# Patient Record
Sex: Female | Born: 1990 | Race: Black or African American | Hispanic: No | Marital: Single | Smoking: Never smoker
Health system: Southern US, Community
[De-identification: ages and names within clinical notes are randomized; demographics above are authoritative.]

## PROBLEM LIST (undated history)

## (undated) ENCOUNTER — Inpatient Hospital Stay (HOSPITAL_COMMUNITY): Payer: Self-pay

## (undated) ENCOUNTER — Ambulatory Visit (HOSPITAL_COMMUNITY): Discharge status: Against Medical Advice | Payer: Medicaid Other

## (undated) DIAGNOSIS — D649 Anemia, unspecified: Secondary | ICD-10-CM

## (undated) DIAGNOSIS — F419 Anxiety disorder, unspecified: Secondary | ICD-10-CM

## (undated) DIAGNOSIS — R112 Nausea with vomiting, unspecified: Secondary | ICD-10-CM

## (undated) DIAGNOSIS — Z9889 Other specified postprocedural states: Secondary | ICD-10-CM

## (undated) DIAGNOSIS — K08409 Partial loss of teeth, unspecified cause, unspecified class: Secondary | ICD-10-CM

## (undated) DIAGNOSIS — Z8659 Personal history of other mental and behavioral disorders: Secondary | ICD-10-CM

## (undated) DIAGNOSIS — F32A Depression, unspecified: Secondary | ICD-10-CM

## (undated) DIAGNOSIS — Z8619 Personal history of other infectious and parasitic diseases: Secondary | ICD-10-CM

## (undated) DIAGNOSIS — Z8759 Personal history of other complications of pregnancy, childbirth and the puerperium: Secondary | ICD-10-CM

## (undated) HISTORY — PX: NO PAST SURGERIES: SHX2092

## (undated) HISTORY — DX: Anxiety disorder, unspecified: F41.9

## (undated) HISTORY — PX: TONSILLECTOMY: SUR1361

---
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
## 2010-04-06 ENCOUNTER — Emergency Department (HOSPITAL_COMMUNITY): Admission: EM | Admit: 2010-04-06 | Discharge: 2010-04-06 | Payer: Self-pay | Admitting: Emergency Medicine

## 2010-10-12 ENCOUNTER — Emergency Department (HOSPITAL_COMMUNITY): Admission: EM | Admit: 2010-10-12 | Discharge: 2010-10-12 | Payer: Self-pay | Admitting: Emergency Medicine

## 2011-01-01 ENCOUNTER — Emergency Department (HOSPITAL_COMMUNITY)
Admission: EM | Admit: 2011-01-01 | Discharge: 2011-01-01 | Payer: Self-pay | Source: Home / Self Care | Admitting: Family Medicine

## 2011-01-08 LAB — POCT URINALYSIS DIPSTICK
Bilirubin Urine: NEGATIVE
Hgb urine dipstick: NEGATIVE
Ketones, ur: NEGATIVE mg/dL
Nitrite: NEGATIVE
Protein, ur: 30 mg/dL — AB
Specific Gravity, Urine: 1.015 (ref 1.005–1.030)
Urine Glucose, Fasting: NEGATIVE mg/dL
Urobilinogen, UA: 2 mg/dL — ABNORMAL HIGH (ref 0.0–1.0)
pH: 7.5 (ref 5.0–8.0)

## 2011-01-08 LAB — POCT I-STAT, CHEM 8
BUN: <3 mg/dL — ABNORMAL LOW (ref 6–23)
Calcium, Ion: 1.19 mmol/L (ref 1.12–1.32)
Chloride: 101 mEq/L (ref 96–112)
Creatinine, Ser: 0.6 mg/dL (ref 0.4–1.2)
Glucose, Bld: 72 mg/dL (ref 70–99)
HCT: 40 % (ref 36.0–46.0)
Hemoglobin: 13.6 g/dL (ref 12.0–15.0)
Potassium: 3.7 mEq/L (ref 3.5–5.1)
Sodium: 142 mEq/L (ref 135–145)
TCO2: 31 mmol/L (ref 0–100)

## 2011-01-08 LAB — POCT PREGNANCY, URINE
Preg Test, Ur: NEGATIVE
Preg Test, Ur: NEGATIVE

## 2011-01-08 LAB — TSH: TSH: 1.2 u[IU]/mL (ref 0.350–4.500)

## 2011-03-14 LAB — DIFFERENTIAL
Basophils Absolute: 0 10*3/uL (ref 0.0–0.1)
Basophils Relative: 0 % (ref 0–1)
Eosinophils Absolute: 1 10*3/uL — ABNORMAL HIGH (ref 0.0–0.7)
Eosinophils Relative: 12 % — ABNORMAL HIGH (ref 0–5)
Lymphocytes Relative: 22 % (ref 12–46)
Lymphs Abs: 1.8 10*3/uL (ref 0.7–4.0)
Monocytes Absolute: 0.3 10*3/uL (ref 0.1–1.0)
Monocytes Relative: 4 % (ref 3–12)
Neutro Abs: 5 10*3/uL (ref 1.7–7.7)
Neutrophils Relative %: 62 % (ref 43–77)

## 2011-03-14 LAB — CBC
HCT: 37 % (ref 36.0–46.0)
Hemoglobin: 12.1 g/dL (ref 12.0–15.0)
MCHC: 32.8 g/dL (ref 30.0–36.0)
MCV: 90.4 fL (ref 78.0–100.0)
Platelets: 262 10*3/uL (ref 150–400)
RBC: 4.09 MIL/uL (ref 3.87–5.11)
RDW: 13.8 % (ref 11.5–15.5)
WBC: 8.1 10*3/uL (ref 4.0–10.5)

## 2011-03-14 LAB — POCT PREGNANCY, URINE: Preg Test, Ur: NEGATIVE

## 2011-03-19 ENCOUNTER — Inpatient Hospital Stay (INDEPENDENT_AMBULATORY_CARE_PROVIDER_SITE_OTHER)
Admission: RE | Admit: 2011-03-19 | Discharge: 2011-03-19 | Disposition: A | Discharge status: Home / Self Care | Payer: Self-pay | Source: Ambulatory Visit | Attending: Family Medicine | Admitting: Family Medicine

## 2011-03-19 DIAGNOSIS — J309 Allergic rhinitis, unspecified: Secondary | ICD-10-CM

## 2011-03-19 LAB — POCT PREGNANCY, URINE: Preg Test, Ur: NEGATIVE

## 2011-08-09 ENCOUNTER — Inpatient Hospital Stay (INDEPENDENT_AMBULATORY_CARE_PROVIDER_SITE_OTHER)
Admission: RE | Admit: 2011-08-09 | Discharge: 2011-08-09 | Disposition: A | Discharge status: Home / Self Care | Payer: 59 | Source: Ambulatory Visit | Attending: Emergency Medicine | Admitting: Emergency Medicine

## 2011-08-09 DIAGNOSIS — Z331 Pregnant state, incidental: Secondary | ICD-10-CM

## 2011-08-09 LAB — POCT PREGNANCY, URINE: Preg Test, Ur: POSITIVE

## 2011-08-28 ENCOUNTER — Encounter (HOSPITAL_COMMUNITY): Payer: Self-pay

## 2011-08-28 ENCOUNTER — Inpatient Hospital Stay (HOSPITAL_COMMUNITY)
Admission: AD | Admit: 2011-08-28 | Discharge: 2011-08-28 | Disposition: A | Discharge status: Home / Self Care | Payer: 59 | Source: Ambulatory Visit | Attending: Obstetrics | Admitting: Obstetrics

## 2011-08-28 ENCOUNTER — Inpatient Hospital Stay (HOSPITAL_COMMUNITY): Payer: 59

## 2011-08-28 DIAGNOSIS — O219 Vomiting of pregnancy, unspecified: Secondary | ICD-10-CM

## 2011-08-28 DIAGNOSIS — Z3400 Encounter for supervision of normal first pregnancy, unspecified trimester: Secondary | ICD-10-CM

## 2011-08-28 DIAGNOSIS — O99891 Other specified diseases and conditions complicating pregnancy: Secondary | ICD-10-CM | POA: Insufficient documentation

## 2011-08-28 DIAGNOSIS — O210 Mild hyperemesis gravidarum: Secondary | ICD-10-CM | POA: Insufficient documentation

## 2011-08-28 DIAGNOSIS — J45909 Unspecified asthma, uncomplicated: Secondary | ICD-10-CM | POA: Insufficient documentation

## 2011-08-28 LAB — ABO/RH: ABO/RH(D): A POS

## 2011-08-28 LAB — CBC
HCT: 33.8 % — ABNORMAL LOW (ref 36.0–46.0)
Hemoglobin: 11.4 g/dL — ABNORMAL LOW (ref 12.0–15.0)
MCH: 29.1 pg (ref 26.0–34.0)
MCHC: 33.7 g/dL (ref 30.0–36.0)
MCV: 86.2 fL (ref 78.0–100.0)
Platelets: 265 10*3/uL (ref 150–400)
RBC: 3.92 MIL/uL (ref 3.87–5.11)
RDW: 12.5 % (ref 11.5–15.5)
WBC: 6.7 10*3/uL (ref 4.0–10.5)

## 2011-08-28 LAB — WET PREP, GENITAL
Clue Cells Wet Prep HPF POC: NONE SEEN
Trich, Wet Prep: NONE SEEN

## 2011-08-28 LAB — URINALYSIS, ROUTINE W REFLEX MICROSCOPIC
Bilirubin Urine: NEGATIVE
Glucose, UA: NEGATIVE mg/dL
Hgb urine dipstick: NEGATIVE
Ketones, ur: >80 mg/dL — AB
Leukocytes, UA: NEGATIVE
Nitrite: NEGATIVE
Protein, ur: NEGATIVE mg/dL
Specific Gravity, Urine: >1.03 — ABNORMAL HIGH (ref 1.005–1.030)
Urobilinogen, UA: 0.2 mg/dL (ref 0.0–1.0)
pH: 6 (ref 5.0–8.0)

## 2011-08-28 LAB — HCG, QUANTITATIVE, PREGNANCY: hCG, Beta Chain, Quant, S: 109233 m[IU]/mL — ABNORMAL HIGH (ref <5)

## 2011-08-28 MED ORDER — ONDANSETRON 8 MG PO TBDP
8.0000 mg | ORAL_TABLET | Freq: Once | ORAL | Status: AC
  Administered 2011-08-28: 8 mg via ORAL
  Filled 2011-08-28: qty 1

## 2011-08-28 NOTE — Progress Notes (Signed)
Pt states that she has had a constant pain in the R mid abdomen above the waist level on her side for about 24 hours. Nausea, vomited only once 9-3. Slight white discharge.

## 2011-08-28 NOTE — ED Provider Notes (Signed)
History     Chief Complaint  Patient presents with  . Abdominal Pain   HPI Bailey Bailey 20 y.o. LMP 06-04-11   Comes for evaluation of right side pain which is intermittent.  Last episode of pain was while she was sitting in the lobby.  Has first prenatal care visit tomorrow.  Has been vomiting today and has not had any food today.   OB History    Grav Para Term Preterm Abortions TAB SAB Ect Mult Living   1               Past Medical History  Diagnosis Date  . Asthma     Past Surgical History  Procedure Date  . No past surgeries     No family history on file.  History  Substance Use Topics  . Smoking status: Never Smoker   . Smokeless tobacco: Not on file  . Alcohol Use: No    Allergies: Allergies not on file  No prescriptions prior to admission    Review of Systems  Gastrointestinal: Positive for nausea, vomiting and abdominal pain.  Genitourinary:       No vaginal discharge. No vaginal bleeding. No dysuria.     Physical Exam   Blood pressure 112/68, pulse 82, temperature 98.7 F (37.1 C), temperature source Oral, resp. rate 16, height 5\' 5"  (1.651 m), weight 163 lb 6.4 oz (74.118 kg), last menstrual period 06/04/2011, SpO2 100.00%.  Physical Exam  Nursing note and vitals reviewed. Constitutional: She is oriented to person, place, and time. She appears well-developed and well-nourished.  HENT:  Head: Normocephalic.  Eyes: EOM are normal.  Neck: Neck supple.  GI: Soft. There is no tenderness. There is no rebound and no guarding.       Unable to palpate fundus and unable to hear FHT with doppler  Genitourinary:       Speculum exam: Vulva:  Small fissure noted at intersection of mons and labia majora - HSV culture done Vagina - Small amount of creamy discharge, no odor Cervix - No contact bleeding Bimanual exam: Cervix closed Uterus non tender, normal size Adnexa non tender, no masses bilaterally GC/Chlam, wet prep done Chaperone present for  exam.    Musculoskeletal: Normal range of motion.  Neurological: She is alert and oriented to person, place, and time.  Skin: Skin is warm and dry.  Psychiatric: She has a normal mood and affect.  Client now states she is unsure of LMP - ?  July 11 vs.  June 11.  MAU Course  Procedures  *RADIOLOGY REPORT*  Clinical Data: Intermittent right side pain. By LMP, the patient is  7 weeks 6 days with EDC by LMP of 04/09/2012.  OBSTETRIC <14 WK [place] AND TRANSVAGINAL OB US  Technique: Both transabdominal and transvaginal ultrasound  examinations were performed for complete evaluation of the  gestation as well as the maternal uterus, adnexal regions, and  pelvic cul-de-sac. Transvaginal technique was performed to assess  early pregnancy.  Comparison: None.  Intrauterine gestational sac: Visualized/normal in shape.  Yolk sac: Present  Embryo: Present  Cardiac Activity: Present  Heart Rate: 170 bpm  CRL: 16.7 mm 8 w 0 d [place] EDC: 04/08/2012  Maternal uterus/adnexae:  The cervix has a normal appearance by transvaginal exam. The left  ovary is normal appearance. The right ovary is normal in  appearance. Within the left adnexal region, there is a 1.9 x 0.9 x  1.0 simple cyst, separate from the ovary. This may represent  a  peritoneal inclusion cyst or bowel duplication cyst. Alternatively,  this could represent a para ovarian cyst.  IMPRESSION:  1. Single living intrauterine embryo corresponding to an age of 7  weeks 6 days. Size of these correlate well.  2. Incidental note of benign appearing left adnexal simple cyst,  discrete from the ovary.   MDM   Assessment and Plan  Viable pregnancy  Plan: Keep your appointment to begin prenatal care tomorrow. Let your provider know you were here tonight.  Sol Odor 08/28/2011, 8:34 PM   Nolene Bernheim, NP 08/28/11 2308

## 2011-08-29 LAB — GC/CHLAMYDIA PROBE AMP, GENITAL
Chlamydia, DNA Probe: NEGATIVE
GC Probe Amp, Genital: NEGATIVE

## 2011-08-29 LAB — RPR: RPR: NONREACTIVE

## 2011-08-29 LAB — RUBELLA ANTIBODY, IGM: Rubella: IMMUNE

## 2011-08-31 LAB — HERPES SIMPLEX VIRUS CULTURE: Culture: NOT DETECTED

## 2011-10-06 DIAGNOSIS — N949 Unspecified condition associated with female genital organs and menstrual cycle: Secondary | ICD-10-CM

## 2011-10-06 DIAGNOSIS — K5900 Constipation, unspecified: Secondary | ICD-10-CM

## 2011-11-07 ENCOUNTER — Encounter (HOSPITAL_COMMUNITY): Payer: Self-pay | Admitting: *Deleted

## 2011-11-07 ENCOUNTER — Inpatient Hospital Stay (HOSPITAL_COMMUNITY)
Admission: AD | Admit: 2011-11-07 | Discharge: 2011-11-07 | Disposition: A | Discharge status: Home / Self Care | Payer: 59 | Source: Ambulatory Visit | Attending: Obstetrics and Gynecology | Admitting: Obstetrics and Gynecology

## 2011-11-07 DIAGNOSIS — O99891 Other specified diseases and conditions complicating pregnancy: Secondary | ICD-10-CM | POA: Insufficient documentation

## 2011-11-07 DIAGNOSIS — N949 Unspecified condition associated with female genital organs and menstrual cycle: Secondary | ICD-10-CM

## 2011-11-07 DIAGNOSIS — K5900 Constipation, unspecified: Secondary | ICD-10-CM | POA: Insufficient documentation

## 2011-11-07 DIAGNOSIS — R1032 Left lower quadrant pain: Secondary | ICD-10-CM | POA: Insufficient documentation

## 2011-11-07 NOTE — Progress Notes (Signed)
Pt c/o LLQ pain since yesterday, hurts more with movement. No vaginal bleeding. Pt states she hasn't had a BM in "3 weeks".

## 2011-11-07 NOTE — ED Provider Notes (Signed)
History     Chief Complaint  Patient presents with  . Abdominal Pain   HPI Bailey Bailey 20 y.o. 18w 0d Comes to MAU tonight with periodic stabbing LLQ pain which has now resolved.  Has prenatal care appointment in the office tomorrow.  Currently feels fine and wants to go home.  Has taken Zofran earlier in her pregnancy and has not had a BM in 2 weeks.   OB History    Grav Para Term Preterm Abortions TAB SAB Ect Mult Living   1               Past Medical History  Diagnosis Date  . Asthma   . No pertinent past medical history     Past Surgical History  Procedure Date  . No past surgeries     No family history on file.  History  Substance Use Topics  . Smoking status: Never Smoker   . Smokeless tobacco: Not on file  . Alcohol Use: No    Allergies: No Known Allergies  Prescriptions prior to admission  Medication Sig Dispense Refill  . Prenatal Vit-Fe Fumarate-FA (PRENAPLUS PO) Take 1 tablet by mouth daily.        Marland Kitchen acetaminophen (TYLENOL) 500 MG tablet Take 500-1,000 mg by mouth every 6 (six) hours as needed. For pain      . albuterol (PROVENTIL HFA;VENTOLIN HFA) 108 (90 BASE) MCG/ACT inhaler Inhale 2 puffs into the lungs every 6 (six) hours as needed.          Review of Systems  Gastrointestinal: Positive for constipation. Negative for abdominal pain.  Genitourinary: Negative for dysuria.       No vaginal bleeding, no vaginal discharge   Physical Exam   Blood pressure 110/85, pulse 74, temperature 97.8 F (36.6 C), temperature source Oral, resp. rate 20, height 5\' 6"  (1.676 m), weight 140 lb (63.504 kg), last menstrual period 06/04/2011.  Physical Exam  Nursing note and vitals reviewed. Constitutional: She is oriented to person, place, and time. She appears well-developed and well-nourished.  HENT:  Head: Normocephalic.  Eyes: EOM are normal.  Neck: Neck supple.  GI: Soft. There is no tenderness.       Fundus 1 FB below umbilicus, FHT 156    Musculoskeletal: Normal range of motion.  Neurological: She is alert and oriented to person, place, and time.  Skin: Skin is warm and dry.  Psychiatric: She has a normal mood and affect.    MAU Course  Procedures  MDM Pain has resolved currently  Assessment and Plan  Round ligament pain Constipation  Plan Keep appointment in the office tomorrow Call your doctor if you have questions or concerns. You may want to take Miralax by the package directions to assist with having a BM.  BURLESON,TERRI 11/07/2011, 8:29 PM   Nolene Bernheim, NP 11/07/11 2034

## 2011-12-26 ENCOUNTER — Encounter (HOSPITAL_COMMUNITY): Payer: Self-pay | Admitting: *Deleted

## 2011-12-26 ENCOUNTER — Inpatient Hospital Stay (HOSPITAL_COMMUNITY)
Admission: AD | Admit: 2011-12-26 | Discharge: 2011-12-26 | Disposition: A | Discharge status: Home / Self Care | Payer: 59 | Source: Ambulatory Visit | Attending: Obstetrics and Gynecology | Admitting: Obstetrics and Gynecology

## 2011-12-26 DIAGNOSIS — R109 Unspecified abdominal pain: Secondary | ICD-10-CM | POA: Insufficient documentation

## 2011-12-26 DIAGNOSIS — O99891 Other specified diseases and conditions complicating pregnancy: Secondary | ICD-10-CM | POA: Insufficient documentation

## 2011-12-26 DIAGNOSIS — O26899 Other specified pregnancy related conditions, unspecified trimester: Secondary | ICD-10-CM

## 2011-12-26 NOTE — Progress Notes (Signed)
Patient state she has been having sharp pain on the right abdomen. Is worse at night and with movement, but today has had two episodes of severe sharp pain not related to any activity. Patient denies any bleeding or leaking and reports good fetal movement.

## 2011-12-26 NOTE — ED Provider Notes (Signed)
History     Chief Complaint  Patient presents with  . Abdominal Pain   HPI Bailey Bailey 21 y.o. 25w 0d  Was having periodic pain in her sides earlier, but it has now stopped and she wants to go home.  Denies pain, denies dysuria.  Next appointment in the office is on the 14 or 16th.  Did not call the office today.  Has not taken any medication for pain today.  OB History    Grav Para Term Preterm Abortions TAB SAB Ect Mult Living   1               Past Medical History  Diagnosis Date  . Asthma   . No pertinent past medical history     Past Surgical History  Procedure Date  . No past surgeries     History reviewed. No pertinent family history.  History  Substance Use Topics  . Smoking status: Never Smoker   . Smokeless tobacco: Never Used  . Alcohol Use: No    Allergies: No Known Allergies  Prescriptions prior to admission  Medication Sig Dispense Refill  . Prenatal Vit-Fe Fumarate-FA (PRENATAL MULTIVITAMIN) TABS Take 1 tablet by mouth daily.        Marland Kitchen albuterol (PROVENTIL HFA;VENTOLIN HFA) 108 (90 BASE) MCG/ACT inhaler Inhale 2 puffs into the lungs every 6 (six) hours as needed. For shortness of breath/asthma        Review of Systems  Gastrointestinal: Negative for nausea, vomiting and diarrhea.  Genitourinary: Negative for dysuria.       Periodic pain in sides of abdomen No vaginal bleeding No leaking   Physical Exam   Blood pressure 114/60, pulse 102, temperature 98.8 F (37.1 C), temperature source Oral, resp. rate 16, height 5' 5.5" (1.664 m), weight 160 lb 12.8 oz (72.938 kg), last menstrual period 06/04/2011, SpO2 97.00%.  Physical Exam  Nursing note and vitals reviewed. Constitutional: She is oriented to person, place, and time. She appears well-developed and well-nourished.  HENT:  Head: Normocephalic.  Eyes: EOM are normal.  Neck: Neck supple.  GI: Soft. There is no tenderness.       FHT baseline 150.  Feeling good movement of baby.  No  contractions palpated or on the monitor strip.  Musculoskeletal: Normal range of motion.  Neurological: She is alert and oriented to person, place, and time.  Skin: Skin is warm and dry.  Psychiatric: She has a normal mood and affect.    MAU Course  Procedures  MDM Client's pain has resolved.  Consult with Dr. Marcelle Overlie re: plan of care  Assessment and Plan  Pregnancy 25 weeks  Plan Advised to call the office if pain is occuring during work week, daytime hours May also try pain relief measures - increased PO fluids, Tylenol po by package directions and call the office if continuing to have problems.  Bailey Bailey 12/26/2011, 4:31 PM   Nolene Bernheim, NP 12/26/11 1647

## 2012-01-14 ENCOUNTER — Inpatient Hospital Stay (HOSPITAL_COMMUNITY)
Admission: AD | Admit: 2012-01-14 | Discharge: 2012-01-14 | Disposition: A | Discharge status: Home / Self Care | Payer: 59 | Source: Ambulatory Visit | Attending: Obstetrics and Gynecology | Admitting: Obstetrics and Gynecology

## 2012-01-14 ENCOUNTER — Encounter (HOSPITAL_COMMUNITY): Payer: Self-pay | Admitting: *Deleted

## 2012-01-14 ENCOUNTER — Inpatient Hospital Stay (HOSPITAL_COMMUNITY): Payer: 59

## 2012-01-14 DIAGNOSIS — B3731 Acute candidiasis of vulva and vagina: Secondary | ICD-10-CM | POA: Insufficient documentation

## 2012-01-14 DIAGNOSIS — O2390 Unspecified genitourinary tract infection in pregnancy, unspecified trimester: Secondary | ICD-10-CM | POA: Insufficient documentation

## 2012-01-14 DIAGNOSIS — O26859 Spotting complicating pregnancy, unspecified trimester: Secondary | ICD-10-CM | POA: Insufficient documentation

## 2012-01-14 DIAGNOSIS — B373 Candidiasis of vulva and vagina: Secondary | ICD-10-CM

## 2012-01-14 HISTORY — DX: Anemia, unspecified: D64.9

## 2012-01-14 LAB — WET PREP, GENITAL
Clue Cells Wet Prep HPF POC: NONE SEEN
Trich, Wet Prep: NONE SEEN

## 2012-01-14 MED ORDER — FLUCONAZOLE 150 MG PO TABS: 150.0000 mg | ORAL_TABLET | Freq: Once | ORAL | Status: AC

## 2012-01-14 NOTE — Progress Notes (Signed)
Patient states she had a some pain last night that was more aching than cramping but it stopped. This am with wiping had a pink discharge on the tissue. No pain at this time. Reports movement normal for her baby for the morning.

## 2012-01-14 NOTE — Discharge Instructions (Signed)
Candida Infection, Adult A candida infection (also called yeast, fungus and Monilia infection) is an overgrowth of yeast that can occur anywhere on the body. A yeast infection commonly occurs in warm, moist body areas. Usually, the infection remains localized but can spread to become a systemic infection. A yeast infection may be a sign of a more severe disease such as diabetes, leukemia, or AIDS. A yeast infection can occur in both men and women. In women, Candida vaginitis is a vaginal infection. It is one of the most common causes of vaginitis. Men usually do not have symptoms or know they have an infection until other problems develop. Men may find out they have a yeast infection because their sex partner has a yeast infection. Uncircumcised men are more likely to get a yeast infection than circumcised men. This is because the uncircumcised glans is not exposed to air and does not remain as dry as that of a circumcised glans. Older adults may develop yeast infections around dentures. CAUSES  Women  Antibiotics.   Steroid medication taken for a long time.   Being overweight (obese).   Diabetes.   Poor immune condition.   Certain serious medical conditions.   Immune suppressive medications for organ transplant patients.   Chemotherapy.   Pregnancy.   Menstration.   Stress and fatigue.   Intravenous drug use.   Oral contraceptives.   Wearing tight-fitting clothes in the crotch area.   Catching it from a sex partner who has a yeast infection.   Spermicide.   Intravenous, urinary, or other catheters.  Men  Catching it from a sex partner who has a yeast infection.   Having oral or anal sex with a person who has the infection.   Spermicide.   Diabetes.   Antibiotics.   Poor immune system.   Medications that suppress the immune system.   Intravenous drug use.   Intravenous, urinary, or other catheters.  SYMPTOMS  Women  Thick, white vaginal discharge.    Vaginal itching.   Redness and swelling in and around the vagina.   Irritation of the lips of the vagina and perineum.   Blisters on the vaginal lips and perineum.   Painful sexual intercourse.   Low blood sugar (hypoglycemia).   Painful urination.   Bladder infections.   Intestinal problems such as constipation, indigestion, bad breath, bloating, increase in gas, diarrhea, or loose stools.  Men  Men may develop intestinal problems such as constipation, indigestion, bad breath, bloating, increase in gas, diarrhea, or loose stools.   Dry, cracked skin on the penis with itching or discomfort.   Jock itch.   Dry, flaky skin.   Athlete's foot.   Hypoglycemia.  DIAGNOSIS  Women  A history and an exam are performed.   The discharge may be examined under a microscope.   A culture may be taken of the discharge.  Men  A history and an exam are performed.   Any discharge from the penis or areas of cracked skin will be looked at under the microscope and cultured.   Stool samples may be cultured.  TREATMENT  Women  Vaginal antifungal suppositories and creams.   Medicated creams to decrease irritation and itching on the outside of the vagina.   Warm compresses to the perineal area to decrease swelling and discomfort.   Oral antifungal medications.   Medicated vaginal suppositories or cream for repeated or recurrent infections.   Wash and dry the irritation areas before applying the cream.     Eating yogurt with lactobacillus may help with prevention and treatment.   Sometimes painting the vagina with gentian violet solution may help if creams and suppositories do not work.  Men  Antifungal creams and oral antifungal medications.   Sometimes treatment must continue for 30 days after the symptoms go away to prevent recurrence.  HOME CARE INSTRUCTIONS  Women  Use cotton underwear and avoid tight-fitting clothing.   Avoid colored, scented toilet paper and  deodorant tampons or pads.   Do not douche.   Keep your diabetes under control.   Finish all the prescribed medications.   Keep your skin clean and dry.   Consume milk or yogurt with lactobacillus active culture regularly. If you get frequent yeast infections and think that is what the infection is, there are over-the-counter medications that you can get. If the infection does not show healing in 3 days, talk to your caregiver.   Tell your sex partner you have a yeast infection. Your partner may need treatment also, especially if your infection does not clear up or recurs.  Men  Keep your skin clean and dry.   Keep your diabetes under control.   Finish all prescribed medications.   Tell your sex partner that you have a yeast infection so they can be treated if necessary.  SEEK MEDICAL CARE IF:   Your symptoms do not clear up or worsen in one week after treatment.   You have an oral temperature above 102 F (38.9 C).   You have trouble swallowing or eating for a prolonged time.   You develop blisters on and around your vagina.   You develop vaginal bleeding and it is not your menstrual period.   You develop abdominal pain.   You develop intestinal problems as mentioned above.   You get weak or lightheaded.   You have painful or increased urination.   You have pain during sexual intercourse.  MAKE SURE YOU:   Understand these instructions.   Will watch your condition.   Will get help right away if you are not doing well or get worse.  Document Released: 01/17/2005 Document Revised: 08/22/2011 Document Reviewed: 05/01/2010 [employer] Patient Information 2012 [employer]. 

## 2012-01-14 NOTE — ED Provider Notes (Signed)
Bailey Bailey y.o.G1P0 @[redacted]w[redacted]d   SUBJECTIVE  HPI: Brought in by her mother for pink vaginal spotting this morning. Patient states that last night she had diffuse lower abdominal crampy pain for several hours but has no abdominal pain or cramps presently. This is the first episode of any vaginal bleeding. She states that her perineum has been moist and panties slightly wet for weeks but no gush of fluid. No antecedent intercourse. She has whitish vaginal discharge that is pruritic.Good fetal movement. No dysuria or hematuria.  Regular prenatal care with no complications.  Past Medical History  Diagnosis Date  . Asthma   . Anemia    Past Surgical History  Procedure Date  . No past surgeries    History   Social History  . Marital Status: Single    Spouse Name: N/A    Number of Children: N/A  . Years of Education: N/A   Occupational History  . Not on file.   Social History Main Topics  . Smoking status: Never Smoker   . Smokeless tobacco: Never Used  . Alcohol Use: No  . Drug Use: No  . Sexually Active: Not Currently     last intercourse months ago   Other Topics Concern  . Not on file   Social History Narrative  . No narrative on file   No current facility-administered medications on file prior to encounter.   Current Outpatient Prescriptions on File Prior to Encounter  Medication Sig Dispense Refill  . albuterol (PROVENTIL HFA;VENTOLIN HFA) 108 (90 BASE) MCG/ACT inhaler Inhale 2 puffs into the lungs every 6 (six) hours as needed. For shortness of breath/asthma      . Prenatal Vit-Fe Fumarate-FA (PRENATAL MULTIVITAMIN) TABS Take 1 tablet by mouth at bedtime.        No Known Allergies  ROS: Pertinent items in HPI  OBJECTIVE  BP 110/56  Pulse 88  Temp(Src) 98.9 F (37.2 C) (Oral)  Resp 16  Ht 5' 5.5" (1.664 m)  SpO2 99%  LMP 06/04/2011 Physical Exam  Constitutional: She is oriented to person, place, and time and well-developed, well-nourished, and in no  distress.  HENT:  Head: Normocephalic.  Neck: Neck supple.  Cardiovascular: Normal rate.   Pulmonary/Chest: Effort normal.  Abdominal: Soft. There is no tenderness.       Size consistent with dates  Genitourinary: Vaginal discharge found.       NEFG. Then vaginal discharge at introitus; fern negative. Speculum: Moderate amount adherent whitish yellow vaginal discharge swabbed from vault and cervix. Initially no blood seen cervix friable when touched with Q-tip. No lesion seen Cx: closed, 2.5 cm long  Musculoskeletal: Normal range of motion.  Neurological: She is alert and oriented to person, place, and time.  Skin: Skin is warm and dry.   Results for orders placed during the hospital encounter of 01/14/12 (from the past 24 hour(s))  WET PREP, GENITAL     Status: Abnormal   Collection Time   01/14/12  9:32 AM      Component Value Range   Yeast, Wet Prep MODERATE (*) NONE SEEN    Trich, Wet Prep NONE SEEN  NONE SEEN    Clue Cells, Wet Prep NONE SEEN  NONE SEEN    WBC, Wet Prep HPF POC FEW (*) NONE SEEN    Toco: No UCs FHR 145, 10bpm ACCELS  [place]: normal AFV, cx 3.5, no previa or abruption  ASSESSMENT G1 at [redacted]w[redacted]d Candida vaginitis with friable cx    PLAN Rx Diflucan  GC/CT sent Keep appt at office or cal office if bleeding recurs  Discussed with Dr. Marcelle Overlie

## 2012-03-06 ENCOUNTER — Encounter (HOSPITAL_COMMUNITY): Payer: Self-pay | Admitting: *Deleted

## 2012-03-06 ENCOUNTER — Inpatient Hospital Stay (HOSPITAL_COMMUNITY)
Admission: AD | Admit: 2012-03-06 | Discharge: 2012-03-06 | Disposition: A | Discharge status: Home / Self Care | Payer: 59 | Source: Ambulatory Visit | Attending: Obstetrics and Gynecology | Admitting: Obstetrics and Gynecology

## 2012-03-06 DIAGNOSIS — R1031 Right lower quadrant pain: Secondary | ICD-10-CM | POA: Insufficient documentation

## 2012-03-06 DIAGNOSIS — N949 Unspecified condition associated with female genital organs and menstrual cycle: Secondary | ICD-10-CM

## 2012-03-06 DIAGNOSIS — O99891 Other specified diseases and conditions complicating pregnancy: Secondary | ICD-10-CM | POA: Insufficient documentation

## 2012-03-06 LAB — URINALYSIS, ROUTINE W REFLEX MICROSCOPIC
Bilirubin Urine: NEGATIVE
Glucose, UA: NEGATIVE mg/dL
Hgb urine dipstick: NEGATIVE
Ketones, ur: NEGATIVE mg/dL
Protein, ur: NEGATIVE mg/dL

## 2012-03-06 NOTE — MAU Note (Signed)
Pt reports "sharp, sharp abd pain"  Off/on since yesterday. Denies problems with preg. Reports good fetal movement. Denies vaginal bleeding or fluid.

## 2012-03-06 NOTE — MAU Note (Signed)
Pt states she has been having pain since yesterday.Pt states she has the pain all the time, but it "is getting worse and worse"

## 2012-03-06 NOTE — MAU Provider Note (Signed)
Chief Complaint:  Abdominal Pain   Bailey Bailey is  21 y.o. G1P0.  Patient's last menstrual period was 06/04/2011..  [redacted]w[redacted]d   She presents complaining of Abdominal Pain  Presents with report of sharp shooting lower abd pain present for several days. Worse at night and with position changes. +FM, Denies blding, LOF, dysuria, nausea, vomiting, diarrhea, fever, chills, or trauma  Obstetrical/Gynecological History: OB History    Grav Para Term Preterm Abortions TAB SAB Ect Mult Living   1               Past Medical History: Past Medical History  Diagnosis Date  . Asthma   . Anemia     Past Surgical History: Past Surgical History  Procedure Date  . No past surgeries     Family History: Family History  Problem Relation Age of Onset  . Kidney disease Mother   . Migraines Mother   . Diabetes Father     Social History: History  Substance Use Topics  . Smoking status: Never Smoker   . Smokeless tobacco: Never Used  . Alcohol Use: No    Allergies: No Known Allergies  Prescriptions prior to admission  Medication Sig Dispense Refill  . albuterol (PROVENTIL HFA;VENTOLIN HFA) 108 (90 BASE) MCG/ACT inhaler Inhale 2 puffs into the lungs every 6 (six) hours as needed. For shortness of breath/asthma      . ferrous sulfate 325 (65 FE) MG tablet Take 325 mg by mouth daily with breakfast.      . Prenatal Vit-Fe Fumarate-FA (PRENATAL MULTIVITAMIN) TABS Take 1 tablet by mouth at bedtime.         Review of Systems - Negative except what has been reviewed in HPI  Physical Exam   Blood pressure 116/66, pulse 87, temperature 97.4 F (36.3 C), resp. rate 18, height 5\' 6"  (1.676 m), weight 183 lb (83.008 kg), last menstrual period 06/04/2011, SpO2 100.00%.  General: General appearance - alert, well appearing, and in no distress and oriented to person, place, and time, appears comfortable, smiling Mental status - alert, oriented to person, place, and time, normal mood, behavior,  speech, dress, motor activity, and thought processes, affect appropriate to mood Abdomen - gravid, non tender Focused Gynecological Exam: CERVIX: closed/thick/firm/posterior/vtx ,  FHR: 130, mod variability, +15x15, no decels Toco: occ UI, no ctx  Labs: Recent Results (from the past 24 hour(s))  URINALYSIS, ROUTINE W REFLEX MICROSCOPIC   Collection Time   03/06/12  8:20 PM      Component Value Range   Color, Urine YELLOW  YELLOW    APPearance CLEAR  CLEAR    Specific Gravity, Urine 1.010  1.005 - 1.030    pH 6.5  5.0 - 8.0    Glucose, UA NEGATIVE  NEGATIVE (mg/dL)   Hgb urine dipstick NEGATIVE  NEGATIVE    Bilirubin Urine NEGATIVE  NEGATIVE    Ketones, ur NEGATIVE  NEGATIVE (mg/dL)   Protein, ur NEGATIVE  NEGATIVE (mg/dL)   Urobilinogen, UA 0.2  0.0 - 1.0 (mg/dL)   Nitrite NEGATIVE  NEGATIVE    Leukocytes, UA NEGATIVE  NEGATIVE    Assessment: Round Ligament Pain  Plan: Discharge home Comfort measures reviewed FU as scheduled in the office, with Dr. Marcelle Overlie, as scheduled.  [name] E. 03/06/2012,9:28 PM

## 2012-03-06 NOTE — Discharge Instructions (Signed)

## 2012-03-08 ENCOUNTER — Inpatient Hospital Stay (HOSPITAL_COMMUNITY)
Admission: AD | Admit: 2012-03-08 | Discharge: 2012-03-08 | Disposition: A | Discharge status: Home / Self Care | Payer: 59 | Source: Ambulatory Visit | Attending: Obstetrics and Gynecology | Admitting: Obstetrics and Gynecology

## 2012-03-08 ENCOUNTER — Encounter (HOSPITAL_COMMUNITY): Payer: Self-pay | Admitting: Obstetrics and Gynecology

## 2012-03-08 DIAGNOSIS — Z3689 Encounter for other specified antenatal screening: Secondary | ICD-10-CM

## 2012-03-08 DIAGNOSIS — Z36 Encounter for antenatal screening of mother: Secondary | ICD-10-CM

## 2012-03-08 DIAGNOSIS — O368190 Decreased fetal movements, unspecified trimester, not applicable or unspecified: Secondary | ICD-10-CM | POA: Insufficient documentation

## 2012-03-08 NOTE — Discharge Instructions (Signed)
Pregnancy - Third Trimester The third trimester of pregnancy (the last 3 months) is a period of the most rapid growth for you and your baby. The baby approaches a length of 20 inches and a weight of 6 to 10 pounds. The baby is adding on fat and getting ready for life outside your body. While inside, babies have periods of sleeping and waking, suck their thumbs, and hiccups. You can often feel small contractions of the uterus. This is false labor. It is also called Braxton-Hicks contractions. This is like a practice for labor. The usual problems in this stage of pregnancy include more difficulty breathing, swelling of the hands and feet from water retention, and having to urinate more often because of the uterus and baby pressing on your bladder.  PRENATAL EXAMS  Blood work may continue to be done during prenatal exams. These tests are done to check on your health and the probable health of your baby. Blood work is used to follow your blood levels (hemoglobin). Anemia (low hemoglobin) is common during pregnancy. Iron and vitamins are given to help prevent this. You may also continue to be checked for diabetes. Some of the past blood tests may be done again.   The size of the uterus is measured during each visit. This makes sure your baby is growing properly according to your pregnancy dates.   Your blood pressure is checked every prenatal visit. This is to make sure you are not getting toxemia.   Your urine is checked every prenatal visit for infection, diabetes and protein.   Your weight is checked at each visit. This is done to make sure gains are happening at the suggested rate and that you and your baby are growing normally.   Sometimes, an ultrasound is performed to confirm the position and the proper growth and development of the baby. This is a test done that bounces harmless sound waves off the baby so your caregiver can more accurately determine due dates.   Discuss the type of pain  medication and anesthesia you will have during your labor and delivery.   Discuss the possibility and anesthesia if a Cesarean Section might be necessary.   Inform your caregiver if there is any mental or physical violence at home.  Sometimes, a specialized non-stress test, contraction stress test and biophysical profile are done to make sure the baby is not having a problem. Checking the amniotic fluid surrounding the baby is called an amniocentesis. The amniotic fluid is removed by sticking a needle into the belly (abdomen). This is sometimes done near the end of pregnancy if an early delivery is required. In this case, it is done to help make sure the baby's lungs are mature enough for the baby to live outside of the womb. If the lungs are not mature and it is unsafe to deliver the baby, an injection of cortisone medication is given to the mother 1 to 2 days before the delivery. This helps the baby's lungs mature and makes it safer to deliver the baby. CHANGES OCCURING IN THE THIRD TRIMESTER OF PREGNANCY Your body goes through many changes during pregnancy. They vary from person to person. Talk to your caregiver about changes you notice and are concerned about.  During the last trimester, you have probably had an increase in your appetite. It is normal to have cravings for certain foods. This varies from person to person and pregnancy to pregnancy.   You may begin to get stretch marks on your hips,   abdomen, and breasts. These are normal changes in the body during pregnancy. There are no exercises or medications to take which prevent this change.   Constipation may be treated with a stool softener or adding bulk to your diet. Drinking lots of fluids, fiber in vegetables, fruits, and whole grains are helpful.   Exercising is also helpful. If you have been very active up until your pregnancy, most of these activities can be continued during your pregnancy. If you have been less active, it is helpful  to start an exercise program such as walking. Consult your caregiver before starting exercise programs.   Avoid all smoking, alcohol, un-prescribed drugs, herbs and "street drugs" during your pregnancy. These chemicals affect the formation and growth of the baby. Avoid chemicals throughout the pregnancy to ensure the delivery of a healthy infant.   Backache, varicose veins and hemorrhoids may develop or get worse.   You will tire more easily in the third trimester, which is normal.   The baby's movements may be stronger and more often.   You may become short of breath easily.   Your belly button may stick out.   A yellow discharge may leak from your breasts called colostrum.   You may have a bloody mucus discharge. This usually occurs a few days to a week before labor begins.  HOME CARE INSTRUCTIONS   Keep your caregiver's appointments. Follow your caregiver's instructions regarding medication use, exercise, and diet.   During pregnancy, you are providing food for you and your baby. Continue to eat regular, well-balanced meals. Choose foods such as meat, fish, milk and other low fat dairy products, vegetables, fruits, and whole-grain breads and cereals. Your caregiver will tell you of the ideal weight gain.   A physical sexual relationship may be continued throughout pregnancy if there are no other problems such as early (premature) leaking of amniotic fluid from the membranes, vaginal bleeding, or belly (abdominal) pain.   Exercise regularly if there are no restrictions. Check with your caregiver if you are unsure of the safety of your exercises. Greater weight gain will occur in the last 2 trimesters of pregnancy. Exercising helps:   Control your weight.   Get you in shape for labor and delivery.   You lose weight after you deliver.   Rest a lot with legs elevated, or as needed for leg cramps or low back pain.   Wear a good support or jogging bra for breast tenderness during  pregnancy. This may help if worn during sleep. Pads or tissues may be used in the bra if you are leaking colostrum.   Do not use hot tubs, steam rooms, or saunas.   Wear your seat belt when driving. This protects you and your baby if you are in an accident.   Avoid raw meat, cat litter boxes and soil used by cats. These carry germs that can cause birth defects in the baby.   It is easier to loose urine during pregnancy. Tightening up and strengthening the pelvic muscles will help with this problem. You can practice stopping your urination while you are going to the bathroom. These are the same muscles you need to strengthen. It is also the muscles you would use if you were trying to stop from passing gas. You can practice tightening these muscles up 10 times a set and repeating this about 3 times per day. Once you know what muscles to tighten up, do not perform these exercises during urination. It is more likely   to cause an infection by backing up the urine.   Ask for help if you have financial, counseling or nutritional needs during pregnancy. Your caregiver will be able to offer counseling for these needs as well as refer you for other special needs.   Make a list of emergency phone numbers and have them available.   Plan on getting help from family or friends when you go home from the hospital.   Make a trial run to the hospital.   Take prenatal classes with the father to understand, practice and ask questions about the labor and delivery.   Prepare the baby's room/nursery.   Do not travel out of the city unless it is absolutely necessary and with the advice of your caregiver.   Wear only low or no heal shoes to have better balance and prevent falling.  MEDICATIONS AND DRUG USE IN PREGNANCY  Take prenatal vitamins as directed. The vitamin should contain 1 milligram of folic acid. Keep all vitamins out of reach of children. Only a couple vitamins or tablets containing iron may be fatal  to a baby or young child when ingested.   Avoid use of all medications, including herbs, over-the-counter medications, not prescribed or suggested by your caregiver. Only take over-the-counter or prescription medicines for pain, discomfort, or fever as directed by your caregiver. Do not use aspirin, ibuprofen (Motrin, Advil, Nuprin) or naproxen (Aleve) unless OK'd by your caregiver.   Let your caregiver also know about herbs you may be using.   Alcohol is related to a number of birth defects. This includes fetal alcohol syndrome. All alcohol, in any form, should be avoided completely. Smoking will cause low birth rate and premature babies.   Street/illegal drugs are very harmful to the baby. They are absolutely forbidden. A baby born to an addicted mother will be addicted at birth. The baby will go through the same withdrawal an adult does.  SEEK MEDICAL CARE IF: You have any concerns or worries during your pregnancy. It is better to call with your questions if you feel they cannot wait, rather than worry about them. DECISIONS ABOUT CIRCUMCISION You may or may not know the sex of your baby. If you know your baby is a boy, it may be time to think about circumcision. Circumcision is the removal of the foreskin of the penis. This is the skin that covers the sensitive end of the penis. There is no proven medical need for this. Often this decision is made on what is popular at the time or based upon religious beliefs and social issues. You can discuss these issues with your caregiver or pediatrician. SEEK IMMEDIATE MEDICAL CARE IF:   An unexplained oral temperature above 102 F (38.9 C) develops, or as your caregiver suggests.   You have leaking of fluid from the vagina (birth canal). If leaking membranes are suspected, take your temperature and tell your caregiver of this when you call.   There is vaginal spotting, bleeding or passing clots. Tell your caregiver of the amount and how many pads are  used.   You develop a bad smelling vaginal discharge with a change in the color from clear to white.   You develop vomiting that lasts more than 24 hours.   You develop chills or fever.   You develop shortness of breath.   You develop burning on urination.   You loose more than 2 pounds of weight or gain more than 2 pounds of weight or as suggested by your   caregiver.   You notice sudden swelling of your face, hands, and feet or legs.   You develop belly (abdominal) pain. Round ligament discomfort is a common non-cancerous (benign) cause of abdominal pain in pregnancy. Your caregiver still must evaluate you.   You develop a severe headache that does not go away.   You develop visual problems, blurred or double vision.   If you have not felt your baby move for more than 1 hour. If you think the baby is not moving as much as usual, eat something with sugar in it and lie down on your left side for an hour. The baby should move at least 4 to 5 times per hour. Call right away if your baby moves less than that.   You fall, are in a car accident or any kind of trauma.   There is mental or physical violence at home.  Document Released: 12/04/2001 Document Revised: 11/29/2011 Document Reviewed: 06/08/2009 [employer] Patient Information 2012 [employer]. 

## 2012-03-08 NOTE — MAU Provider Note (Signed)
Chief Complaint:  Decreased Fetal Movement   HPI  Bailey Bailey is  21 y.o. G1P0 at [redacted]w[redacted]d presents with decreased fetal movment.  She reports that she has not felt the baby move since midnight last night but she has been busy today and stressed.  She denies LOF, vaginal bleeding, cramping/contractions, h/a, visual disturbances, n/v, fever/chills, or urinary symptoms.   Obstetrical/Gynecological History: OB History    Grav Para Term Preterm Abortions TAB SAB Ect Mult Living   1               Past Medical History: Past Medical History  Diagnosis Date  . Asthma   . Anemia     Past Surgical History: Past Surgical History  Procedure Date  . No past surgeries     Family History: Family History  Problem Relation Age of Onset  . Kidney disease Mother   . Migraines Mother   . Diabetes Father   . Heart disease Maternal Grandmother 1    CHF    Social History: History  Substance Use Topics  . Smoking status: Never Smoker   . Smokeless tobacco: Never Used  . Alcohol Use: No    Allergies: No Known Allergies  Meds:  Prescriptions prior to admission  Medication Sig Dispense Refill  . albuterol (PROVENTIL HFA;VENTOLIN HFA) 108 (90 BASE) MCG/ACT inhaler Inhale 2 puffs into the lungs every 6 (six) hours as needed. For shortness of breath/asthma      . ferrous sulfate 325 (65 FE) MG tablet Take 325 mg by mouth daily with breakfast.      . Prenatal Vit-Fe Fumarate-FA (PRENATAL MULTIVITAMIN) TABS Take 1 tablet by mouth at bedtime.         Review of Systems Denies contractions, abdominal pain or vaginal bleeding. See HPI.     Physical Exam  Blood pressure 112/64, pulse 83, temperature 99.1 F (37.3 C), temperature source Oral, resp. rate 18, height 5\' 6"  (1.676 m), weight 83.553 kg (184 lb 3.2 oz), last menstrual period 06/04/2011. GENERAL: Well-developed, well-nourished female in no acute distress.  LUNGS: Clear to auscultation bilaterally.  HEART: Regular rate and  rhythm. ABDOMEN: Soft, nontender, nondistended, gravid.  EXTREMITIES: Nontender, no edema, 2+ distal pulses. Pelvic exam deferred  EFM for 45 minutes: FHR Category I with baseline 135, positive accels, negative decels, moderate variability Some irritability noted on toco, pt denies cramping/contractions   Labs: Not indicated  Imaging Studies:  Not indicated  Assessment/Plan: A: Reactive NST Pt reports good fetal movement in MAU  P: Called Dr Renaldo Fiddler to review pt presentation and NST results D/C home with labor precautions  Teaching done regarding fetal movement counting F/U with prenatal provider Return to MAU as needed    LEFTWICH-KIRBY, Najeh Credit 3/16/20138:40 PM

## 2012-03-08 NOTE — MAU Note (Signed)
"  I haven't felt the baby move since before midnight last night.  I have been very busy today and stressed out."

## 2012-03-17 LAB — STREP B DNA PROBE: GBS: NEGATIVE

## 2012-04-02 ENCOUNTER — Encounter (HOSPITAL_COMMUNITY): Payer: Self-pay | Admitting: *Deleted

## 2012-04-02 ENCOUNTER — Telehealth (HOSPITAL_COMMUNITY): Payer: Self-pay | Admitting: *Deleted

## 2012-04-02 NOTE — Telephone Encounter (Signed)
Preadmission screen  

## 2012-04-10 ENCOUNTER — Telehealth (HOSPITAL_COMMUNITY): Payer: Self-pay | Admitting: *Deleted

## 2012-04-10 NOTE — Telephone Encounter (Signed)
Preadmission screen  

## 2012-04-15 ENCOUNTER — Encounter (HOSPITAL_COMMUNITY): Payer: Self-pay

## 2012-04-15 ENCOUNTER — Inpatient Hospital Stay (HOSPITAL_COMMUNITY)
Admission: RE | Admit: 2012-04-15 | Discharge: 2012-04-19 | DRG: 766 | Disposition: A | Discharge status: Home / Self Care | Payer: 59 | Source: Ambulatory Visit | Attending: Obstetrics and Gynecology | Admitting: Obstetrics and Gynecology

## 2012-04-15 VITALS — BP 107/74 | HR 73 | Temp 97.9°F | Resp 18 | Ht 66.0 in | Wt 190.0 lb

## 2012-04-15 DIAGNOSIS — Z98891 History of uterine scar from previous surgery: Secondary | ICD-10-CM

## 2012-04-15 LAB — CBC
Hemoglobin: 12.5 g/dL (ref 12.0–15.0)
MCH: 29.5 pg (ref 26.0–34.0)
MCV: 91.7 fL (ref 78.0–100.0)
RBC: 4.24 MIL/uL (ref 3.87–5.11)

## 2012-04-15 MED ORDER — CITRIC ACID-SODIUM CITRATE 334-500 MG/5ML PO SOLN
30.0000 mL | ORAL | Status: DC | PRN
  Administered 2012-04-16: 30 mL via ORAL
  Filled 2012-04-15: qty 15

## 2012-04-15 MED ORDER — MISOPROSTOL 25 MCG QUARTER TABLET
25.0000 ug | ORAL_TABLET | ORAL | Status: DC | PRN
  Administered 2012-04-15: 25 ug via VAGINAL
  Filled 2012-04-15 (×2): qty 0.25

## 2012-04-15 MED ORDER — OXYCODONE-ACETAMINOPHEN 5-325 MG PO TABS: 1.0000 | ORAL_TABLET | ORAL | Status: DC | PRN

## 2012-04-15 MED ORDER — ONDANSETRON HCL 4 MG/2ML IJ SOLN: 4.0000 mg | Freq: Four times a day (QID) | INTRAMUSCULAR | Status: DC | PRN

## 2012-04-15 MED ORDER — LACTATED RINGERS IV SOLN
500.0000 mL | INTRAVENOUS | Status: DC | PRN
  Administered 2012-04-16 (×3): 300 mL via INTRAVENOUS

## 2012-04-15 MED ORDER — TERBUTALINE SULFATE 1 MG/ML IJ SOLN
0.2500 mg | Freq: Once | INTRAMUSCULAR | Status: AC | PRN
  Filled 2012-04-15: qty 1

## 2012-04-15 MED ORDER — ZOLPIDEM TARTRATE 10 MG PO TABS
10.0000 mg | ORAL_TABLET | Freq: Every evening | ORAL | Status: DC | PRN
  Administered 2012-04-15: 10 mg via ORAL
  Filled 2012-04-15: qty 1

## 2012-04-15 MED ORDER — ACETAMINOPHEN 325 MG PO TABS: 650.0000 mg | ORAL_TABLET | ORAL | Status: DC | PRN

## 2012-04-15 MED ORDER — LACTATED RINGERS IV SOLN
INTRAVENOUS | Status: DC
  Administered 2012-04-15 – 2012-04-16 (×4): via INTRAVENOUS

## 2012-04-15 MED ORDER — IBUPROFEN 600 MG PO TABS: 600.0000 mg | ORAL_TABLET | Freq: Four times a day (QID) | ORAL | Status: DC | PRN

## 2012-04-15 MED ORDER — OXYTOCIN BOLUS FROM INFUSION
500.0000 mL | Freq: Once | INTRAVENOUS | Status: DC
  Filled 2012-04-15: qty 500

## 2012-04-15 MED ORDER — BUTORPHANOL TARTRATE 2 MG/ML IJ SOLN
1.0000 mg | INTRAMUSCULAR | Status: DC | PRN
  Administered 2012-04-16 (×3): 1 mg via INTRAVENOUS
  Filled 2012-04-15 (×3): qty 1

## 2012-04-15 MED ORDER — OXYTOCIN 20 UNITS IN LACTATED RINGERS INFUSION - SIMPLE: 125.0000 mL/h | Freq: Once | INTRAVENOUS | Status: DC

## 2012-04-15 MED ORDER — FLEET ENEMA 7-19 GM/118ML RE ENEM: 1.0000 | ENEMA | RECTAL | Status: DC | PRN

## 2012-04-15 MED ORDER — LIDOCAINE HCL (PF) 1 % IJ SOLN: 30.0000 mL | INTRAMUSCULAR | Status: DC | PRN

## 2012-04-16 ENCOUNTER — Encounter (HOSPITAL_COMMUNITY): Payer: Self-pay | Admitting: Anesthesiology

## 2012-04-16 ENCOUNTER — Encounter (HOSPITAL_COMMUNITY): Payer: Self-pay

## 2012-04-16 ENCOUNTER — Inpatient Hospital Stay (HOSPITAL_COMMUNITY): Payer: 59 | Admitting: Anesthesiology

## 2012-04-16 ENCOUNTER — Encounter (HOSPITAL_COMMUNITY)
Admission: RE | Disposition: A | Discharge status: Home / Self Care | Payer: Self-pay | Source: Ambulatory Visit | Attending: Obstetrics and Gynecology

## 2012-04-16 LAB — RPR: RPR Ser Ql: NONREACTIVE

## 2012-04-16 SURGERY — Surgical Case
Anesthesia: Regional | Site: Abdomen | Wound class: Clean Contaminated

## 2012-04-16 MED ORDER — OXYTOCIN 10 UNIT/ML IJ SOLN
INTRAMUSCULAR | Status: AC
  Filled 2012-04-16: qty 4

## 2012-04-16 MED ORDER — OXYTOCIN 20 UNITS IN LACTATED RINGERS INFUSION - SIMPLE
1.0000 m[IU]/min | INTRAVENOUS | Status: DC
  Administered 2012-04-16: 2 m[IU]/min via INTRAVENOUS
  Filled 2012-04-16: qty 1000

## 2012-04-16 MED ORDER — MENTHOL 3 MG MT LOZG: 1.0000 | LOZENGE | OROMUCOSAL | Status: DC | PRN

## 2012-04-16 MED ORDER — NALBUPHINE HCL 10 MG/ML IJ SOLN
5.0000 mg | INTRAMUSCULAR | Status: DC | PRN
  Administered 2012-04-16 (×2): 10 mg via INTRAVENOUS
  Filled 2012-04-16 (×2): qty 1

## 2012-04-16 MED ORDER — MEPERIDINE HCL 25 MG/ML IJ SOLN
INTRAMUSCULAR | Status: AC
  Administered 2012-04-16: 6.25 mg via INTRAVENOUS
  Filled 2012-04-16: qty 1

## 2012-04-16 MED ORDER — DIPHENHYDRAMINE HCL 25 MG PO CAPS
25.0000 mg | ORAL_CAPSULE | ORAL | Status: DC | PRN
  Administered 2012-04-17 – 2012-04-18 (×2): 25 mg via ORAL
  Filled 2012-04-16: qty 1

## 2012-04-16 MED ORDER — OXYTOCIN 20 UNITS IN LACTATED RINGERS INFUSION - SIMPLE
INTRAVENOUS | Status: DC | PRN
  Administered 2012-04-16: 20 [IU] via INTRAVENOUS

## 2012-04-16 MED ORDER — MORPHINE SULFATE (PF) 0.5 MG/ML IJ SOLN
INTRAMUSCULAR | Status: DC | PRN
  Administered 2012-04-16: 2 mg via INTRAVENOUS

## 2012-04-16 MED ORDER — ALBUTEROL SULFATE HFA 108 (90 BASE) MCG/ACT IN AERS: 2.0000 | INHALATION_SPRAY | Freq: Four times a day (QID) | RESPIRATORY_TRACT | Status: DC | PRN

## 2012-04-16 MED ORDER — KETOROLAC TROMETHAMINE 30 MG/ML IJ SOLN: 30.0000 mg | Freq: Four times a day (QID) | INTRAMUSCULAR | Status: AC | PRN

## 2012-04-16 MED ORDER — NALOXONE HCL 0.4 MG/ML IJ SOLN: 0.4000 mg | INTRAMUSCULAR | Status: DC | PRN

## 2012-04-16 MED ORDER — TETANUS-DIPHTH-ACELL PERTUSSIS 5-2.5-18.5 LF-MCG/0.5 IM SUSP
0.5000 mL | Freq: Once | INTRAMUSCULAR | Status: AC
  Administered 2012-04-17: 0.5 mL via INTRAMUSCULAR
  Filled 2012-04-16: qty 0.5

## 2012-04-16 MED ORDER — KETOROLAC TROMETHAMINE 60 MG/2ML IM SOLN: 60.0000 mg | Freq: Once | INTRAMUSCULAR | Status: AC | PRN

## 2012-04-16 MED ORDER — DIPHENHYDRAMINE HCL 50 MG/ML IJ SOLN: 25.0000 mg | INTRAMUSCULAR | Status: DC | PRN

## 2012-04-16 MED ORDER — LIDOCAINE-EPINEPHRINE (PF) 2 %-1:200000 IJ SOLN
INTRAMUSCULAR | Status: AC
  Filled 2012-04-16: qty 20

## 2012-04-16 MED ORDER — NALBUPHINE HCL 10 MG/ML IJ SOLN
5.0000 mg | INTRAMUSCULAR | Status: DC | PRN
  Filled 2012-04-16: qty 1

## 2012-04-16 MED ORDER — MEPERIDINE HCL 25 MG/ML IJ SOLN
6.2500 mg | INTRAMUSCULAR | Status: AC | PRN
  Administered 2012-04-16: 6.25 mg via INTRAVENOUS
  Administered 2012-04-16: 12.5 mg via INTRAVENOUS

## 2012-04-16 MED ORDER — SODIUM CHLORIDE 0.9 % IJ SOLN: 3.0000 mL | Freq: Two times a day (BID) | INTRAMUSCULAR | Status: DC

## 2012-04-16 MED ORDER — BISACODYL 10 MG RE SUPP: 10.0000 mg | Freq: Every day | RECTAL | Status: DC | PRN

## 2012-04-16 MED ORDER — PRENATAL MULTIVITAMIN CH
1.0000 | ORAL_TABLET | Freq: Every day | ORAL | Status: DC
  Administered 2012-04-17 – 2012-04-19 (×3): 1 via ORAL
  Filled 2012-04-16 (×3): qty 1

## 2012-04-16 MED ORDER — PHENYLEPHRINE 40 MCG/ML (10ML) SYRINGE FOR IV PUSH (FOR BLOOD PRESSURE SUPPORT)
80.0000 ug | PREFILLED_SYRINGE | INTRAVENOUS | Status: DC | PRN
  Filled 2012-04-16: qty 5

## 2012-04-16 MED ORDER — SODIUM CHLORIDE 0.9 % IV SOLN: 250.0000 mL | INTRAVENOUS | Status: DC

## 2012-04-16 MED ORDER — NALBUPHINE HCL 10 MG/ML IJ SOLN
5.0000 mg | INTRAMUSCULAR | Status: DC | PRN
  Administered 2012-04-17: 10 mg via SUBCUTANEOUS
  Filled 2012-04-16: qty 1

## 2012-04-16 MED ORDER — ONDANSETRON HCL 4 MG/2ML IJ SOLN
INTRAMUSCULAR | Status: AC
  Filled 2012-04-16: qty 2

## 2012-04-16 MED ORDER — ONDANSETRON HCL 4 MG/2ML IJ SOLN: 4.0000 mg | INTRAMUSCULAR | Status: DC | PRN

## 2012-04-16 MED ORDER — FENTANYL 2.5 MCG/ML BUPIVACAINE 1/10 % EPIDURAL INFUSION (WH - ANES)
INTRAMUSCULAR | Status: DC | PRN
  Administered 2012-04-16: 14 mL/h via EPIDURAL

## 2012-04-16 MED ORDER — WITCH HAZEL-GLYCERIN EX PADS: 1.0000 "application " | MEDICATED_PAD | CUTANEOUS | Status: DC | PRN

## 2012-04-16 MED ORDER — CEFAZOLIN SODIUM 1-5 GM-% IV SOLN
INTRAVENOUS | Status: DC | PRN
  Administered 2012-04-16: 1 g via INTRAVENOUS

## 2012-04-16 MED ORDER — MORPHINE SULFATE 0.5 MG/ML IJ SOLN
INTRAMUSCULAR | Status: AC
  Filled 2012-04-16: qty 10

## 2012-04-16 MED ORDER — SODIUM BICARBONATE 8.4 % IV SOLN
INTRAVENOUS | Status: AC
  Filled 2012-04-16: qty 50

## 2012-04-16 MED ORDER — MEPERIDINE HCL 25 MG/ML IJ SOLN: 6.2500 mg | INTRAMUSCULAR | Status: DC | PRN

## 2012-04-16 MED ORDER — FLEET ENEMA 7-19 GM/118ML RE ENEM: 1.0000 | ENEMA | Freq: Every day | RECTAL | Status: DC | PRN

## 2012-04-16 MED ORDER — METOCLOPRAMIDE HCL 5 MG/ML IJ SOLN: 10.0000 mg | Freq: Three times a day (TID) | INTRAMUSCULAR | Status: DC | PRN

## 2012-04-16 MED ORDER — SODIUM CHLORIDE 0.9 % IJ SOLN: 3.0000 mL | INTRAMUSCULAR | Status: DC | PRN

## 2012-04-16 MED ORDER — KETOROLAC TROMETHAMINE 30 MG/ML IJ SOLN
30.0000 mg | Freq: Four times a day (QID) | INTRAMUSCULAR | Status: AC | PRN
  Administered 2012-04-17: 30 mg via INTRAVENOUS
  Filled 2012-04-16: qty 1

## 2012-04-16 MED ORDER — SODIUM CHLORIDE 0.9 % IV SOLN: 1.0000 ug/kg/h | INTRAVENOUS | Status: DC | PRN

## 2012-04-16 MED ORDER — DIPHENHYDRAMINE HCL 50 MG/ML IJ SOLN: 12.5000 mg | INTRAMUSCULAR | Status: DC | PRN

## 2012-04-16 MED ORDER — IBUPROFEN 600 MG PO TABS: 600.0000 mg | ORAL_TABLET | Freq: Four times a day (QID) | ORAL | Status: DC | PRN

## 2012-04-16 MED ORDER — DIBUCAINE 1 % RE OINT: 1.0000 "application " | TOPICAL_OINTMENT | RECTAL | Status: DC | PRN

## 2012-04-16 MED ORDER — ONDANSETRON HCL 4 MG PO TABS
4.0000 mg | ORAL_TABLET | ORAL | Status: DC | PRN
  Administered 2012-04-19: 4 mg via ORAL
  Filled 2012-04-16: qty 1

## 2012-04-16 MED ORDER — OXYCODONE-ACETAMINOPHEN 5-325 MG PO TABS
1.0000 | ORAL_TABLET | Freq: Four times a day (QID) | ORAL | Status: DC | PRN
  Administered 2012-04-17 – 2012-04-18 (×2): 2 via ORAL
  Administered 2012-04-18 – 2012-04-19 (×5): 1 via ORAL
  Filled 2012-04-16: qty 1
  Filled 2012-04-16: qty 2
  Filled 2012-04-16 (×4): qty 1
  Filled 2012-04-16: qty 2

## 2012-04-16 MED ORDER — MEPERIDINE HCL 25 MG/ML IJ SOLN
INTRAMUSCULAR | Status: AC
  Filled 2012-04-16: qty 1

## 2012-04-16 MED ORDER — OXYTOCIN 20 UNITS IN LACTATED RINGERS INFUSION - SIMPLE
125.0000 mL/h | INTRAVENOUS | Status: AC
  Administered 2012-04-16: 125 mL/h via INTRAVENOUS
  Filled 2012-04-16: qty 1000

## 2012-04-16 MED ORDER — KETOROLAC TROMETHAMINE 30 MG/ML IJ SOLN
INTRAMUSCULAR | Status: AC
  Administered 2012-04-16: 30 mg via INTRAVENOUS
  Filled 2012-04-16: qty 1

## 2012-04-16 MED ORDER — LIDOCAINE-EPINEPHRINE (PF) 2 %-1:200000 IJ SOLN
INTRAMUSCULAR | Status: DC | PRN
  Administered 2012-04-16: 5 mL via EPIDURAL

## 2012-04-16 MED ORDER — IBUPROFEN 800 MG PO TABS
800.0000 mg | ORAL_TABLET | Freq: Three times a day (TID) | ORAL | Status: DC | PRN
  Administered 2012-04-17 – 2012-04-19 (×4): 800 mg via ORAL
  Filled 2012-04-16 (×6): qty 1

## 2012-04-16 MED ORDER — SCOPOLAMINE 1 MG/3DAYS TD PT72
MEDICATED_PATCH | TRANSDERMAL | Status: AC
  Administered 2012-04-16: 1.5 mg via TRANSDERMAL
  Filled 2012-04-16: qty 1

## 2012-04-16 MED ORDER — TERBUTALINE SULFATE 1 MG/ML IJ SOLN
0.2500 mg | Freq: Once | INTRAMUSCULAR | Status: AC | PRN
  Administered 2012-04-16: 0.25 mg via SUBCUTANEOUS

## 2012-04-16 MED ORDER — KETOROLAC TROMETHAMINE 30 MG/ML IJ SOLN: 30.0000 mg | Freq: Four times a day (QID) | INTRAMUSCULAR | Status: DC | PRN

## 2012-04-16 MED ORDER — FENTANYL CITRATE 0.05 MG/ML IJ SOLN: 25.0000 ug | INTRAMUSCULAR | Status: DC | PRN

## 2012-04-16 MED ORDER — SODIUM CHLORIDE 0.9 % IV SOLN
1.0000 ug/kg/h | INTRAVENOUS | Status: DC | PRN
  Filled 2012-04-16: qty 2.5

## 2012-04-16 MED ORDER — ZOLPIDEM TARTRATE 5 MG PO TABS: 5.0000 mg | ORAL_TABLET | Freq: Every evening | ORAL | Status: DC | PRN

## 2012-04-16 MED ORDER — CEFAZOLIN SODIUM 1-5 GM-% IV SOLN
1.0000 g | Freq: Once | INTRAVENOUS | Status: AC
  Administered 2012-04-16 (×2): 1 g via INTRAVENOUS
  Filled 2012-04-16: qty 50

## 2012-04-16 MED ORDER — FENTANYL 2.5 MCG/ML BUPIVACAINE 1/10 % EPIDURAL INFUSION (WH - ANES)
14.0000 mL/h | INTRAMUSCULAR | Status: DC
  Administered 2012-04-16: 14 mL/h via EPIDURAL
  Filled 2012-04-16 (×2): qty 60

## 2012-04-16 MED ORDER — LACTATED RINGERS IV SOLN
500.0000 mL | Freq: Once | INTRAVENOUS | Status: AC
  Administered 2012-04-16: 500 mL via INTRAVENOUS

## 2012-04-16 MED ORDER — ONDANSETRON HCL 4 MG/2ML IJ SOLN
INTRAMUSCULAR | Status: DC | PRN
  Administered 2012-04-16: 4 mg via INTRAVENOUS

## 2012-04-16 MED ORDER — DIPHENHYDRAMINE HCL 25 MG PO CAPS
25.0000 mg | ORAL_CAPSULE | ORAL | Status: DC | PRN
  Filled 2012-04-16: qty 1

## 2012-04-16 MED ORDER — ALBUTEROL SULFATE HFA 108 (90 BASE) MCG/ACT IN AERS
2.0000 | INHALATION_SPRAY | Freq: Four times a day (QID) | RESPIRATORY_TRACT | Status: DC | PRN
  Filled 2012-04-16: qty 6.7

## 2012-04-16 MED ORDER — EPHEDRINE 5 MG/ML INJ
10.0000 mg | INTRAVENOUS | Status: DC | PRN
  Filled 2012-04-16: qty 4

## 2012-04-16 MED ORDER — LACTATED RINGERS IV SOLN
INTRAVENOUS | Status: DC | PRN
  Administered 2012-04-16 (×2): via INTRAVENOUS

## 2012-04-16 MED ORDER — MEASLES, MUMPS & RUBELLA VAC ~~LOC~~ INJ
0.5000 mL | INJECTION | Freq: Once | SUBCUTANEOUS | Status: DC
  Filled 2012-04-16: qty 0.5

## 2012-04-16 MED ORDER — LIDOCAINE HCL (PF) 1 % IJ SOLN
INTRAMUSCULAR | Status: DC | PRN
  Administered 2012-04-16 (×2): 4 mL

## 2012-04-16 MED ORDER — ONDANSETRON HCL 4 MG/2ML IJ SOLN: 4.0000 mg | Freq: Three times a day (TID) | INTRAMUSCULAR | Status: DC | PRN

## 2012-04-16 MED ORDER — SIMETHICONE 80 MG PO CHEW: 80.0000 mg | CHEWABLE_TABLET | ORAL | Status: DC | PRN

## 2012-04-16 MED ORDER — PHENYLEPHRINE 40 MCG/ML (10ML) SYRINGE FOR IV PUSH (FOR BLOOD PRESSURE SUPPORT): 80.0000 ug | PREFILLED_SYRINGE | INTRAVENOUS | Status: DC | PRN

## 2012-04-16 MED ORDER — SCOPOLAMINE 1 MG/3DAYS TD PT72: 1.0000 | MEDICATED_PATCH | Freq: Once | TRANSDERMAL | Status: DC

## 2012-04-16 MED ORDER — KETOROLAC TROMETHAMINE 30 MG/ML IJ SOLN
30.0000 mg | Freq: Four times a day (QID) | INTRAMUSCULAR | Status: DC | PRN
  Administered 2012-04-16: 30 mg via INTRAVENOUS

## 2012-04-16 MED ORDER — EPHEDRINE 5 MG/ML INJ: 10.0000 mg | INTRAVENOUS | Status: DC | PRN

## 2012-04-16 MED ORDER — SENNOSIDES-DOCUSATE SODIUM 8.6-50 MG PO TABS
2.0000 | ORAL_TABLET | Freq: Every day | ORAL | Status: DC
  Administered 2012-04-16 – 2012-04-18 (×3): 2 via ORAL

## 2012-04-16 MED ORDER — LANOLIN HYDROUS EX OINT: 1.0000 "application " | TOPICAL_OINTMENT | CUTANEOUS | Status: DC | PRN

## 2012-04-16 MED ORDER — MORPHINE SULFATE (PF) 0.5 MG/ML IJ SOLN
INTRAMUSCULAR | Status: DC | PRN
  Administered 2012-04-16: 3 mg via EPIDURAL

## 2012-04-16 MED ORDER — SIMETHICONE 80 MG PO CHEW
80.0000 mg | CHEWABLE_TABLET | Freq: Three times a day (TID) | ORAL | Status: DC
  Administered 2012-04-16 – 2012-04-19 (×8): 80 mg via ORAL

## 2012-04-16 MED ORDER — DIPHENHYDRAMINE HCL 25 MG PO CAPS
25.0000 mg | ORAL_CAPSULE | Freq: Four times a day (QID) | ORAL | Status: DC | PRN
  Filled 2012-04-16: qty 1

## 2012-04-16 MED ORDER — SCOPOLAMINE 1 MG/3DAYS TD PT72
1.0000 | MEDICATED_PATCH | Freq: Once | TRANSDERMAL | Status: DC
  Administered 2012-04-16: 1.5 mg via TRANSDERMAL

## 2012-04-16 SURGICAL SUPPLY — 25 items
CLOTH BEACON ORANGE TIMEOUT ST (SAFETY) ×2 IMPLANT
DRESSING TELFA 8X3 (GAUZE/BANDAGES/DRESSINGS) IMPLANT
DRSG COVADERM 4X10 (GAUZE/BANDAGES/DRESSINGS) ×2 IMPLANT
ELECT REM PT RETURN 9FT ADLT (ELECTROSURGICAL) ×2
ELECTRODE REM PT RTRN 9FT ADLT (ELECTROSURGICAL) ×1 IMPLANT
EXTRACTOR VACUUM M CUP 4 TUBE (SUCTIONS) IMPLANT
GAUZE SPONGE 4X4 12PLY STRL LF (GAUZE/BANDAGES/DRESSINGS) ×4 IMPLANT
GLOVE BIO SURGEON STRL SZ7 (GLOVE) ×4 IMPLANT
GOWN PREVENTION PLUS LG XLONG (DISPOSABLE) ×6 IMPLANT
KIT ABG SYR 3ML LUER SLIP (SYRINGE) ×1 IMPLANT
NDL HYPO 25X5/8 SAFETYGLIDE (NEEDLE) ×1 IMPLANT
NEEDLE HYPO 25X5/8 SAFETYGLIDE (NEEDLE) ×2 IMPLANT
NS IRRIG 1000ML POUR BTL (IV SOLUTION) ×2 IMPLANT
PACK C SECTION WH (CUSTOM PROCEDURE TRAY) ×2 IMPLANT
PAD ABD 7.5X8 STRL (GAUZE/BANDAGES/DRESSINGS) IMPLANT
SLEEVE SCD COMPRESS KNEE MED (MISCELLANEOUS) IMPLANT
STRIP CLOSURE SKIN 1/2X4 (GAUZE/BANDAGES/DRESSINGS) ×2 IMPLANT
SUT CHROMIC 0 CTX 36 (SUTURE) ×8 IMPLANT
SUT MON AB 4-0 PS1 27 (SUTURE) ×2 IMPLANT
SUT PDS AB 0 CT1 27 (SUTURE) ×4 IMPLANT
SUT VIC AB 3-0 CT1 27 (SUTURE) ×4
SUT VIC AB 3-0 CT1 TAPERPNT 27 (SUTURE) ×2 IMPLANT
TOWEL OR 17X24 6PK STRL BLUE (TOWEL DISPOSABLE) ×4 IMPLANT
TRAY FOLEY CATH 14FR (SET/KITS/TRAYS/PACK) ×2 IMPLANT
WATER STERILE IRR 1000ML POUR (IV SOLUTION) ×1 IMPLANT

## 2012-04-16 NOTE — H&P (Signed)
Bailey Bailey is a 21 y.o. female presenting for induction @ 41 weeks. Maternal Medical History:  Fetal activity: Perceived fetal activity is normal.      OB History    Grav Para Term Preterm Abortions TAB SAB Ect Mult Living   1              Past Medical History  Diagnosis Date  . Asthma   . Anemia   . Anxiety     panic attacks   Past Surgical History  Procedure Date  . No past surgeries    Family History: family history includes Diabetes in her father; Heart disease (age of onset:62) in her maternal grandmother; Kidney disease in her mother; and Migraines in her mother. Social History:  reports that she has never smoked. She has never used smokeless tobacco. She reports that she does not drink alcohol or use illicit drugs.  ROS  Dilation: 1 Effacement (%): 70 Station: -2 Exam by:: Laurene Footman, RN/Armecia White, RN Blood pressure 121/71, pulse 94, temperature 97.2 F (36.2 C), temperature source Axillary, resp. rate 20, height 5\' 6"  (1.676 m), weight 190 lb (86.183 kg), last menstrual period 06/04/2011. Maternal Exam:  Uterine Assessment: Contraction strength is mild.  Contraction frequency is irregular.   Abdomen: Patient reports no abdominal tenderness. Fundal height is term.   Estimated fetal weight is AGA.   Fetal presentation: vertex  Introitus: Normal vulva. Normal vagina.  Pelvis: adequate for delivery.   Cervix: Cervix evaluated by digital exam.     Physical Exam  Constitutional: She is oriented to person, place, and time. She appears well-developed and well-nourished.  HENT:  Head: Normocephalic and atraumatic.  Neck: Normal range of motion. Neck supple.  Cardiovascular: Normal rate and regular rhythm.   Respiratory: Effort normal and breath sounds normal.  GI:       Term FH, FHR 148  Genitourinary:       ISE for AROM>>2/100%/-2  Musculoskeletal: Normal range of motion.  Neurological: She is alert and oriented to person, place, and time.      Prenatal labs: ABO, Rh: A/Positive/-- (09/05 0000) Antibody: Negative (09/05 0000) Rubella: Immune (09/05 0000) RPR: NON REACTIVE (04/23 2020)  HBsAg: Negative (09/05 0000)  HIV: Non-reactive (09/05 0000)  GBS: Negative (03/25 0000)   Assessment/Plan: 41 week IUP>>2 stage labor induction, now for pit aug + epidural   Sathvik Tiedt M 04/16/2012, 7:59 AM

## 2012-04-16 NOTE — Progress Notes (Signed)
FOB offered a stationary chair for epidural placement. Chair denied

## 2012-04-16 NOTE — Addendum Note (Signed)
Addendum  created 04/16/12 1646 by Sandrea Hughs., MD   Modules edited:Orders, PRL Based Order Sets

## 2012-04-16 NOTE — Anesthesia Postprocedure Evaluation (Signed)
Anesthesia Post Note  Patient: Bailey Bailey  Procedure(s) Performed: Procedure(s) (LRB): CESAREAN SECTION (N/A)  Anesthesia type: Epidural  Patient location: PACU  Post pain: Pain level controlled  Post assessment: Post-op Vital signs reviewed  Last Vitals:  Filed Vitals:   04/16/12 1430  BP: 109/55  Pulse: 99  Temp: 37.9 C  Resp: 24    Post vital signs: Reviewed  Level of consciousness: awake  Complications: No apparent anesthesia complications

## 2012-04-16 NOTE — Transfer of Care (Signed)
Immediate Anesthesia Transfer of Care Note  Patient: Bailey Bailey  Procedure(s) Performed: Procedure(s) (LRB): CESAREAN SECTION (N/A)  Patient Location: PACU  Anesthesia Type: Epidural  Level of Consciousness: awake  Airway & Oxygen Therapy: Patient Spontanous Breathing  Post-op Assessment: Report given to PACU RN and Post -op Vital signs reviewed and stable  Post vital signs: stable  Complications: No apparent anesthesia complications

## 2012-04-16 NOTE — OR Nursing (Signed)
7.30 CORD PH

## 2012-04-16 NOTE — Progress Notes (Addendum)
Bailey Overlie, MD, notified of repetitive late decelerations, interventions done, and decreased variability. Orders received to give 0.25 mg of terbutaline.

## 2012-04-16 NOTE — Anesthesia Preprocedure Evaluation (Addendum)
Anesthesia Evaluation  Patient identified by MRN, date of birth, ID band Patient awake    Reviewed: Allergy & Precautions, H&P , Patient's Chart, lab work & pertinent test results  Airway Mallampati: III TM Distance: >3 FB Neck ROM: full    Dental No notable dental hx. (+) Teeth Intact   Pulmonary asthma ,  breath sounds clear to auscultation  Pulmonary exam normal       Cardiovascular negative cardio ROS  Rhythm:regular Rate:Normal     Neuro/Psych Anxiety negative neurological ROS     GI/Hepatic negative GI ROS, Neg liver ROS,   Endo/Other  negative endocrine ROS  Renal/GU negative Renal ROS  negative genitourinary   Musculoskeletal   Abdominal   Peds  Hematology  (+) Blood dyscrasia, anemia ,   Anesthesia Other Findings   Reproductive/Obstetrics (+) Pregnancy                           Anesthesia Physical Anesthesia Plan  ASA: II and Emergent  Anesthesia Plan: Epidural   Post-op Pain Management:    Induction:   Airway Management Planned:   Additional Equipment:   Intra-op Plan:   Post-operative Plan:   Informed Consent: I have reviewed the patients History and Physical, chart, labs and discussed the procedure including the risks, benefits and alternatives for the proposed anesthesia with the patient or authorized representative who has indicated his/her understanding and acceptance.     Plan Discussed with: Anesthesiologist  Anesthesia Plan Comments:        Anesthesia Quick Evaluation

## 2012-04-16 NOTE — Progress Notes (Signed)
Patient ID: Bailey Bailey, female   DOB: [birth year], 20 y.o.   MRN: [id number] CTSP re late decels after pitocin>>advised DC pit + O2, now 3/C/-1 w/ mod mec, w/o pit ctx q 3-4 with resolution of late pattern>>discussed CS for fetal intol to labor if recurrence of late pattern

## 2012-04-16 NOTE — OR Nursing (Signed)
Fetal heart rate 145

## 2012-04-16 NOTE — Progress Notes (Signed)
Bailey Overlie, MD, at bedside. Provider reviewed strip. MD informs patient of the risks and benefits of a cesarean section. Patient verbalizes understanding. Consents obtained.

## 2012-04-16 NOTE — Anesthesia Procedure Notes (Signed)
Epidural Patient location during procedure: OB Start time: 04/16/2012 8:34 AM  Staffing Anesthesiologist: Kastin Cerda A. Performed by: anesthesiologist   Preanesthetic Checklist Completed: patient identified, site marked, surgical consent, pre-op evaluation, timeout performed, IV checked, risks and benefits discussed and monitors and equipment checked  Epidural Patient position: sitting Prep: site prepped and draped and DuraPrep Patient monitoring: continuous pulse ox and blood pressure Approach: midline Injection technique: LOR air  Needle:  Needle type: Tuohy  Needle gauge: 17 G Needle length: 9 cm Needle insertion depth: 6 cm Catheter type: closed end flexible Catheter size: 19 Gauge Catheter at skin depth: 11 cm Test dose: negative and Other  Assessment Events: blood not aspirated, injection not painful, no injection resistance, negative IV test and no paresthesia  Additional Notes Patient identified. Risks and benefits discussed including failed block, incomplete  Pain control, post dural puncture headache, nerve damage, paralysis, blood pressure Changes, nausea, vomiting, reactions to medications-both toxic and allergic and post Partum back pain. All questions were answered. Patient expressed understanding and wished to proceed. Sterile technique was used throughout procedure. Epidural site was Dressed with sterile barrier dressing. No paresthesias, signs of intravascular injection Or signs of intrathecal spread were encountered.  Patient was more comfortable after the epidural was dosed. Please see RN's note for documentation of vital signs and FHR which are stable.

## 2012-04-16 NOTE — Op Note (Signed)
Preoperative diagnosis: Fetal intolerance to labor  Postoperative diagnosis: Same plus moderate meconium, was nuchal cord x1  Procedure: Primary low transverse cesarean section  Surgeon: Marcelle Overlie EBL: 800 cc  Specimens removed: Placenta to pathology  Procedure and findings:  The patient was taken to the operating room prepped and draped in usual fashion for cesarean section after spinal anesthetic. A Foley catheter positioned an inferior. Appropriate time out was taken. She did receive Ancef 1 g IV preoperatively and had stable FHR just prior to starting the case.  Transverse and was also some thickening of possible sepsis carried down the fascia was incised and extended transversely. Rectus muscle divided in the midline peritoneum entered superiorly without incident and extended in a vertical fashion. The vesicouterine serosal incised and the bladder was bluntly and sharply dissected below. Transverse incision in the lower segment extended with bandage scissors moderate meconium was noted at that point was nuchal cord x1 easily delivery of the vertex suction prior to delivery of the torso the infant was taken to the care the NICU physicians after cord clamp. PH was sent placenta was delivered spots manually intact sent to pathology uterus exteriorized cavity wiped clean with laparotomy lap pack closure the first layer of 0 chromic in a locked fashion followed by an imbricating layer of 0 chromic. This is hemostatic tubes and ovaries were inspected and noted to be normal the bladder flap area was intact and hemostaticprior to closure, sponge, needle, instrument countscorrect x2 peritoneum closed with a 2-0 Vicryl suture. 2-0 Vicryl interrupted sutures used to close the rectus muscles the midline 0 PDS was then used to close the fascia from laterally to bilaterally this the subcutaneous tissue was irrigated and be hemostatic 4 Monocryl subcuticular suture. Mother and baby doing well at that point clear  urine noted in the case.  Dictated Dragon medical Vasti Yagi M. Milana Obey.D.

## 2012-04-16 NOTE — Progress Notes (Signed)
Patient ID: Bailey Bailey, female   DOB: Apr 13, 1991, 20 y.o.   MRN: [id number] Ctx q 3-4 with return of late decels>>on O2, SQ terb X 1>>plan>>LTCS, procdure + risks discussed, epidural recently started

## 2012-04-17 LAB — CBC
HCT: 26.3 % — ABNORMAL LOW (ref 36.0–46.0)
Hemoglobin: 8.6 g/dL — ABNORMAL LOW (ref 12.0–15.0)

## 2012-04-17 MED ORDER — LORATADINE 10 MG PO TABS
10.0000 mg | ORAL_TABLET | Freq: Every day | ORAL | Status: DC
  Administered 2012-04-17 – 2012-04-19 (×3): 10 mg via ORAL
  Filled 2012-04-17 (×3): qty 1

## 2012-04-17 NOTE — Progress Notes (Signed)
POD #1 Good pain relief, abulating, no void yet, no flatus yet, tolerating regular diet Seasonal allergies-takes Zyrtec  Blood pressure 106/69, pulse 88, temperature 98.9 F (37.2 C), temperature source Oral, resp. rate 20, height 5\' 6"  (1.676 m), weight 86.183 kg (190 lb), last menstrual period 06/04/2011, SpO2 98.00%, unknown if currently breastfeeding.  Lungs CTA Cor RRR Abd soft, BS + Ext NT  Results for orders placed during the hospital encounter of 04/15/12 (from the past 24 hour(s))  CBC     Status: Abnormal   Collection Time   04/17/12  5:45 AM      Component Value Range   WBC 19.4 (*) 4.0 - 10.5 (K/uL)   RBC 2.87 (*) 3.87 - 5.11 (MIL/uL)   Hemoglobin 8.6 (*) 12.0 - 15.0 (g/dL)   HCT 16.1 (*) 09.6 - 46.0 (%)   MCV 91.6  78.0 - 100.0 (fL)   MCH 30.0  26.0 - 34.0 (pg)   MCHC 32.7  30.0 - 36.0 (g/dL)   RDW 04.5  40.9 - 81.1 (%)   Platelets 180  150 - 400 (K/uL)  [facility] MATERNAL DONOR DRAW     Status: Normal   Collection Time   04/17/12  5:55 AM      Component Value Range   [facility] Maternal Donor Draw COLLECTED BY LABORATORY      A: Satisfactory  P: Per Orders     Claritin ordered

## 2012-04-18 NOTE — Progress Notes (Signed)
Subjective: Postpartum Day 2 Cesarean Delivery Patient reports tolerating PO, + flatus and no problems voiding.    Objective: Vital signs in last 24 hours: Temp:  [97.4 F (36.3 C)-99 F (37.2 C)] 97.4 F (36.3 C) (04/26 0602) Pulse Rate:  [82-101] 82  (04/26 0602) Resp:  [18-20] 18  (04/26 0602) BP: (105-116)/(69-76) 105/71 mmHg (04/26 0602) SpO2:  [100 %] 100 % (04/25 1145)  Physical Exam:  General: alert, cooperative and appears stated age Lochia: appropriate Uterine Fundus: firm Incision: healing well DVT Evaluation: No evidence of DVT seen on physical exam.   Basename 04/17/12 0545 04/15/12 2020  HGB 8.6* 12.5  HCT 26.3* 38.9    Assessment/Plan: Status post Cesarean section. Doing well postoperatively.  Discharge home tomorrow.  Savannah Erbe L 04/18/2012, 8:56 AM

## 2012-04-19 MED ORDER — IBUPROFEN 800 MG PO TABS: 800.0000 mg | ORAL_TABLET | Freq: Three times a day (TID) | ORAL | Status: AC | PRN

## 2012-04-19 MED ORDER — OXYCODONE-ACETAMINOPHEN 5-325 MG PO TABS: 1.0000 | ORAL_TABLET | Freq: Four times a day (QID) | ORAL | Status: AC | PRN

## 2012-04-19 NOTE — Discharge Summary (Signed)
Obstetric Discharge Summary Reason for Admission: induction of labor Prenatal Procedures: none Intrapartum Procedures: cesarean: low cervical, transverse Postpartum Procedures: none Complications-Operative and Postpartum: none Hemoglobin  Date Value Range Status  04/17/2012 8.6* 12.0-15.0 (g/dL) Final     DELTA CHECK NOTED     REPEATED TO VERIFY     HCT  Date Value Range Status  04/17/2012 26.3* 36.0-46.0 (%) Final    Physical Exam:  General: alert, cooperative and appears stated age 11: appropriate Uterine Fundus: firm Incision: healing well, no significant drainage, no dehiscence, no significant erythema DVT Evaluation: No evidence of DVT seen on physical exam.  Discharge Diagnoses: Term Pregnancy-delivered  Discharge Information: Date: 04/19/2012 Activity: pelvic rest Diet: routine Medications: Ibuprofen and Percocet Condition: improved Instructions: refer to practice specific booklet Discharge to: home   Newborn Data: Live born female  Birth Weight: 7 lb 10.2 oz (3465 g) APGAR: 9, 9  Home with mother.  Burma Ketcher L 04/19/2012, 7:37 AM

## 2012-04-19 NOTE — Progress Notes (Signed)
Subjective: Postpartum Day 3: Cesarean Delivery Patient reports incisional pain, tolerating PO, + flatus and no problems voiding.    Objective: Vital signs in last 24 hours: Temp:  [97.8 F (36.6 C)-98 F (36.7 C)] 97.9 F (36.6 C) (04/27 0553) Pulse Rate:  [73-93] 73  (04/27 0553) Resp:  [18-20] 18  (04/27 0553) BP: (105-126)/(69-82) 107/74 mmHg (04/27 0553)  Physical Exam:  General: alert, cooperative and appears stated age Lochia: appropriate Uterine Fundus: firm Incision: healing well, no significant drainage, no dehiscence, no significant erythema DVT Evaluation: No evidence of DVT seen on physical exam.   Basename 04/17/12 0545  HGB 8.6*  HCT 26.3*    Assessment/Plan: Status post Cesarean section. Doing well postoperatively.  Discharge home with standard precautions and return to clinic in 1 week.  Cobin Cadavid L 04/19/2012, 7:37 AM

## 2012-04-21 ENCOUNTER — Encounter (HOSPITAL_COMMUNITY): Payer: Self-pay | Admitting: Obstetrics and Gynecology

## 2012-05-27 ENCOUNTER — Ambulatory Visit (HOSPITAL_COMMUNITY)
Admission: RE | Admit: 2012-05-27 | Discharge: 2012-05-27 | Disposition: A | Discharge status: Home / Self Care | Payer: 59 | Source: Ambulatory Visit | Attending: Obstetrics and Gynecology | Admitting: Obstetrics and Gynecology

## 2012-05-27 NOTE — Progress Notes (Signed)
Adult Lactation Consultation Outpatient Visit Note  Patient Name: Bailey Bailey    BABY: PEYTON DIXON Date of Birth: Mar 26, 1991                      DOB: [birth year] Gestational Age at Delivery: term       BIRTH WEIGHT: 7-10 Type of Delivery: C/S                          WEIGHT TODAY: 9-14  Breastfeeding History: Frequency of Breastfeeding: NONE IN PAST 2 WEEKS Length of Feeding:  Voids: QS Stools: QS  Supplementing / Method:EBM/FORMULA every 3 hours 4 oz per bottle Pumping:  Type of Pump:manual/has wic lactina but doesn't think it works as well   Frequency:3-4 times/day  Volume:  3 oz. Total/day  Comments:    Consultation Evaluation: Mom here with 46 week old baby wanting assist getting baby back to breast.  Mom states baby started refusing breast 2 weeks ago when baby was diagnosed with thrush.  Baby's mouth clear but buttocks and labia with red pinpoint raised rash.  Instructed mom to call pedi to report rash.  Mom never experienced any breast symptoms of yeast.  Mom teary at times expressing lack of support from FOB.  Encouraged mom to call OB to schedule post partum exam and discuss feelings with MD.  Mom's breasts are soft but milk visible with hand expression.  Assisted with positioning and latching baby in cross cradle hold.  Baby unable to sustain latch after several attempts.  24 mm nipple shield used and baby nursed with on both breasts for 15 minutes.  Few audible swallows  Baby transferred 14 mls total.  Plan 1) breastfeed baby first with feeding cues using nipple shield if needed 2) pump both breasts x 15 minutes using DEBP after daytime feedings 3) supplement with 3-4 oz of expressed breast milk/formula  Initial Feeding Assessment:15 minutes on each breast Pre-feed [id number] Post-feed [id number] Amount Transferred:14 mls Comments:  Additional Feeding Assessment: Pre-feed Weight: Post-feed Weight: Amount Transferred: Comments:  Additional Feeding  Assessment: Pre-feed Weight: Post-feed Weight: Amount Transferred: Comments:  Total Breast milk Transferred this Visit:  Total Supplement Given:   Additional Interventions:   Follow-Up will call prn      Hansel Feinstein 05/27/2012, 5:41 PM

## 2013-07-24 DIAGNOSIS — Z8619 Personal history of other infectious and parasitic diseases: Secondary | ICD-10-CM

## 2013-07-24 HISTORY — DX: Personal history of other infectious and parasitic diseases: Z86.19

## 2013-12-24 DIAGNOSIS — K08409 Partial loss of teeth, unspecified cause, unspecified class: Secondary | ICD-10-CM

## 2013-12-24 HISTORY — DX: Partial loss of teeth, unspecified cause, unspecified class: K08.409

## 2014-09-15 ENCOUNTER — Other Ambulatory Visit (HOSPITAL_COMMUNITY)
Admission: RE | Admit: 2014-09-15 | Discharge: 2014-09-15 | Disposition: A | Discharge status: Home / Self Care | Payer: 59 | Source: Ambulatory Visit | Attending: Family Medicine | Admitting: Family Medicine

## 2014-09-15 ENCOUNTER — Emergency Department (INDEPENDENT_AMBULATORY_CARE_PROVIDER_SITE_OTHER)
Admission: EM | Admit: 2014-09-15 | Discharge: 2014-09-15 | Disposition: A | Discharge status: Home / Self Care | Payer: 59 | Source: Home / Self Care | Attending: Family Medicine | Admitting: Family Medicine

## 2014-09-15 ENCOUNTER — Encounter (HOSPITAL_COMMUNITY): Payer: Self-pay | Admitting: Emergency Medicine

## 2014-09-15 DIAGNOSIS — Z113 Encounter for screening for infections with a predominantly sexual mode of transmission: Secondary | ICD-10-CM | POA: Insufficient documentation

## 2014-09-15 DIAGNOSIS — L662 Folliculitis decalvans: Secondary | ICD-10-CM

## 2014-09-15 DIAGNOSIS — L658 Other specified nonscarring hair loss: Secondary | ICD-10-CM

## 2014-09-15 DIAGNOSIS — N760 Acute vaginitis: Secondary | ICD-10-CM | POA: Diagnosis present

## 2014-09-15 NOTE — Discharge Instructions (Signed)
We will call with positive test results and treat as indicated, otherwise care as discussed.

## 2014-09-15 NOTE — ED Notes (Signed)
Patient complains of having some vaginal irritation States having some discharge and red"bumps" in her vagina Patient states last time that she had relations the condom came off inside her

## 2014-09-15 NOTE — ED Provider Notes (Signed)
CSN: [id number]     Arrival date & time 09/15/14  1808 History   First MD Initiated Contact with Patient 09/15/14 1832     Chief Complaint  Patient presents with  . vaginal irritation    (Consider location/radiation/quality/duration/timing/severity/associated sxs/prior Treatment) Patient is a 23 y.o. female presenting with rash. The history is provided by the patient.  Rash Location:  Ano-genital Ano-genital rash location:  Vulva Quality: itchiness and redness   Severity:  Mild Onset quality:  Gradual Duration:  1 month Progression:  Unchanged Chronicity:  New Context comment:  Shaving to remove pubic hair completely, bumps have appeared. Relieved by:  None tried Worsened by:  Nothing tried Ineffective treatments:  None tried   Past Medical History  Diagnosis Date  . Asthma   . Anemia   . Anxiety     panic attacks   Past Surgical History  Procedure Laterality Date  . No past surgeries    . Cesarean section  04/16/2012    Procedure: CESAREAN SECTION;  Surgeon: Meriel Pica, MD;  Location: [facility];  Service: Gynecology;  Laterality: N/A;   Family History  Problem Relation Age of Onset  . Kidney disease Mother   . Migraines Mother   . Diabetes Father   . Heart disease Maternal Grandmother 41    CHF   History  Substance Use Topics  . Smoking status: Never Smoker   . Smokeless tobacco: Never Used  . Alcohol Use: No   OB History   Grav Para Term Preterm Abortions TAB SAB Ect Mult Living   0 0 0 0 0 0 1     Review of Systems  Constitutional: Negative.   Gastrointestinal: Negative.   Genitourinary: Positive for genital sores. Negative for vaginal bleeding, vaginal discharge, menstrual problem and pelvic pain.  Skin: Positive for rash.    Allergies  Review of patient's allergies indicates no known allergies.  Home Medications   Prior to Admission medications   Medication Sig Start Date End Date Taking? Authorizing Provider  albuterol (PROVENTIL  HFA;VENTOLIN HFA) 108 (90 BASE) MCG/ACT inhaler Inhale 2 puffs into the lungs every 6 (six) hours as needed. For shortness of breath/asthma    Historical Provider, MD  cetirizine (ZYRTEC) 10 MG tablet Take 10 mg by mouth daily as needed. For allergies    Historical Provider, MD  Prenatal Vit-Fe Fumarate-FA (PRENATAL MULTIVITAMIN) TABS Take 1 tablet by mouth at bedtime.     Historical Provider, MD   BP 111/77  Pulse 83  Temp(Src) 97.4 F (36.3 C) (Oral)  SpO2 100% Physical Exam  Nursing note and vitals reviewed. Constitutional: She is oriented to person, place, and time. She appears well-developed and well-nourished.  Abdominal: Soft. Bowel sounds are normal.  Genitourinary:    No vaginal discharge found.  Neurological: She is alert and oriented to person, place, and time.  Skin: Skin is warm and dry.    ED Course  Procedures (including critical care time) Labs Review Labs Reviewed  CERVICOVAGINAL ANCILLARY ONLY    Imaging Review No results found.   MDM   1. Folliculitis depilans        Linna Hoff, MD 09/15/14 Windell Moment

## 2014-09-16 LAB — CERVICOVAGINAL ANCILLARY ONLY
CHLAMYDIA, DNA PROBE: POSITIVE — AB
NEISSERIA GONORRHEA: NEGATIVE
WET PREP (BD AFFIRM): NEGATIVE
WET PREP (BD AFFIRM): POSITIVE — AB
Wet Prep (BD Affirm): NEGATIVE

## 2014-09-17 NOTE — ED Notes (Signed)
Final report of STD screenings available for review. Patient negative for GC, trichomonas, gardnerella. positive for yeast and chlamydia. No treatment at time of visit. Discussed a Dr Griffin Basil, who authorized treatment w Diflucan 150 mg, PO x 1 w 1 fefill if needed. And Azithromycin 1 gram x 1 dose. Spoke to patient, and after verifying ID, discussed positive findings . She is to take medication as prescribed, and is to advise partner of positive lab reports so they may also seek treatment. They are to refrain from sex until 7 days after both are treated to avoid new infections. Rx called in to [facility] at patient request, spoke directly w pharmacist. Form 2124 completed and faxed to [facility]

## 2014-10-25 ENCOUNTER — Encounter (HOSPITAL_COMMUNITY): Payer: Self-pay | Admitting: Emergency Medicine

## 2014-12-07 ENCOUNTER — Emergency Department (INDEPENDENT_AMBULATORY_CARE_PROVIDER_SITE_OTHER)
Admission: EM | Admit: 2014-12-07 | Discharge: 2014-12-07 | Disposition: A | Discharge status: Home / Self Care | Payer: 59 | Source: Home / Self Care | Attending: Family Medicine | Admitting: Family Medicine

## 2014-12-07 ENCOUNTER — Encounter (HOSPITAL_COMMUNITY): Payer: Self-pay | Admitting: *Deleted

## 2014-12-07 DIAGNOSIS — Z3201 Encounter for pregnancy test, result positive: Secondary | ICD-10-CM

## 2014-12-07 DIAGNOSIS — Z331 Pregnant state, incidental: Secondary | ICD-10-CM

## 2014-12-07 DIAGNOSIS — N939 Abnormal uterine and vaginal bleeding, unspecified: Secondary | ICD-10-CM

## 2014-12-07 LAB — POCT URINALYSIS DIP (DEVICE)
Glucose, UA: 100 mg/dL — AB
Hgb urine dipstick: NEGATIVE
Ketones, ur: 40 mg/dL — AB
NITRITE: POSITIVE — AB
PH: 5.5 (ref 5.0–8.0)
PROTEIN: NEGATIVE mg/dL
Specific Gravity, Urine: >=1.03 (ref 1.005–1.030)
UROBILINOGEN UA: 1 mg/dL (ref 0.0–1.0)

## 2014-12-07 LAB — POCT PREGNANCY, URINE: Preg Test, Ur: POSITIVE — AB

## 2014-12-07 NOTE — ED Provider Notes (Signed)
CSN: [id number]     Arrival date & time 12/07/14  [id number] History   First MD Initiated Contact with Patient 12/07/14 0915     Chief Complaint  Patient presents with  . Possible Pregnancy   (Consider location/radiation/quality/duration/timing/severity/associated sxs/prior Treatment) Patient is a 23 y.o. female presenting with female genitourinary complaint. The history is provided by the patient.  Female GU Problem This is a new problem. Episode onset: lmp last mo, no birth control. Pertinent negatives include no chest pain. Associated symptoms comments: Cramping, no bleeding..    Past Medical History  Diagnosis Date  . Asthma   . Anemia   . Anxiety     panic attacks   Past Surgical History  Procedure Laterality Date  . No past surgeries    . Cesarean section  04/16/2012    Procedure: CESAREAN SECTION;  Surgeon: Meriel Picaichard M Holland, MD;  Location: [facility];  Service: Gynecology;  Laterality: N/A;   Family History  Problem Relation Age of Onset  . Kidney disease Mother   . Migraines Mother   . Diabetes Father   . Heart disease Maternal Grandmother [phone]    CHF   History  Substance Use Topics  . Smoking status: Never Smoker   . Smokeless tobacco: Never Used  . Alcohol Use: No   OB History    Gravida Para Term Preterm AB TAB SAB Ectopic Multiple Living   1 1 1  0 0 0 0 0 0 1     Review of Systems  Cardiovascular: Negative for chest pain.  Gastrointestinal: Negative.   Genitourinary: Positive for menstrual problem. Negative for urgency, frequency, flank pain and pelvic pain.    Allergies  Review of patient's allergies indicates no known allergies.  Home Medications   Prior to Admission medications   Medication Sig Start Date End Date Taking? Authorizing Provider  albuterol (PROVENTIL HFA;VENTOLIN HFA) 108 (90 BASE) MCG/ACT inhaler Inhale 2 puffs into the lungs every 6 (six) hours as needed. For shortness of breath/asthma    Historical Provider, MD  cetirizine (ZYRTEC) 10 MG  tablet Take 10 mg by mouth daily as needed. For allergies    Historical Provider, MD  Prenatal Vit-Fe Fumarate-FA (PRENATAL MULTIVITAMIN) TABS Take 1 tablet by mouth at bedtime.     Historical Provider, MD   BP 113/74 mmHg  Pulse 79  Temp(Src) 98.7 F (37.1 C) (Oral)  Resp 16  SpO2 97% Physical Exam  Constitutional: She is oriented to person, place, and time. She appears well-developed and well-nourished. No distress.  Abdominal: There is no tenderness.  Neurological: She is alert and oriented to person, place, and time.  Skin: Skin is warm and dry.  Nursing note and vitals reviewed.   ED Course  Procedures (including critical care time) Labs Review Labs Reviewed  POCT URINALYSIS DIP (DEVICE) - Abnormal; Notable for the following:    Glucose, UA 100 (*)    Bilirubin Urine SMALL (*)    Ketones, ur 40 (*)    Nitrite POSITIVE (*)    Leukocytes, UA TRACE (*)    All other components within normal limits  POCT PREGNANCY, URINE - Abnormal; Notable for the following:    Preg Test, Ur POSITIVE (*)    All other components within normal limits    Imaging Review No results found.   MDM   1. Pregnancy as incidental finding        Linna HoffJames D Mialynn Shelvin, MD 12/07/14 [phone]

## 2014-12-07 NOTE — ED Notes (Signed)
Pt  Thinks  She  Is  Pregnant      -      She       Is late  On  Her  Period  -  She      Reports  Some  Low  abd  Cramping           She    denys  Any  Bleeding

## 2014-12-07 NOTE — Discharge Instructions (Signed)
Call your doctor for prenatal care.

## 2014-12-15 ENCOUNTER — Encounter (HOSPITAL_COMMUNITY): Payer: Self-pay

## 2014-12-15 ENCOUNTER — Inpatient Hospital Stay (HOSPITAL_COMMUNITY)
Admission: AD | Admit: 2014-12-15 | Discharge: 2014-12-15 | Disposition: A | Discharge status: Home / Self Care | Payer: 59 | Source: Ambulatory Visit | Attending: Family Medicine | Admitting: Family Medicine

## 2014-12-15 ENCOUNTER — Inpatient Hospital Stay (HOSPITAL_COMMUNITY): Payer: 59

## 2014-12-15 DIAGNOSIS — B3731 Acute candidiasis of vulva and vagina: Secondary | ICD-10-CM

## 2014-12-15 DIAGNOSIS — B373 Candidiasis of vulva and vagina: Secondary | ICD-10-CM | POA: Diagnosis not present

## 2014-12-15 DIAGNOSIS — O9989 Other specified diseases and conditions complicating pregnancy, childbirth and the puerperium: Secondary | ICD-10-CM

## 2014-12-15 DIAGNOSIS — O210 Mild hyperemesis gravidarum: Secondary | ICD-10-CM | POA: Diagnosis not present

## 2014-12-15 DIAGNOSIS — R1031 Right lower quadrant pain: Secondary | ICD-10-CM | POA: Diagnosis present

## 2014-12-15 DIAGNOSIS — O98811 Other maternal infectious and parasitic diseases complicating pregnancy, first trimester: Secondary | ICD-10-CM | POA: Diagnosis not present

## 2014-12-15 DIAGNOSIS — Z3A01 Less than 8 weeks gestation of pregnancy: Secondary | ICD-10-CM | POA: Diagnosis not present

## 2014-12-15 DIAGNOSIS — O2341 Unspecified infection of urinary tract in pregnancy, first trimester: Secondary | ICD-10-CM | POA: Insufficient documentation

## 2014-12-15 DIAGNOSIS — R109 Unspecified abdominal pain: Secondary | ICD-10-CM

## 2014-12-15 DIAGNOSIS — O219 Vomiting of pregnancy, unspecified: Secondary | ICD-10-CM

## 2014-12-15 DIAGNOSIS — O26899 Other specified pregnancy related conditions, unspecified trimester: Secondary | ICD-10-CM

## 2014-12-15 LAB — CBC
HEMATOCRIT: 34 % — AB (ref 36.0–46.0)
Hemoglobin: 11 g/dL — ABNORMAL LOW (ref 12.0–15.0)
MCH: 28.2 pg (ref 26.0–34.0)
MCHC: 32.4 g/dL (ref 30.0–36.0)
MCV: 87.2 fL (ref 78.0–100.0)
PLATELETS: 298 10*3/uL (ref 150–400)
RBC: 3.9 MIL/uL (ref 3.87–5.11)
RDW: 14 % (ref 11.5–15.5)
WBC: 9.9 10*3/uL (ref 4.0–10.5)

## 2014-12-15 LAB — COMPREHENSIVE METABOLIC PANEL
ALBUMIN: 3.6 g/dL (ref 3.5–5.2)
ALK PHOS: 48 U/L (ref 39–117)
ALT: 15 U/L (ref 0–35)
ANION GAP: 9 (ref 5–15)
AST: 20 U/L (ref 0–37)
BILIRUBIN TOTAL: 1.1 mg/dL (ref 0.3–1.2)
BUN: 7 mg/dL (ref 6–23)
CHLORIDE: 103 meq/L (ref 96–112)
CO2: 25 mmol/L (ref 19–32)
Calcium: 9.1 mg/dL (ref 8.4–10.5)
Creatinine, Ser: 0.42 mg/dL — ABNORMAL LOW (ref 0.50–1.10)
Glucose, Bld: 72 mg/dL (ref 70–99)
POTASSIUM: 3.7 mmol/L (ref 3.5–5.1)
Sodium: 137 mmol/L (ref 135–145)
Total Protein: 7.8 g/dL (ref 6.0–8.3)

## 2014-12-15 LAB — URINE MICROSCOPIC-ADD ON

## 2014-12-15 LAB — WET PREP, GENITAL
Clue Cells Wet Prep HPF POC: NONE SEEN
TRICH WET PREP: NONE SEEN

## 2014-12-15 LAB — URINALYSIS, ROUTINE W REFLEX MICROSCOPIC
BILIRUBIN URINE: NEGATIVE
Glucose, UA: NEGATIVE mg/dL
HGB URINE DIPSTICK: NEGATIVE
Ketones, ur: 15 mg/dL — AB
Nitrite: POSITIVE — AB
PROTEIN: NEGATIVE mg/dL
UROBILINOGEN UA: 1 mg/dL (ref 0.0–1.0)
pH: 6 (ref 5.0–8.0)

## 2014-12-15 LAB — HCG, QUANTITATIVE, PREGNANCY: HCG, BETA CHAIN, QUANT, S: 46562 m[IU]/mL — AB (ref <5)

## 2014-12-15 LAB — RAPID URINE DRUG SCREEN, HOSP PERFORMED
AMPHETAMINES: NOT DETECTED
BARBITURATES: NOT DETECTED
BENZODIAZEPINES: NOT DETECTED
COCAINE: NOT DETECTED
OPIATES: NOT DETECTED
TETRAHYDROCANNABINOL: NOT DETECTED

## 2014-12-15 LAB — HIV ANTIBODY (ROUTINE TESTING W REFLEX): HIV 1&2 Ab, 4th Generation: NONREACTIVE

## 2014-12-15 MED ORDER — FLUCONAZOLE 150 MG PO TABS: 150.0000 mg | ORAL_TABLET | Freq: Every day | ORAL | Status: DC

## 2014-12-15 MED ORDER — PROMETHAZINE HCL 25 MG PO TABS: 25.0000 mg | ORAL_TABLET | Freq: Four times a day (QID) | ORAL | Status: DC | PRN

## 2014-12-15 MED ORDER — CEPHALEXIN 500 MG PO CAPS: 500.0000 mg | ORAL_CAPSULE | Freq: Four times a day (QID) | ORAL | Status: DC

## 2014-12-15 NOTE — Discharge Instructions (Signed)
Abdominal Pain During Pregnancy Abdominal pain is common in pregnancy. Most of the time, it does not cause harm. There are many causes of abdominal pain. Some causes are more serious than others. Some of the causes of abdominal pain in pregnancy are easily diagnosed. Occasionally, the diagnosis takes time to understand. Other times, the cause is not determined. Abdominal pain can be a sign that something is very wrong with the pregnancy, or the pain may have nothing to do with the pregnancy at all. For this reason, always tell your health care provider if you have any abdominal discomfort. HOME CARE INSTRUCTIONS  Monitor your abdominal pain for any changes. The following actions may help to alleviate any discomfort you are experiencing:  Do not have sexual intercourse or put anything in your vagina until your symptoms go away completely.  Get plenty of rest until your pain improves.  Drink clear fluids if you feel nauseous. Avoid solid food as long as you are uncomfortable or nauseous.  Only take over-the-counter or prescription medicine as directed by your health care provider.  Keep all follow-up appointments with your health care provider. SEEK IMMEDIATE MEDICAL CARE IF:  You are bleeding, leaking fluid, or passing tissue from the vagina.  You have increasing pain or cramping.  You have persistent vomiting.  You have painful or bloody urination.  You have a fever.  You notice a decrease in your baby's movements.  You have extreme weakness or feel faint.  You have shortness of breath, with or without abdominal pain.  You develop a severe headache with abdominal pain.  You have abnormal vaginal discharge with abdominal pain.  You have persistent diarrhea.  You have abdominal pain that continues even after rest, or gets worse. MAKE SURE YOU:   Understand these instructions.  Will watch your condition.  Will get help right away if you are not doing well or get  worse. Document Released: 12/10/2005 Document Revised: 09/30/2013 Document Reviewed: 07/09/2013 [facility] Patient Information 2015 [place], [place]. This information is not intended to replace advice given to you by your health care provider. Make sure you discuss any questions you have with your health care provider. Pregnancy and Sexually Transmitted Diseases A sexually transmitted disease (STD) is a disease or infection that may be passed (transmitted) from person to person, usually during sexual activity. This may happen by way of saliva, semen, blood, vaginal mucus, or urine. An STD can be caused by bacteria, viruses, or parasites.  During pregnancy, STDs can be dangerous for both you and your unborn baby. It is important to take steps to reduce your chances of getting an STD. Also, you need to be looked at by your health care provider right away if you think you may have an STD or may have been exposed to an STD. Diagnosis and treatment will depend on the type of STD. WHAT ARE SOME COMMON STDs? There are different types of STDs. Some STDs that cause problems in pregnancy include:  Gonorrhea.   Chlamydia.   Syphilis.   HIV and AIDS.   Genital herpes.   Hepatitis.   Genital warts.   Human papillomavirus (HPV). STDs that do not affect the baby include:   Trichomonas.   Pubic lice.  WHAT ARE THE POSSIBLE EFFECTS OF STDs DURING PREGNANCY? STDs can have various effects during pregnancy. STDs can cause:   Stillbirth.   Miscarriage.   Premature labor.   Premature rupture of the membranes.   Serious birth defects or deformities.  Infection of the amniotic sac.   Infections that occur after birth (postpartum) in you and the baby.   Slowed growth of the baby before birth.   Illnesses in newborns.  WHAT ARE COMMON SYMPTOMS OF STDs? Different STDs have different symptoms. Some women may not have any symptoms. If symptoms are present, they may  include:  Painful or bloody urination.   Pain in the pelvis, abdomen, vagina, anus, throat, or eyes.  A skin rash, itching, or irritation.   Growths, ulcerations, blisters, or sores in the genital and anal areas.   Fever.   Abnormal vaginal discharge with or without bad odor.   Pain or bleeding during sexual intercourse.   Yellow skin and eyes (jaundice). This is seen with hepatitis.   Swollen glands in the groin area.  Even if symptoms are not present, an STD can still be passed to another person during sexual contact.  HOW ARE STDs DIAGNOSED? Your health care provider can determine if you have an STD through different tests. These can include blood tests, urine tests, and tests performed during a pelvic exam. You should be screened for sexually transmitted illnesses (STIs), including gonorrhea and chlamydia if:   You are sexually active and are younger than 23 years old.  You are older than 23 years old and your health care provider tells you that you are at risk for this type of infection.  Your sexual activity has changed since you were last screened, and you are at an increased risk for chlamydia or gonorrhea. Ask your health care provider if you are at risk. HOW CAN I REDUCE MY RISK OF GETTING AN STD?  Take these steps to reduce your risk of getting an STD:  Use a latex condom or female condom during sexual intercourse.   Use dental dams and water-soluble lubricants during sexual activity. Do not use petroleum jelly or oils.  Avoid having multiple sex partners.  Do not have sex with someone who has other sex partners.  Do not have sex with anyone you do not know or who is at high risk for an STD.   Avoid risky sex acts that can break the skin.  Do not have sex if you have open sores on your mouth or skin.  Avoid engaging in oral and anal sex acts.   Get the hepatitis vaccine. It is safe for pregnant women.  WHAT SHOULD I DO IF I THINK I HAVE AN  STD?  See your health care provider.  Tell your sexual partner(s). They should be tested and treated for any STDs.  Do not have sex until your health care provider says it is okay. WHEN SHOULD I GET IMMEDIATE MEDICAL CARE? Contact your health care provider right away if:   You have any symptoms of an STD.  You think you or your sex partner has an STD, even if there are no symptoms.  You think you may have been exposed to an STD. Document Released: 01/17/2005 Document Revised: 04/26/2014 Document Reviewed: 07/09/2013 [facility] Patient Information 2015 [place], [place]. This information is not intended to replace advice given to you by your health care provider. Make sure you discuss any questions you have with your health care provider. Urinary Tract Infection Urinary tract infections (UTIs) can develop anywhere along your urinary tract. Your urinary tract is your body's drainage system for removing wastes and extra water. Your urinary tract includes two kidneys, two ureters, a bladder, and a urethra. Your kidneys are a pair of bean-shaped  organs. Each kidney is about the size of your fist. They are located below your ribs, one on each side of your spine. CAUSES Infections are caused by microbes, which are microscopic organisms, including fungi, viruses, and bacteria. These organisms are so small that they can only be seen through a microscope. Bacteria are the microbes that most commonly cause UTIs. SYMPTOMS  Symptoms of UTIs may vary by age and gender of the patient and by the location of the infection. Symptoms in young women typically include a frequent and intense urge to urinate and a painful, burning feeling in the bladder or urethra during urination. Older women and men are more likely to be tired, shaky, and weak and have muscle aches and abdominal pain. A fever may mean the infection is in your kidneys. Other symptoms of a kidney infection include pain in your back or sides below the  ribs, nausea, and vomiting. DIAGNOSIS To diagnose a UTI, your caregiver will ask you about your symptoms. Your caregiver also will ask to provide a urine sample. The urine sample will be tested for bacteria and white blood cells. White blood cells are made by your body to help fight infection. TREATMENT  Typically, UTIs can be treated with medication. Because most UTIs are caused by a bacterial infection, they usually can be treated with the use of antibiotics. The choice of antibiotic and length of treatment depend on your symptoms and the type of bacteria causing your infection. HOME CARE INSTRUCTIONS  If you were prescribed antibiotics, take them exactly as your caregiver instructs you. Finish the medication even if you feel better after you have only taken some of the medication.  Drink enough water and fluids to keep your urine clear or pale yellow.  Avoid caffeine, tea, and carbonated beverages. They tend to irritate your bladder.  Empty your bladder often. Avoid holding urine for long periods of time.  Empty your bladder before and after sexual intercourse.  After a bowel movement, women should cleanse from front to back. Use each tissue only once. SEEK MEDICAL CARE IF:   You have back pain.  You develop a fever.  Your symptoms do not begin to resolve within 3 days. SEEK IMMEDIATE MEDICAL CARE IF:   You have severe back pain or lower abdominal pain.  You develop chills.  You have nausea or vomiting.  You have continued burning or discomfort with urination. MAKE SURE YOU:   Understand these instructions.  Will watch your condition.  Will get help right away if you are not doing well or get worse. Document Released: 09/19/2005 Document Revised: 06/10/2012 Document Reviewed: 01/18/2012 [facility] Patient Information 2015 [place], [place]. This information is not intended to replace advice given to you by your health care provider. Make sure you discuss any questions you  have with your health care provider. Morning Sickness Morning sickness is when you feel sick to your stomach (nauseous) during pregnancy. This nauseous feeling may or may not come with vomiting. It often occurs in the morning but can be a problem any time of day. Morning sickness is most common during the first trimester, but it may continue throughout pregnancy. While morning sickness is unpleasant, it is usually harmless unless you develop severe and continual vomiting (hyperemesis gravidarum). This condition requires more intense treatment.  CAUSES  The cause of morning sickness is not completely known but seems to be related to normal hormonal changes that occur in pregnancy. RISK FACTORS You are at greater risk if you:  Experienced nausea or vomiting before your pregnancy.  Had morning sickness during a previous pregnancy.  Are pregnant with more than one baby, such as twins. TREATMENT  Do not use any medicines (prescription, over-the-counter, or herbal) for morning sickness without first talking to your health care provider. Your health care provider may prescribe or recommend:  Vitamin B6 supplements.  Anti-nausea medicines.  The herbal medicine ginger. HOME CARE INSTRUCTIONS   Only take over-the-counter or prescription medicines as directed by your health care provider.  Taking multivitamins before getting pregnant can prevent or decrease the severity of morning sickness in most women.  Eat a piece of dry toast or unsalted crackers before getting out of bed in the morning.  Eat five or six small meals a day.  Eat dry and bland foods (rice, baked potato). Foods high in carbohydrates are often helpful.  Do not drink liquids with your meals. Drink liquids between meals.  Avoid greasy, fatty, and spicy foods.  Get someone to cook for you if the smell of any food causes nausea and vomiting.  If you feel nauseous after taking prenatal vitamins, take the vitamins at night or  with a snack.  Snack on protein foods (nuts, yogurt, cheese) between meals if you are hungry.  Eat unsweetened gelatins for desserts.  Wearing an acupressure wristband (worn for sea sickness) may be helpful.  Acupuncture may be helpful.  Do not smoke.  Get a humidifier to keep the air in your house free of odors.  Get plenty of fresh air. SEEK MEDICAL CARE IF:   Your home remedies are not working, and you need medicine.  You feel dizzy or lightheaded.  You are losing weight. SEEK IMMEDIATE MEDICAL CARE IF:   You have persistent and uncontrolled nausea and vomiting.  You pass out (faint). MAKE SURE YOU:  Understand these instructions.  Will watch your condition.  Will get help right away if you are not doing well or get worse. Document Released: 01/31/2007 Document Revised: 12/15/2013 Document Reviewed: 05/27/2013 [facility] Patient Information 2015 [place]. This information is not intended to replace advice given to you by your health care provider. Make sure you discuss any questions you have with your health care provider. Monilial Vaginitis Vaginitis in a soreness, swelling and redness (inflammation) of the vagina and vulva. Monilial vaginitis is not a sexually transmitted infection. CAUSES  Yeast vaginitis is caused by yeast (candida) that is normally found in your vagina. With a yeast infection, the candida has overgrown in number to a point that upsets the chemical balance. SYMPTOMS   White, thick vaginal discharge.  Swelling, itching, redness and irritation of the vagina and possibly the lips of the vagina (vulva).  Burning or painful urination.  Painful intercourse. DIAGNOSIS  Things that may contribute to monilial vaginitis are:  Postmenopausal and virginal states.  Pregnancy.  Infections.  Being tired, sick or stressed, especially if you had monilial vaginitis in the past.  Diabetes. Good control will help lower the chance.  Birth control  pills.  Tight fitting garments.  Using bubble bath, feminine sprays, douches or deodorant tampons.  Taking certain medications that kill germs (antibiotics).  Sporadic recurrence can occur if you become ill. TREATMENT  Your caregiver will give you medication.  There are several kinds of anti monilial vaginal creams and suppositories specific for monilial vaginitis. For recurrent yeast infections, use a suppository or cream in the vagina 2 times a week, or as directed.  Anti-monilial or steroid cream for the itching  or irritation of the vulva may also be used. Get your caregiver's permission.  Painting the vagina with methylene blue solution may help if the monilial cream does not work.  Eating yogurt may help prevent monilial vaginitis. HOME CARE INSTRUCTIONS   Finish all medication as prescribed.  Do not have sex until treatment is completed or after your caregiver tells you it is okay.  Take warm sitz baths.  Do not douche.  Do not use tampons, especially scented ones.  Wear cotton underwear.  Avoid tight pants and panty hose.  Tell your sexual partner that you have a yeast infection. They should go to their caregiver if they have symptoms such as mild rash or itching.  Your sexual partner should be treated as well if your infection is difficult to eliminate.  Practice safer sex. Use condoms.  Some vaginal medications cause latex condoms to fail. Vaginal medications that harm condoms are:  Cleocin cream.  Butoconazole (Femstat).  Terconazole (Terazol) vaginal suppository.  Miconazole (Monistat) (may be purchased over the counter). SEEK MEDICAL CARE IF:   You have a temperature by mouth above 102 F (38.9 C).  The infection is getting worse after 2 days of treatment.  The infection is not getting better after 3 days of treatment.  You develop blisters in or around your vagina.  You develop vaginal bleeding, and it is not your menstrual period.  You  have pain when you urinate.  You develop intestinal problems.  You have pain with sexual intercourse. Document Released: 09/19/2005 Document Revised: 03/03/2012 Document Reviewed: 06/03/2009 [facility] Patient Information 2015 [place], [place]. This information is not intended to replace advice given to you by your health care provider. Make sure you discuss any questions you have with your health care provider.

## 2014-12-15 NOTE — MAU Note (Signed)
Patient states she has had nausea and vomiting for over one week. Has had abdominal cramping for 2 days. Denies bleeding, having a heavier than normal vaginal discharge.

## 2014-12-15 NOTE — MAU Provider Note (Signed)
Chief Complaint: Abdominal Pain and Emesis During Pregnancy   None    SUBJECTIVE HPI: Bailey Bailey is a 23 y.o. G2P1001 at [redacted]w[redacted]d by LMP who presents to maternity admissions reporting nausea and vomiting, RLQ pain and vaginal discharge with itching. Pt reports RLQ into pelvic pain onset this am, 6/10 dull cramping pain, nonradiating. N/V reports throughout pregnancy, worse over last 2d, denies nausea currently. Pt reports able to tolerate PO intake this am.  Pt also reports white vaginal discharge x 3 weeks, worse over last week. Pt reports dx and treated for Chlamydia in 09/2014, denies reinfection with that partner. Pt reports currently with new partner who reports tested negative for STDs. Pt reports white discharge is itchy and burning.  Aggravating factors: Smell triggers for vomiting Alleiviating factors: Denies OTC or home remedies for pain, n/v or discharge.  Past Medical History  Diagnosis Date  . Asthma   . Anemia   . Anxiety     panic attacks   Past Surgical History  Procedure Laterality Date  . No past surgeries    . Cesarean section  04/16/2012    Procedure: CESAREAN SECTION;  Surgeon: Meriel Pica, MD;  Location: [facility];  Service: Gynecology;  Laterality: N/A;   History   Social History  . Marital Status: Single    Spouse Name: N/A    Number of Children: N/A  . Years of Education: N/A   Occupational History  . Not on file.   Social History Main Topics  . Smoking status: Never Smoker   . Smokeless tobacco: Never Used  . Alcohol Use: No  . Drug Use: No  . Sexual Activity: Yes     Comment: last intercourse months ago   Other Topics Concern  . Not on file   Social History Narrative   No current facility-administered medications on file prior to encounter.   Current Outpatient Prescriptions on File Prior to Encounter  Medication Sig Dispense Refill  . cetirizine (ZYRTEC) 10 MG tablet Take 10 mg by mouth daily as needed. For allergies    .  albuterol (PROVENTIL HFA;VENTOLIN HFA) 108 (90 BASE) MCG/ACT inhaler Inhale 2 puffs into the lungs every 6 (six) hours as needed. For shortness of breath/asthma     No Known Allergies  ROS: Pertinent items in HPI  OBJECTIVE Blood pressure 121/56, pulse 67, temperature 98.9 F (37.2 C), temperature source Oral, resp. rate 16, height 5' 5.5" (1.664 m), weight 83.099 kg (183 lb 3.2 oz), last menstrual period 10/26/2014, SpO2 98 %, unknown if currently breastfeeding. GENERAL: Well-developed, well-nourished female in no acute distress.  HEENT: Normocephalic HEART: normal rate, S1S2 auscultated RESP: normal effort, lungs clear bil ABDOMEN: Soft, RLQ tender to palpation, + bowel sounds.  EXTREMITIES: Nontender, no edema NEURO: Alert and oriented SPECULUM EXAM: NEFG, thick cheesy white foul smelling vaginal discharge, no blood noted, cervix clean BIMANUAL: cervix visualized and closed; uterus normal size, no adnexal tenderness or masses, no CMT  LAB RESULTS Results for orders placed or performed during the hospital encounter of 12/15/14 (from the past 24 hour(s))  Urinalysis, Routine w reflex microscopic     Status: Abnormal   Collection Time: 12/15/14 10:18 AM  Result Value Ref Range   Color, Urine YELLOW YELLOW   APPearance HAZY (A) CLEAR   Specific Gravity, Urine >1.030 (H) 1.005 - 1.030   pH 6.0 5.0 - 8.0   Glucose, UA NEGATIVE NEGATIVE mg/dL   Hgb urine dipstick NEGATIVE NEGATIVE   Bilirubin Urine  NEGATIVE NEGATIVE   Ketones, ur 15 (A) NEGATIVE mg/dL   Protein, ur NEGATIVE NEGATIVE mg/dL   Urobilinogen, UA 1.0 0.0 - 1.0 mg/dL   Nitrite POSITIVE (A) NEGATIVE   Leukocytes, UA TRACE (A) NEGATIVE  Urine microscopic-add on     Status: Abnormal   Collection Time: 12/15/14 10:18 AM  Result Value Ref Range   Squamous Epithelial / LPF FEW (A) RARE   WBC, UA 7-10 <3 WBC/hpf   RBC / HPF 3-6 <3 RBC/hpf   Bacteria, UA MANY (A) RARE   Urine-Other MUCOUS PRESENT   CBC     Status: Abnormal    Collection Time: 12/15/14 11:52 AM  Result Value Ref Range   WBC 9.9 4.0 - 10.5 K/uL   RBC 3.90 3.87 - 5.11 MIL/uL   Hemoglobin 11.0 (L) 12.0 - 15.0 g/dL   HCT 16.134.0 (L) 09.636.0 - 04.546.0 %   MCV 87.2 78.0 - 100.0 fL   MCH 28.2 26.0 - 34.0 pg   MCHC 32.4 30.0 - 36.0 g/dL   RDW 40.914.0 81.111.5 - 91.415.5 %   Platelets 298 150 - 400 K/uL  Comprehensive metabolic panel     Status: Abnormal   Collection Time: 12/15/14 11:52 AM  Result Value Ref Range   Sodium 137 135 - 145 mmol/L   Potassium 3.7 3.5 - 5.1 mmol/L   Chloride 103 96 - 112 mEq/L   CO2 25 19 - 32 mmol/L   Glucose, Bld 72 70 - 99 mg/dL   BUN 7 6 - 23 mg/dL   Creatinine, Ser 7.820.42 (L) 0.50 - 1.10 mg/dL   Calcium 9.1 8.4 - 95.610.5 mg/dL   Total Protein 7.8 6.0 - 8.3 g/dL   Albumin 3.6 3.5 - 5.2 g/dL   AST 20 0 - 37 U/L   ALT 15 0 - 35 U/L   Alkaline Phosphatase 48 39 - 117 U/L   Total Bilirubin 1.1 0.3 - 1.2 mg/dL   GFR calc non Af Amer >90 >90 mL/min   GFR calc Af Amer >90 >90 mL/min   Anion gap 9 5 - 15  hCG, quantitative, pregnancy     Status: Abnormal   Collection Time: 12/15/14 11:52 AM  Result Value Ref Range   hCG, Beta Chain, Quant, S [id number] (H) <5 mIU/mL  Wet prep, genital     Status: Abnormal   Collection Time: 12/15/14 12:10 PM  Result Value Ref Range   Yeast Wet Prep HPF POC FEW (A) NONE SEEN   Trich, Wet Prep NONE SEEN NONE SEEN   Clue Cells Wet Prep HPF POC NONE SEEN NONE SEEN   WBC, Wet Prep HPF POC FEW (A) NONE SEEN    IMAGING [place] Ob Comp Less 14 Wks  12/15/2014   CLINICAL DATA:  23 year old female reportedly 7 weeks 1 day pregnant with two-day history of abdominal pain  EXAM: OBSTETRIC <14 WK US AND TRANSVAGINAL OB US  TECHNIQUE: Both transabdominal and transvaginal ultrasound examinations were performed for complete evaluation of the gestation as well as the maternal uterus, adnexal regions, and pelvic cul-de-sac. Transvaginal technique was performed to assess early pregnancy.  COMPARISON:  Prior obstetrical  ultrasound 01/14/2012  FINDINGS: Intrauterine gestational sac: Normally shaped gestational sac is identified  Yolk sac:  Present  Embryo:  Present  Cardiac Activity: Yes  Heart Rate:  128 bpm  CRL:   3.4  mm   6 w 0 d  US EDC: August 10, 2015  Maternal uterus/adnexae: Small subchorionic hemorrhage. Unremarkable appearance of the bilateral ovaries. No free fluid.  IMPRESSION: 1. Viable single intrauterine pregnancy. The fetal heart rate is 128 beats per min. By crown-rump length, the estimated gestational age is 6 weeks 0 days in the estimated date of confinement is August 10, 2015.   Electronically Signed   By: Malachy MoanHeath  McCullough M.D.   On: 12/15/2014 13:34   [place] Ob Transvaginal  12/15/2014   CLINICAL DATA:  23 year old female reportedly 7 weeks 1 day pregnant with two-day history of abdominal pain  EXAM: OBSTETRIC <14 WK US AND TRANSVAGINAL OB US  TECHNIQUE: Both transabdominal and transvaginal ultrasound examinations were performed for complete evaluation of the gestation as well as the maternal uterus, adnexal regions, and pelvic cul-de-sac. Transvaginal technique was performed to assess early pregnancy.  COMPARISON:  Prior obstetrical ultrasound 01/14/2012  FINDINGS: Intrauterine gestational sac: Normally shaped gestational sac is identified  Yolk sac:  Present  Embryo:  Present  Cardiac Activity: Yes  Heart Rate:  128 bpm  CRL:   3.4  mm   6 w 0 d                  US EDC: August 10, 2015  Maternal uterus/adnexae: Small subchorionic hemorrhage. Unremarkable appearance of the bilateral ovaries. No free fluid.  IMPRESSION: 1. Viable single intrauterine pregnancy. The fetal heart rate is 128 beats per min. By crown-rump length, the estimated gestational age is 6 weeks 0 days in the estimated date of confinement is August 10, 2015.   Electronically Signed   By: Malachy MoanHeath  McCullough M.D.   On: 12/15/2014 13:34   MDM: ultrasound, cbc, HIV, RPR, GC, Chlamydia, wet prep, UA, urine culture, UDS,  Quant  Blood type A positive  ASSESSMENT Abdominal pain Nausea UTI Vaginal Yeast infection    PLAN Discharge home Phenergan 25mg  PO Q8H PRN Nausea OTC prenatal vitamins for anemia Keflex 500mg  PO QID x 7D for UTI Diflucan 150mg  PO x1 on Day 1 and Day 7 for yeast infection  F/U with MD Marcelle OverlieHolland to establish OBGYN care    Medication List    ASK your doctor about these medications        albuterol 108 (90 BASE) MCG/ACT inhaler  Commonly known as:  PROVENTIL HFA;VENTOLIN HFA  Inhale 2 puffs into the lungs every 6 (six) hours as needed. For shortness of breath/asthma     cetirizine 10 MG tablet  Commonly known as:  ZYRTEC  Take 10 mg by mouth daily as needed. For allergies         Temple-InlandMicker Samios, FNP-S  Delbert PhenixLinda M Levern Pitter, NP

## 2014-12-16 LAB — GC/CHLAMYDIA PROBE AMP
CT Probe RNA: NEGATIVE
GC Probe RNA: NEGATIVE

## 2014-12-18 LAB — CULTURE, OB URINE
Colony Count: >=100000
Special Requests: NORMAL

## 2015-01-03 ENCOUNTER — Encounter (HOSPITAL_COMMUNITY): Payer: Self-pay | Admitting: *Deleted

## 2015-01-12 LAB — OB RESULTS CONSOLE GC/CHLAMYDIA
Chlamydia: NEGATIVE
Gonorrhea: NEGATIVE

## 2015-01-12 LAB — OB RESULTS CONSOLE HEPATITIS B SURFACE ANTIGEN: Hepatitis B Surface Ag: NEGATIVE

## 2015-01-12 LAB — OB RESULTS CONSOLE ABO/RH: RH TYPE: POSITIVE

## 2015-01-12 LAB — OB RESULTS CONSOLE RPR: RPR: NONREACTIVE

## 2015-01-12 LAB — OB RESULTS CONSOLE RUBELLA ANTIBODY, IGM: Rubella: IMMUNE

## 2015-01-12 LAB — OB RESULTS CONSOLE HIV ANTIBODY (ROUTINE TESTING): HIV: NONREACTIVE

## 2015-01-12 LAB — OB RESULTS CONSOLE ANTIBODY SCREEN: Antibody Screen: NEGATIVE

## 2015-01-22 ENCOUNTER — Inpatient Hospital Stay (HOSPITAL_COMMUNITY)
Admission: AD | Admit: 2015-01-22 | Discharge: 2015-01-22 | Disposition: A | Discharge status: Home / Self Care | Payer: Medicaid Other | Source: Ambulatory Visit | Attending: Obstetrics and Gynecology | Admitting: Obstetrics and Gynecology

## 2015-01-22 ENCOUNTER — Encounter (HOSPITAL_COMMUNITY): Payer: Self-pay | Admitting: *Deleted

## 2015-01-22 DIAGNOSIS — O2651 Maternal hypotension syndrome, first trimester: Secondary | ICD-10-CM | POA: Diagnosis not present

## 2015-01-22 DIAGNOSIS — J020 Streptococcal pharyngitis: Secondary | ICD-10-CM | POA: Diagnosis not present

## 2015-01-22 DIAGNOSIS — Z3A11 11 weeks gestation of pregnancy: Secondary | ICD-10-CM | POA: Insufficient documentation

## 2015-01-22 DIAGNOSIS — O9989 Other specified diseases and conditions complicating pregnancy, childbirth and the puerperium: Secondary | ICD-10-CM | POA: Insufficient documentation

## 2015-01-22 DIAGNOSIS — R9431 Abnormal electrocardiogram [ECG] [EKG]: Secondary | ICD-10-CM

## 2015-01-22 DIAGNOSIS — O219 Vomiting of pregnancy, unspecified: Secondary | ICD-10-CM | POA: Diagnosis not present

## 2015-01-22 DIAGNOSIS — R55 Syncope and collapse: Secondary | ICD-10-CM | POA: Insufficient documentation

## 2015-01-22 DIAGNOSIS — Z3A13 13 weeks gestation of pregnancy: Secondary | ICD-10-CM

## 2015-01-22 DIAGNOSIS — J029 Acute pharyngitis, unspecified: Secondary | ICD-10-CM

## 2015-01-22 DIAGNOSIS — O210 Mild hyperemesis gravidarum: Secondary | ICD-10-CM | POA: Diagnosis not present

## 2015-01-22 LAB — CBC WITH DIFFERENTIAL/PLATELET
BASOS ABS: 0 10*3/uL (ref 0.0–0.1)
BASOS PCT: 0 % (ref 0–1)
EOS ABS: 0 10*3/uL (ref 0.0–0.7)
EOS PCT: 0 % (ref 0–5)
HCT: 31.8 % — ABNORMAL LOW (ref 36.0–46.0)
Hemoglobin: 10.5 g/dL — ABNORMAL LOW (ref 12.0–15.0)
Lymphocytes Relative: 14 % (ref 12–46)
Lymphs Abs: 1.5 10*3/uL (ref 0.7–4.0)
MCH: 28.5 pg (ref 26.0–34.0)
MCHC: 33 g/dL (ref 30.0–36.0)
MCV: 86.2 fL (ref 78.0–100.0)
MONOS PCT: 7 % (ref 3–12)
Monocytes Absolute: 0.8 10*3/uL (ref 0.1–1.0)
NEUTROS PCT: 79 % — AB (ref 43–77)
Neutro Abs: 8.2 10*3/uL — ABNORMAL HIGH (ref 1.7–7.7)
Platelets: 234 10*3/uL (ref 150–400)
RBC: 3.69 MIL/uL — ABNORMAL LOW (ref 3.87–5.11)
RDW: 13.3 % (ref 11.5–15.5)
WBC: 10.5 10*3/uL (ref 4.0–10.5)

## 2015-01-22 LAB — URINALYSIS, ROUTINE W REFLEX MICROSCOPIC
Glucose, UA: NEGATIVE mg/dL
Hgb urine dipstick: NEGATIVE
Leukocytes, UA: NEGATIVE
Nitrite: NEGATIVE
Protein, ur: 30 mg/dL — AB
Specific Gravity, Urine: >1.03 — ABNORMAL HIGH (ref 1.005–1.030)
Urobilinogen, UA: 1 mg/dL (ref 0.0–1.0)
pH: 6 (ref 5.0–8.0)

## 2015-01-22 LAB — COMPREHENSIVE METABOLIC PANEL
ALBUMIN: 3.7 g/dL (ref 3.5–5.2)
ALT: 16 U/L (ref 0–35)
ANION GAP: 7 (ref 5–15)
AST: 16 U/L (ref 0–37)
Alkaline Phosphatase: 45 U/L (ref 39–117)
BUN: 6 mg/dL (ref 6–23)
CALCIUM: 9.3 mg/dL (ref 8.4–10.5)
CO2: 26 mmol/L (ref 19–32)
CREATININE: 0.42 mg/dL — AB (ref 0.50–1.10)
Chloride: 103 mmol/L (ref 96–112)
GLUCOSE: 75 mg/dL (ref 70–99)
Potassium: 3.5 mmol/L (ref 3.5–5.1)
SODIUM: 136 mmol/L (ref 135–145)
TOTAL PROTEIN: 8.2 g/dL (ref 6.0–8.3)
Total Bilirubin: 0.8 mg/dL (ref 0.3–1.2)

## 2015-01-22 LAB — RAPID STREP SCREEN (MED CTR MEBANE ONLY): STREPTOCOCCUS, GROUP A SCREEN (DIRECT): POSITIVE — AB

## 2015-01-22 LAB — URINE MICROSCOPIC-ADD ON

## 2015-01-22 MED ORDER — PROMETHAZINE HCL 25 MG/ML IJ SOLN
25.0000 mg | Freq: Once | INTRAVENOUS | Status: AC
  Administered 2015-01-22: 25 mg via INTRAVENOUS
  Filled 2015-01-22: qty 1

## 2015-01-22 MED ORDER — AMOXICILLIN 500 MG PO CAPS: 500.0000 mg | ORAL_CAPSULE | Freq: Three times a day (TID) | ORAL | Status: DC

## 2015-01-22 MED ORDER — LACTATED RINGERS IV BOLUS (SEPSIS)
1000.0000 mL | Freq: Once | INTRAVENOUS | Status: AC
  Administered 2015-01-22: 1000 mL via INTRAVENOUS

## 2015-01-22 MED ORDER — ACETAMINOPHEN 325 MG PO TABS
650.0000 mg | ORAL_TABLET | ORAL | Status: AC
  Administered 2015-01-22: 650 mg via ORAL
  Filled 2015-01-22: qty 2

## 2015-01-22 NOTE — Discharge Instructions (Signed)
Morning Sickness °Morning sickness is when you feel sick to your stomach (nauseous) during pregnancy. You may feel sick to your stomach and throw up (vomit). You may feel sick in the morning, but you can feel this way any time of day. Some women feel very sick to their stomach and cannot stop throwing up (hyperemesis gravidarum). °HOME CARE °· Only take medicines as told by your doctor. °· Take multivitamins as told by your doctor. Taking multivitamins before getting pregnant can stop or lessen the harshness of morning sickness. °· Eat dry toast or unsalted crackers before getting out of bed. °· Eat 5 to 6 small meals a day. °· Eat dry and bland foods like rice and baked potatoes. °· Do not drink liquids with meals. Drink between meals. °· Do not eat greasy, fatty, or spicy foods. °· Have someone cook for you if the smell of food causes you to feel sick or throw up. °· If you feel sick to your stomach after taking prenatal vitamins, take them at night or with a snack. °· Eat protein when you need a snack (nuts, yogurt, cheese). °· Eat unsweetened gelatins for dessert. °· Wear a bracelet used for sea sickness (acupressure wristband). °· Go to a doctor that puts thin needles into certain body points (acupuncture) to improve how you feel. °· Do not smoke. °· Use a humidifier to keep the air in your house free of odors. °· Get lots of fresh air. °GET HELP IF: °· You need medicine to feel better. °· You feel dizzy or lightheaded. °· You are losing weight. °GET HELP RIGHT AWAY IF:  °· You feel very sick to your stomach and cannot stop throwing up. °· You pass out (faint). °MAKE SURE YOU: °· Understand these instructions. °· Will watch your condition. °· Will get help right away if you are not doing well or get worse. °Document Released: 01/17/2005 Document Revised: 12/15/2013 Document Reviewed: 05/27/2013 °Exit[employer]® Patient Information ©2015 [employer]. This information is not intended to replace advice given to you by  your health care provider. Make sure you discuss any questions you have with your health care provider. ° °

## 2015-01-22 NOTE — MAU Note (Signed)
Spoke with Ms. Tull's Mother (Tifanie Rudd--emergency contact) about strep throat results coming back positive.  Notified her that a Rx was sent to their preferred pharmacy and that they can pick it up now.

## 2015-01-22 NOTE — MAU Provider Note (Signed)
History     CSN: [id number]  Arrival date and time: 01/22/15 1307   First Provider Initiated Contact with Patient 01/22/15 1440      No chief complaint on file. nausea/vomiting/sore throat/passing out HPI Bailey Bailey 24 y.o. G2P1001 _0  presents to MAU complaining of nausea and vomiting, sore throat and passing out.  She developed a sore throat 3 days ago and has no sick contacts.  She has been vomiting for a few weeks.  She "passed out" 3 days ago: thought she turned off the lights and went to bed but awoke apprx 90 minutes later passed out at the top of the stairs with a sore neck.  No head pain and no further neck soreness presently.  She denies abdominal pain, vaginal bleeding, dysuria.  She does feel weak and feverish.   OB History    Gravida Para Term Preterm AB TAB SAB Ectopic Multiple Living   _1 0 0 0 0 0 0 1      Past Medical History  Diagnosis Date  . Asthma   . Anemia   . Anxiety     panic attacks    Past Surgical History  Procedure Laterality Date  . No past surgeries    . Cesarean section  04/16/2012    Procedure: CESAREAN SECTION;  Surgeon: Margarette Asal, MD;  Location: [facility];  Service: Gynecology;  Laterality: N/A;    Family History  Problem Relation Age of Onset  . Kidney disease Mother   . Migraines Mother   . Diabetes Father   . Heart disease Maternal Grandmother 62    CHF    History  Substance Use Topics  . Smoking status: Never Smoker   . Smokeless tobacco: Never Used  . Alcohol Use: No    Allergies: No Known Allergies  Prescriptions prior to admission  Medication Sig Dispense Refill Last Dose  . Prenatal Vit-Fe Fumarate-FA (PRENATAL MULTIVITAMIN) TABS tablet Take 1 tablet by mouth daily.   Past Week at Unknown time  . albuterol (PROVENTIL HFA;VENTOLIN HFA) 108 (90 BASE) MCG/ACT inhaler Inhale 2 puffs into the lungs every 6 (six) hours as needed for wheezing or shortness of breath.    PRN  . cephALEXin (KEFLEX) 500 MG  capsule Take 1 capsule (500 mg total) by mouth 4 (four) times daily. (Patient not taking: Reported on 01/22/2015) 28 capsule 0   . fluconazole (DIFLUCAN) 150 MG tablet Take 1 tablet (150 mg total) by mouth daily. (Patient not taking: Reported on 01/22/2015) 1 tablet 1   . promethazine (PHENERGAN) 25 MG tablet Take 1 tablet (25 mg total) by mouth every 6 (six) hours as needed for nausea or vomiting. (Patient not taking: Reported on 01/22/2015) 30 tablet 0     ROS Pertinent ROS in HPI  Physical Exam   Blood pressure 109/66, pulse 87, temperature 98.9 F (37.2 C), temperature source Oral, resp. rate 18, height _2  (1.676 m), weight 169 lb 8 oz (76.885 kg), last menstrual period 10/26/2014, SpO2 99 %, unknown if currently breastfeeding.  Physical Exam  Constitutional: She is oriented to person, place, and time. She appears well-developed and well-nourished.  HENT:  Head: Normocephalic and atraumatic.  Eyes: EOM are normal.  Neck: Normal range of motion.  Cardiovascular: Normal rate, regular rhythm and normal heart sounds.   Respiratory: Effort normal and breath sounds normal. No respiratory distress.  GI: Soft. Bowel sounds are normal. She exhibits no distension. There is no tenderness. There is  no rebound and no guarding.  Musculoskeletal: Normal range of motion.  Neurological: She is alert and oriented to person, place, and time.  Skin: Skin is warm and dry.  Psychiatric: She has a normal mood and affect.   Results for orders placed or performed during the hospital encounter of 01/22/15 (from the past 48 hour(s))  Urinalysis, Routine w reflex microscopic     Status: Abnormal   Collection Time: 01/22/15  1:21 PM  Result Value Ref Range   Color, Urine YELLOW YELLOW   APPearance CLEAR CLEAR   Specific Gravity, Urine >1.030 (H) 1.005 - 1.030   pH 6.0 5.0 - 8.0   Glucose, UA NEGATIVE NEGATIVE mg/dL   Hgb urine dipstick NEGATIVE NEGATIVE   Bilirubin Urine SMALL (A) NEGATIVE   Ketones, ur  >80 (A) NEGATIVE mg/dL   Protein, ur 30 (A) NEGATIVE mg/dL   Urobilinogen, UA 1.0 0.0 - 1.0 mg/dL   Nitrite NEGATIVE NEGATIVE   Leukocytes, UA NEGATIVE NEGATIVE  Urine microscopic-add on     Status: Abnormal   Collection Time: 01/22/15  1:21 PM  Result Value Ref Range   Squamous Epithelial / LPF FEW (A) RARE   WBC, UA 3-6 <3 WBC/hpf   RBC / HPF 0-2 <3 RBC/hpf   Bacteria, UA MANY (A) RARE   Urine-Other MUCOUS PRESENT   CBC with Differential/Platelet     Status: Abnormal   Collection Time: 01/22/15  3:05 PM  Result Value Ref Range   WBC 10.5 4.0 - 10.5 K/uL   RBC 3.69 (L) 3.87 - 5.11 MIL/uL   Hemoglobin 10.5 (L) 12.0 - 15.0 g/dL   HCT 31.8 (L) 36.0 - 46.0 %   MCV 86.2 78.0 - 100.0 fL   MCH 28.5 26.0 - 34.0 pg   MCHC 33.0 30.0 - 36.0 g/dL   RDW 13.3 11.5 - 15.5 %   Platelets 234 150 - 400 K/uL   Neutrophils Relative % 79 (H) 43 - 77 %   Neutro Abs 8.2 (H) 1.7 - 7.7 K/uL   Lymphocytes Relative 14 12 - 46 %   Lymphs Abs 1.5 0.7 - 4.0 K/uL   Monocytes Relative 7 3 - 12 %   Monocytes Absolute 0.8 0.1 - 1.0 K/uL   Eosinophils Relative 0 0 - 5 %   Eosinophils Absolute 0.0 0.0 - 0.7 K/uL   Basophils Relative 0 0 - 1 %   Basophils Absolute 0.0 0.0 - 0.1 K/uL  Comprehensive metabolic panel     Status: Abnormal   Collection Time: 01/22/15  3:50 PM  Result Value Ref Range   Sodium 136 135 - 145 mmol/L   Potassium 3.5 3.5 - 5.1 mmol/L   Chloride 103 96 - 112 mmol/L   CO2 26 19 - 32 mmol/L   Glucose, Bld 75 70 - 99 mg/dL   BUN 6 6 - 23 mg/dL   Creatinine, Ser 0.42 (L) 0.50 - 1.10 mg/dL   Calcium 9.3 8.4 - 10.5 mg/dL   Total Protein 8.2 6.0 - 8.3 g/dL   Albumin 3.7 3.5 - 5.2 g/dL   AST 16 0 - 37 U/L   ALT 16 0 - 35 U/L   Alkaline Phosphatase 45 39 - 117 U/L   Total Bilirubin 0.8 0.3 - 1.2 mg/dL   GFR calc non Af Amer >90 >90 mL/min   GFR calc Af Amer >90 >90 mL/min    Comment: (NOTE) The eGFR has been calculated using the CKD EPI equation. This calculation has not been  validated in all clinical situations. eGFR's persistently <90 mL/min signify possible Chronic Kidney Disease.    Anion gap 7 5 - 15    MAU Course  Procedures  MDM Discussed with Dr. Helane Rima.  IV fluids with phenergan started.  Pending orders include EKG, CBC, CMET, rapid strep.  Dr. Helane Rima is in agreement with this plan and desires to be called back with abnormalities or if pt is not improved.   EKG: abnormal.  Cardiology paged for assistance with this.  PA, Tenny Craw returned call - acknowledges abnormal EKG - significant for likely septal infarct at some point.  Clinical info given to Aaron Edelman whom states he will call back with suggestion. Dr. Wynonia Lawman called at 4:45pm and reads over EKG.  He acknowledges abnormalities and states pt should be followed up outpatient within 1 [facility] at [facility].   Dr. Helane Rima made aware and she agrees with plan.  She is agreeable to discharge with OTC Tylenol/saltwater gargles for throat discomfort.  F/u in clinic for fever, no improvement.  Pt resting comfortably and she and family requesting discharge despite no results of Strep testing yet.  Assessment and Plan  A:  1. Nausea and vomiting during pregnancy   2. Acute pharyngitis, unspecified pharyngitis type   3. Syncope, unspecified syncope type   4. Nonspecific abnormal electrocardiogram (ECG) (EKG)      P: Discharge to home OTC Tylenol PRN Saltwater gargles PRN Keep appt for OB f/u Call to make appt to f/u abnormal EKG within the week at [facility] Patient may return to MAU as needed or if her condition were to change or worsen Strep test pending.  Nurse agrees to continue to watch for results and follow up.    Just after pt left the building, Strep Test results as positive.  Pt to be contacted and rx sent to pharmacy for Amoxicillin.  F/U with OB.     Paticia Stack 01/22/2015, 3:53 PM

## 2015-01-22 NOTE — MAU Note (Signed)
Was taking medication for hyperemesis, however ran out.

## 2015-01-22 NOTE — MAU Note (Signed)
Pt states began getting sick 2 weeks ago. Here today with sore throat, congestion, last attempted jello last pm. Ate "tiny spoonful". Had fever of 106 a few nights ago, however didn't come in because she thought she'd have to stay here. Denies bleeding or abnormal vaginal discharge.

## 2015-01-26 ENCOUNTER — Encounter (HOSPITAL_COMMUNITY): Payer: Self-pay | Admitting: *Deleted

## 2015-01-27 ENCOUNTER — Other Ambulatory Visit: Payer: Self-pay | Admitting: Obstetrics and Gynecology

## 2015-01-28 LAB — CYTOLOGY - PAP

## 2015-03-22 ENCOUNTER — Inpatient Hospital Stay (HOSPITAL_COMMUNITY)
Admission: AD | Admit: 2015-03-22 | Discharge: 2015-03-22 | Disposition: A | Discharge status: Home / Self Care | Payer: Medicaid Other | Source: Ambulatory Visit | Attending: Obstetrics and Gynecology | Admitting: Obstetrics and Gynecology

## 2015-03-22 ENCOUNTER — Encounter (HOSPITAL_COMMUNITY): Payer: Self-pay | Admitting: *Deleted

## 2015-03-22 DIAGNOSIS — Z3A19 19 weeks gestation of pregnancy: Secondary | ICD-10-CM | POA: Insufficient documentation

## 2015-03-22 DIAGNOSIS — R102 Pelvic and perineal pain: Secondary | ICD-10-CM | POA: Insufficient documentation

## 2015-03-22 DIAGNOSIS — O9989 Other specified diseases and conditions complicating pregnancy, childbirth and the puerperium: Secondary | ICD-10-CM

## 2015-03-22 DIAGNOSIS — N949 Unspecified condition associated with female genital organs and menstrual cycle: Secondary | ICD-10-CM | POA: Diagnosis not present

## 2015-03-22 DIAGNOSIS — R1030 Lower abdominal pain, unspecified: Secondary | ICD-10-CM | POA: Diagnosis present

## 2015-03-22 DIAGNOSIS — O26899 Other specified pregnancy related conditions, unspecified trimester: Secondary | ICD-10-CM

## 2015-03-22 LAB — URINALYSIS, ROUTINE W REFLEX MICROSCOPIC
Glucose, UA: NEGATIVE mg/dL
Hgb urine dipstick: NEGATIVE
Ketones, ur: 15 mg/dL — AB
Nitrite: NEGATIVE
PROTEIN: NEGATIVE mg/dL
Specific Gravity, Urine: 1.025 (ref 1.005–1.030)
UROBILINOGEN UA: 4 mg/dL — AB (ref 0.0–1.0)
pH: 6 (ref 5.0–8.0)

## 2015-03-22 LAB — URINE MICROSCOPIC-ADD ON

## 2015-03-22 MED ORDER — CYCLOBENZAPRINE HCL 10 MG PO TABS: 10.0000 mg | ORAL_TABLET | Freq: Every day | ORAL | Status: DC

## 2015-03-22 NOTE — Discharge Instructions (Signed)
ROUND LIGAMENT PAIN ° °Round ligament pain is most common during the second trimester. Women may have a sharp pain in their abdomen or hip area that is either on one side or both. Some women even report pain that extends into the groin area. Round ligament pain is considered a normal part of pregnancy as your body goes through many different changes. ° °What Causes Round Ligament Pain? ° °The round ligament supports the uterus and stretches during pregnancy.  It connects the front portion of the uterus to the groin. These ligaments contract and relax like muscles, but much more slowly. Any movement (including going from a sitting position to standing position quickly, laughing, or coughing) that stretches these ligaments, by making the ligaments contract quickly, can cause a woman to experience pain.  Round ligament pain should only last for a few seconds. ° °What Can Be Done To Alleviate Round Ligament Pain? ° °Rest is one of the best ways to help with this kind of pain. Changing positions slowly allows the ligaments to stretch more gradually and can help alleviate any pain. If you know that you are going to sneeze, cough, or laugh you can bend and flex your hips, which can reduce the pull on the ligaments. ° °If you are having consistent round ligament pain your health care provider may recommend daily stretching exercises.  The most common exercise is done by placing your hands and knees on the floor, lowering your head to the floor, and keeping your bottom in the air. ° °When Should I Call My Health Care Provider? ° °If the pain persists after resting, or it is accompanied by severe pain, you would want to notify your health care provider. If the pain lasts for more than a few minutes you should contact your health care provider immediately. You would also want to notify your health care provider if the pain is accompanied by any bleeding, cramping, fever, chills, nausea, vomiting, or change in vaginal  discharge. °

## 2015-03-22 NOTE — Progress Notes (Signed)
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

## 2015-03-22 NOTE — Progress Notes (Signed)
Pt states she has been having site problems prior to pregnancy. Pt states she had been meaning to get eyes checked before

## 2015-03-22 NOTE — MAU Provider Note (Signed)
History     CSN: [id number]  Arrival date and time: 03/22/15 1754   First Provider Initiated Contact with Patient 03/22/15 2055      Chief Complaint  Patient presents with  . Abdominal Cramping   HPI  [name] is a 24 y.o. G2P1001 at 4342w6d here with report of lower abdominal cramping that started last week.  Pain is described as sharp.  Last felt the pain yesterday.  Reports doing a lot of bending at work.  Works as a Advertising copywriterhousekeeper.  Denies vaginal bleeding.  Currently being treated for a yeast infection.  Concerned due to not feeling baby as much.    Past Medical History  Diagnosis Date  . Asthma   . Anemia   . Anxiety     panic attacks    Past Surgical History  Procedure Laterality Date  . No past surgeries    . Cesarean section  04/16/2012    Procedure: CESAREAN SECTION;  Surgeon: Meriel Picaichard M Holland, MD;  Location: [facility];  Service: Gynecology;  Laterality: N/A;    Family History  Problem Relation Age of Onset  . Kidney disease Mother   . Migraines Mother   . Diabetes Father   . Heart disease Maternal Grandmother 4062    CHF    History  Substance Use Topics  . Smoking status: Never Smoker   . Smokeless tobacco: Never Used  . Alcohol Use: No    Allergies: No Known Allergies  Prescriptions prior to admission  Medication Sig Dispense Refill Last Dose  . Prenatal Vit-Fe Fumarate-FA (PRENATAL MULTIVITAMIN) TABS tablet Take 1 tablet by mouth daily.   Past Week at Unknown time  . terconazole (TERAZOL 7) 0.4 % vaginal cream Place 1 applicator vaginally at bedtime.   Past Week at Unknown time  . tetrahydrozoline-zinc (VISINE-AC) 0.05-0.25 % ophthalmic solution Place 2 drops into both eyes 3 (three) times daily as needed (For allergies.).   03/21/2015 at Unknown time  . albuterol (PROVENTIL HFA;VENTOLIN HFA) 108 (90 BASE) MCG/ACT inhaler Inhale 2 puffs into the lungs every 6 (six) hours as needed for wheezing or shortness of breath.    Rescue  . amoxicillin  (AMOXIL) 500 MG capsule Take 1 capsule (500 mg total) by mouth 3 (three) times daily. (Patient not taking: Reported on 03/22/2015) 21 capsule 0 Completed Course at Unknown time  . promethazine (PHENERGAN) 25 MG tablet Take 1 tablet (25 mg total) by mouth every 6 (six) hours as needed for nausea or vomiting. (Patient not taking: Reported on 01/22/2015) 30 tablet 0 Not Taking at Unknown time    Review of Systems  Respiratory: Negative for cough and shortness of breath.   Cardiovascular: Negative for chest pain.  Gastrointestinal: Positive for abdominal pain. Negative for nausea and vomiting.  Genitourinary: Negative for dysuria, urgency and frequency.       Vaginal irritation  Neurological: Tingling: cramping.  All other systems reviewed and are negative.  Physical Exam   Blood pressure 101/70, pulse 77, temperature 97.7 F (36.5 C), temperature source Oral, resp. rate 18, height 5' 7.25" (1.708 m), weight 74.844 kg (165 lb), unknown if currently breastfeeding.  Physical Exam  Constitutional: She is oriented to person, place, and time. She appears well-developed and well-nourished. No distress.  HENT:  Head: Normocephalic.  Neck: Normal range of motion. Neck supple.  Cardiovascular: Normal rate, regular rhythm and normal heart sounds.   Respiratory: Effort normal and breath sounds normal.  GI: Soft. There is no tenderness.  Genitourinary: No bleeding in the vagina. Vaginal discharge (mucusy) found.  Cervix - closed  Musculoskeletal: Normal range of motion. She exhibits no edema.  Neurological: She is alert and oriented to person, place, and time.  Skin: Skin is warm and dry.   Doppler 154 MAU Course  Procedures  Results for orders placed or performed during the hospital encounter of 03/22/15 (from the past 24 hour(s))  Urinalysis, Routine w reflex microscopic     Status: Abnormal   Collection Time: 03/22/15  7:59 PM  Result Value Ref Range   Color, Urine YELLOW YELLOW    APPearance CLEAR CLEAR   Specific Gravity, Urine 1.025 1.005 - 1.030   pH 6.0 5.0 - 8.0   Glucose, UA NEGATIVE NEGATIVE mg/dL   Hgb urine dipstick NEGATIVE NEGATIVE   Bilirubin Urine SMALL (A) NEGATIVE   Ketones, ur 15 (A) NEGATIVE mg/dL   Protein, ur NEGATIVE NEGATIVE mg/dL   Urobilinogen, UA 4.0 (H) 0.0 - 1.0 mg/dL   Nitrite NEGATIVE NEGATIVE   Leukocytes, UA TRACE (A) NEGATIVE  Urine microscopic-add on     Status: Abnormal   Collection Time: 03/22/15  7:59 PM  Result Value Ref Range   Squamous Epithelial / LPF FEW (A) RARE   WBC, UA 0-2 <3 WBC/hpf   RBC / HPF 0-2 <3 RBC/hpf   Bacteria, UA FEW (A) RARE   Urine-Other MUCOUS PRESENT    2127 Consulted with Dr. Renaldo Fiddler and reviewed HPI/exam > discharge to home with follow-up in office.  Assessment and Plan  24 y.o. G2P1001 at [redacted]w[redacted]d IUP Round Ligament Pain  Plan: Discharge to home Provided reassurance Reviewed pregnancy precautions. Keep scheduled prenatal appointment  Bailey Bailey [address] [facility] N 03/22/2015, 8:56 PM

## 2015-03-22 NOTE — MAU Note (Addendum)
Has been cramping, started end of last wk.  Felt baby move last week, now not feeling it

## 2015-03-22 NOTE — MAU Note (Signed)
Pt states she has been having pain since last week, Pt states pain is sharp, pt states is not feeling the pain move as much.

## 2015-03-23 LAB — CULTURE, OB URINE

## 2015-04-21 ENCOUNTER — Encounter (HOSPITAL_COMMUNITY): Payer: Self-pay | Admitting: *Deleted

## 2015-04-21 ENCOUNTER — Inpatient Hospital Stay (HOSPITAL_COMMUNITY)
Admission: AD | Admit: 2015-04-21 | Discharge: 2015-04-21 | Disposition: A | Discharge status: Home / Self Care | Payer: Medicaid Other | Source: Ambulatory Visit | Attending: Obstetrics and Gynecology | Admitting: Obstetrics and Gynecology

## 2015-04-21 DIAGNOSIS — Z3A24 24 weeks gestation of pregnancy: Secondary | ICD-10-CM | POA: Insufficient documentation

## 2015-04-21 DIAGNOSIS — N898 Other specified noninflammatory disorders of vagina: Secondary | ICD-10-CM | POA: Insufficient documentation

## 2015-04-21 DIAGNOSIS — O9989 Other specified diseases and conditions complicating pregnancy, childbirth and the puerperium: Secondary | ICD-10-CM | POA: Insufficient documentation

## 2015-04-21 DIAGNOSIS — R109 Unspecified abdominal pain: Secondary | ICD-10-CM | POA: Diagnosis present

## 2015-04-21 NOTE — MAU Note (Signed)
WENT  TO LOBBY  B-ROOM

## 2015-04-21 NOTE — MAU Note (Signed)
CANNOT  COLLECT  URINE

## 2015-04-21 NOTE — MAU Note (Signed)
PT SAYS SHE DOES NOT HAVE ANY PAIN- SO IS  LEAVING  TO GO TO BED.   SIGNED  FORM

## 2015-04-21 NOTE — MAU Note (Signed)
PT  SAYS SHE HAS WHITE  D/C-  STARTED ON   2 WEEKS   AGO.  CRAMPS  STARTED   ON 4-18.    LAST SEX-    L;AST  NIGHT . [facility]   WITH    DR HOLLAND.- LAST SEEN ON 4-6.   CRAMPS  ARE  LESS NOW  THAN   IN PAST 2 WEEKS.

## 2015-07-18 NOTE — H&P (Signed)
Bailey Bailey  DICTATION # [id number] CSN# [id number]   Meriel Pica, MD 07/18/2015 1:22 PM

## 2015-07-19 NOTE — H&P (Signed)
NAME:  Bailey Bailey, Bailey Bailey          ACCOUNT NO.:  [id number]  MEDICAL RECORD NO.:  [id number]  LOCATION:                                FACILITY:  [facility]  PHYSICIAN:  Duke Salvia. Marcelle Overlie, M.D.DATE OF BIRTH:  [birth year]  DATE OF ADMISSION:  08/03/2015 DATE OF DISCHARGE:                             HISTORY & PHYSICAL   CHIEF COMPLAINT:  For repeat cesarean section at term.  HISTORY OF PRESENT ILLNESS:  A 24 year old, G2, P1-0-0-1, EDD August 10, 2015, previous primary CS for intolerance to labor in 2013, declines TOL, presents now for RCS.  This procedure including specific risks related to bleeding, infection, transfusion, adjacent organ injury, all reviewed with her which she understands and accepts.  Blood type is A positive.  One-hour GTT was 104 other than mild anemia.  Her prenatal course has been uneventful.  She has been on prescription iron daily.  PAST MEDICAL HISTORY:  ALLERGIES:  None.  CURRENT MEDICATIONS:  Prenatal vitamins and iron.  PRIOR SURGERIES:  Cesarean section 2013, otherwise negative.  For the remainder of the past medical history, please see the [facility] form for details.  PHYSICAL EXAMINATION:  VITAL SIGNS:  Temp 98.2, blood pressure 120/60. HEENT:  Unremarkable. NECK:  Supple without masses. LUNGS:  Clear. CARDIOVASCULAR:  Regular rate and rhythm without murmurs, rubs, gallop sounds. BREASTS:  Without masses. ABDOMEN:  Term fundal height.  Fetal heart rate 140, cervix was closed. EXTREMITIES:  Unremarkable. NEUROLOGIC:  Unremarkable.  IMPRESSION:  Term pregnancy for repeat cesarean section.  PLAN:  Procedure and risks discussed as above.     Lety Cullens M. Marcelle Overlie, M.D.     [facility]/MEDQ  D:  07/18/2015  T:  07/19/2015  Job:  [id number]

## 2015-08-01 ENCOUNTER — Encounter (HOSPITAL_COMMUNITY): Payer: Self-pay

## 2015-08-01 ENCOUNTER — Encounter (HOSPITAL_COMMUNITY)
Admission: RE | Admit: 2015-08-01 | Discharge: 2015-08-01 | Disposition: A | Discharge status: Home / Self Care | Payer: Medicaid Other | Source: Ambulatory Visit | Attending: Obstetrics and Gynecology | Admitting: Obstetrics and Gynecology

## 2015-08-01 DIAGNOSIS — Z01818 Encounter for other preprocedural examination: Secondary | ICD-10-CM | POA: Diagnosis present

## 2015-08-01 LAB — CBC
HEMATOCRIT: 32.1 % — AB (ref 36.0–46.0)
HEMOGLOBIN: 10.1 g/dL — AB (ref 12.0–15.0)
MCH: 27.4 pg (ref 26.0–34.0)
MCHC: 31.5 g/dL (ref 30.0–36.0)
MCV: 87.2 fL (ref 78.0–100.0)
PLATELETS: 241 10*3/uL (ref 150–400)
RBC: 3.68 MIL/uL — ABNORMAL LOW (ref 3.87–5.11)
RDW: 15 % (ref 11.5–15.5)
WBC: 8.9 10*3/uL (ref 4.0–10.5)

## 2015-08-01 LAB — TYPE AND SCREEN
ABO/RH(D): A POS
ANTIBODY SCREEN: NEGATIVE

## 2015-08-01 NOTE — Patient Instructions (Addendum)
Your procedure is scheduled on: August 03, 2015  Enter through the Main Entrance of [facility] at:  11:30 am   Pick up the phone at the desk and dial [phone].  Call this number if you have problems the morning of surgery: [phone].  Remember: Do NOT eat food: after midnight on Tuesday  Do NOT drink clear liquids after:  9:00 am day of surgery  Take these medicines the morning of surgery with a SIP OF WATER:  None Bring inhaler with you day of surgery   Do NOT wear jewelry (body piercing), metal hair clips/bobby pins, or nail polish. Do NOT wear lotions, powders, or perfumes.  You may wear deoderant. Do NOT shave for 48 hours prior to surgery. Do NOT bring valuables to the hospital. Leave suitcase in car.  After surgery it may be brought to your room.  For patients admitted to the hospital, checkout time is 11:00 AM the day of discharge.

## 2015-08-02 LAB — RPR: RPR Ser Ql: NONREACTIVE

## 2015-08-02 MED ORDER — DEXTROSE 5 % IV SOLN
2.0000 g | INTRAVENOUS | Status: AC
  Administered 2015-08-03: 2 g via INTRAVENOUS
  Filled 2015-08-02: qty 2

## 2015-08-03 ENCOUNTER — Encounter (HOSPITAL_COMMUNITY)
Admission: RE | Disposition: A | Discharge status: Home / Self Care | Payer: Self-pay | Source: Ambulatory Visit | Attending: Obstetrics and Gynecology

## 2015-08-03 ENCOUNTER — Inpatient Hospital Stay (HOSPITAL_COMMUNITY): Payer: Medicaid Other | Admitting: Anesthesiology

## 2015-08-03 ENCOUNTER — Inpatient Hospital Stay (HOSPITAL_COMMUNITY)
Admission: RE | Admit: 2015-08-03 | Discharge: 2015-08-06 | DRG: 766 | Disposition: A | Discharge status: Home / Self Care | Payer: Medicaid Other | Source: Ambulatory Visit | Attending: Obstetrics and Gynecology | Admitting: Obstetrics and Gynecology

## 2015-08-03 ENCOUNTER — Encounter (HOSPITAL_COMMUNITY): Payer: Self-pay | Admitting: *Deleted

## 2015-08-03 DIAGNOSIS — D649 Anemia, unspecified: Secondary | ICD-10-CM | POA: Diagnosis not present

## 2015-08-03 DIAGNOSIS — O3421 Maternal care for scar from previous cesarean delivery: Principal | ICD-10-CM | POA: Diagnosis present

## 2015-08-03 DIAGNOSIS — O9902 Anemia complicating childbirth: Secondary | ICD-10-CM | POA: Diagnosis present

## 2015-08-03 DIAGNOSIS — Z98891 History of uterine scar from previous surgery: Secondary | ICD-10-CM

## 2015-08-03 HISTORY — DX: Personal history of other mental and behavioral disorders: Z87.59

## 2015-08-03 HISTORY — DX: Personal history of other infectious and parasitic diseases: Z86.19

## 2015-08-03 HISTORY — DX: Partial loss of teeth, unspecified cause, unspecified class: K08.409

## 2015-08-03 HISTORY — DX: Personal history of other mental and behavioral disorders: Z86.59

## 2015-08-03 SURGERY — Surgical Case
Anesthesia: Spinal | Site: Abdomen

## 2015-08-03 MED ORDER — DIPHENHYDRAMINE HCL 50 MG/ML IJ SOLN: 12.5000 mg | INTRAMUSCULAR | Status: DC | PRN

## 2015-08-03 MED ORDER — SODIUM CHLORIDE 0.9 % IJ SOLN: 3.0000 mL | INTRAMUSCULAR | Status: DC | PRN

## 2015-08-03 MED ORDER — OXYTOCIN 40 UNITS IN LACTATED RINGERS INFUSION - SIMPLE MED: 62.5000 mL/h | INTRAVENOUS | Status: AC

## 2015-08-03 MED ORDER — BISACODYL 10 MG RE SUPP: 10.0000 mg | Freq: Every day | RECTAL | Status: DC | PRN

## 2015-08-03 MED ORDER — MORPHINE SULFATE 0.5 MG/ML IJ SOLN
INTRAMUSCULAR | Status: AC
  Filled 2015-08-03: qty 10

## 2015-08-03 MED ORDER — ERYTHROMYCIN 5 MG/GM OP OINT
TOPICAL_OINTMENT | OPHTHALMIC | Status: AC
  Filled 2015-08-03: qty 1

## 2015-08-03 MED ORDER — MORPHINE SULFATE (PF) 0.5 MG/ML IJ SOLN
INTRAMUSCULAR | Status: DC | PRN
  Administered 2015-08-03: .1 mg via INTRATHECAL

## 2015-08-03 MED ORDER — PRENATAL MULTIVITAMIN CH
1.0000 | ORAL_TABLET | Freq: Every day | ORAL | Status: DC
Start: 2015-08-04 — End: 2015-08-06
  Administered 2015-08-04 – 2015-08-05 (×2): 1 via ORAL
  Filled 2015-08-03 (×2): qty 1

## 2015-08-03 MED ORDER — ONDANSETRON HCL 4 MG/2ML IJ SOLN
INTRAMUSCULAR | Status: DC | PRN
  Administered 2015-08-03: 4 mg via INTRAVENOUS

## 2015-08-03 MED ORDER — OXYCODONE-ACETAMINOPHEN 5-325 MG PO TABS
2.0000 | ORAL_TABLET | ORAL | Status: DC | PRN
  Administered 2015-08-04 – 2015-08-06 (×2): 2 via ORAL
  Filled 2015-08-03 (×3): qty 2

## 2015-08-03 MED ORDER — WITCH HAZEL-GLYCERIN EX PADS: 1.0000 "application " | MEDICATED_PAD | CUTANEOUS | Status: DC | PRN

## 2015-08-03 MED ORDER — ACETAMINOPHEN 500 MG PO TABS
1000.0000 mg | ORAL_TABLET | Freq: Four times a day (QID) | ORAL | Status: AC
  Filled 2015-08-03: qty 2

## 2015-08-03 MED ORDER — SODIUM CHLORIDE 0.9 % IV SOLN: 250.0000 mL | INTRAVENOUS | Status: DC

## 2015-08-03 MED ORDER — SIMETHICONE 80 MG PO CHEW: 80.0000 mg | CHEWABLE_TABLET | ORAL | Status: DC | PRN

## 2015-08-03 MED ORDER — 0.9 % SODIUM CHLORIDE (POUR BTL) OPTIME
TOPICAL | Status: DC | PRN
  Administered 2015-08-03: 1000 mL

## 2015-08-03 MED ORDER — NALBUPHINE HCL 10 MG/ML IJ SOLN
5.0000 mg | INTRAMUSCULAR | Status: DC | PRN
  Administered 2015-08-03: 5 mg via INTRAVENOUS
  Filled 2015-08-03: qty 1

## 2015-08-03 MED ORDER — NALBUPHINE HCL 10 MG/ML IJ SOLN
5.0000 mg | INTRAMUSCULAR | Status: DC | PRN
  Administered 2015-08-03: 5 mg via SUBCUTANEOUS
  Filled 2015-08-03: qty 1

## 2015-08-03 MED ORDER — OXYCODONE-ACETAMINOPHEN 5-325 MG PO TABS
1.0000 | ORAL_TABLET | ORAL | Status: DC | PRN
  Administered 2015-08-04 – 2015-08-05 (×7): 1 via ORAL
  Filled 2015-08-03 (×8): qty 1

## 2015-08-03 MED ORDER — LACTATED RINGERS IV SOLN
INTRAVENOUS | Status: DC
  Administered 2015-08-03 (×2): via INTRAVENOUS

## 2015-08-03 MED ORDER — MEPERIDINE HCL 25 MG/ML IJ SOLN: 6.2500 mg | INTRAMUSCULAR | Status: DC | PRN

## 2015-08-03 MED ORDER — SCOPOLAMINE 1 MG/3DAYS TD PT72
MEDICATED_PATCH | TRANSDERMAL | Status: AC
  Administered 2015-08-03: 1.5 mg via TRANSDERMAL
  Filled 2015-08-03: qty 1

## 2015-08-03 MED ORDER — SIMETHICONE 80 MG PO CHEW
80.0000 mg | CHEWABLE_TABLET | Freq: Three times a day (TID) | ORAL | Status: DC
  Administered 2015-08-04 – 2015-08-06 (×7): 80 mg via ORAL
  Filled 2015-08-03 (×7): qty 1

## 2015-08-03 MED ORDER — ACETAMINOPHEN 160 MG/5ML PO SOLN: 975.0000 mg | Freq: Once | ORAL | Status: DC

## 2015-08-03 MED ORDER — NALBUPHINE HCL 10 MG/ML IJ SOLN: 5.0000 mg | Freq: Once | INTRAMUSCULAR | Status: DC | PRN

## 2015-08-03 MED ORDER — LACTATED RINGERS IV SOLN
Freq: Once | INTRAVENOUS | Status: AC
Start: 2015-08-03 — End: 2015-08-03
  Administered 2015-08-03: 12:00:00 via INTRAVENOUS

## 2015-08-03 MED ORDER — FENTANYL CITRATE (PF) 100 MCG/2ML IJ SOLN
INTRAMUSCULAR | Status: AC
  Filled 2015-08-03: qty 2

## 2015-08-03 MED ORDER — NALOXONE HCL 0.4 MG/ML IJ SOLN: 0.4000 mg | INTRAMUSCULAR | Status: DC | PRN

## 2015-08-03 MED ORDER — OXYTOCIN 10 UNIT/ML IJ SOLN
INTRAMUSCULAR | Status: AC
  Filled 2015-08-03: qty 4

## 2015-08-03 MED ORDER — DIBUCAINE 1 % RE OINT: 1.0000 "application " | TOPICAL_OINTMENT | RECTAL | Status: DC | PRN

## 2015-08-03 MED ORDER — SCOPOLAMINE 1 MG/3DAYS TD PT72
1.0000 | MEDICATED_PATCH | Freq: Once | TRANSDERMAL | Status: DC
  Administered 2015-08-03: 1.5 mg via TRANSDERMAL

## 2015-08-03 MED ORDER — DIPHENHYDRAMINE HCL 25 MG PO CAPS: 25.0000 mg | ORAL_CAPSULE | Freq: Four times a day (QID) | ORAL | Status: DC | PRN

## 2015-08-03 MED ORDER — GLYCOPYRROLATE 0.2 MG/ML IJ SOLN
INTRAMUSCULAR | Status: DC | PRN
  Administered 2015-08-03: 0.1 mg via INTRAVENOUS

## 2015-08-03 MED ORDER — OXYTOCIN 10 UNIT/ML IJ SOLN
40.0000 [IU] | INTRAVENOUS | Status: DC | PRN
  Administered 2015-08-03: 40 [IU] via INTRAVENOUS

## 2015-08-03 MED ORDER — EPHEDRINE SULFATE 50 MG/ML IJ SOLN
INTRAMUSCULAR | Status: DC | PRN
  Administered 2015-08-03 (×2): 5 mg via INTRAVENOUS

## 2015-08-03 MED ORDER — TETANUS-DIPHTH-ACELL PERTUSSIS 5-2.5-18.5 LF-MCG/0.5 IM SUSP
0.5000 mL | Freq: Once | INTRAMUSCULAR | Status: AC
  Administered 2015-08-05: 0.5 mL via INTRAMUSCULAR
  Filled 2015-08-03: qty 0.5

## 2015-08-03 MED ORDER — ZOLPIDEM TARTRATE 5 MG PO TABS: 5.0000 mg | ORAL_TABLET | Freq: Every evening | ORAL | Status: DC | PRN

## 2015-08-03 MED ORDER — DIPHENHYDRAMINE HCL 25 MG PO CAPS
25.0000 mg | ORAL_CAPSULE | ORAL | Status: DC | PRN
  Administered 2015-08-03: 25 mg via ORAL
  Filled 2015-08-03: qty 1

## 2015-08-03 MED ORDER — HYDROMORPHONE HCL 1 MG/ML IJ SOLN
0.2500 mg | INTRAMUSCULAR | Status: DC | PRN
  Administered 2015-08-03 (×2): 0.25 mg via INTRAVENOUS

## 2015-08-03 MED ORDER — FLEET ENEMA 7-19 GM/118ML RE ENEM: 1.0000 | ENEMA | Freq: Every day | RECTAL | Status: DC | PRN

## 2015-08-03 MED ORDER — LACTATED RINGERS IV SOLN
INTRAVENOUS | Status: DC | PRN
  Administered 2015-08-03: 14:00:00 via INTRAVENOUS

## 2015-08-03 MED ORDER — ACETAMINOPHEN 325 MG PO TABS
650.0000 mg | ORAL_TABLET | ORAL | Status: DC | PRN
  Administered 2015-08-03: 650 mg via ORAL
  Filled 2015-08-03: qty 2

## 2015-08-03 MED ORDER — MENTHOL 3 MG MT LOZG: 1.0000 | LOZENGE | OROMUCOSAL | Status: DC | PRN

## 2015-08-03 MED ORDER — MEASLES, MUMPS & RUBELLA VAC ~~LOC~~ INJ
0.5000 mL | INJECTION | Freq: Once | SUBCUTANEOUS | Status: DC
  Filled 2015-08-03: qty 0.5

## 2015-08-03 MED ORDER — SCOPOLAMINE 1 MG/3DAYS TD PT72
1.0000 | MEDICATED_PATCH | Freq: Once | TRANSDERMAL | Status: DC
  Filled 2015-08-03: qty 1

## 2015-08-03 MED ORDER — SIMETHICONE 80 MG PO CHEW
80.0000 mg | CHEWABLE_TABLET | ORAL | Status: DC
  Administered 2015-08-03 – 2015-08-05 (×3): 80 mg via ORAL
  Filled 2015-08-03 (×3): qty 1

## 2015-08-03 MED ORDER — KETOROLAC TROMETHAMINE 30 MG/ML IJ SOLN
30.0000 mg | Freq: Once | INTRAMUSCULAR | Status: AC
  Administered 2015-08-03: 30 mg via INTRAVENOUS

## 2015-08-03 MED ORDER — KETOROLAC TROMETHAMINE 30 MG/ML IJ SOLN
INTRAMUSCULAR | Status: AC
  Filled 2015-08-03: qty 1

## 2015-08-03 MED ORDER — NALOXONE HCL 1 MG/ML IJ SOLN
1.0000 ug/kg/h | INTRAVENOUS | Status: DC | PRN
  Filled 2015-08-03: qty 2

## 2015-08-03 MED ORDER — HYDROMORPHONE HCL 1 MG/ML IJ SOLN
INTRAMUSCULAR | Status: AC
  Administered 2015-08-03: 0.25 mg via INTRAVENOUS
  Filled 2015-08-03: qty 1

## 2015-08-03 MED ORDER — PHENYLEPHRINE 8 MG IN D5W 100 ML (0.08MG/ML) PREMIX OPTIME
INJECTION | INTRAVENOUS | Status: DC | PRN
  Administered 2015-08-03: 60 ug/min via INTRAVENOUS

## 2015-08-03 MED ORDER — SENNOSIDES-DOCUSATE SODIUM 8.6-50 MG PO TABS
2.0000 | ORAL_TABLET | ORAL | Status: DC
  Administered 2015-08-03 – 2015-08-05 (×3): 2 via ORAL
  Filled 2015-08-03 (×3): qty 2

## 2015-08-03 MED ORDER — VITAMIN K1 1 MG/0.5ML IJ SOLN
INTRAMUSCULAR | Status: AC
  Filled 2015-08-03: qty 0.5

## 2015-08-03 MED ORDER — ONDANSETRON HCL 4 MG/2ML IJ SOLN: 4.0000 mg | Freq: Three times a day (TID) | INTRAMUSCULAR | Status: DC | PRN

## 2015-08-03 MED ORDER — PHENYLEPHRINE HCL 10 MG/ML IJ SOLN
INTRAMUSCULAR | Status: DC | PRN
  Administered 2015-08-03: 40 ug via INTRAVENOUS

## 2015-08-03 MED ORDER — FENTANYL CITRATE (PF) 100 MCG/2ML IJ SOLN
INTRAMUSCULAR | Status: DC | PRN
  Administered 2015-08-03: 25 ug via INTRATHECAL

## 2015-08-03 MED ORDER — SODIUM CHLORIDE 0.9 % IJ SOLN
3.0000 mL | Freq: Two times a day (BID) | INTRAMUSCULAR | Status: DC
Start: 2015-08-04 — End: 2015-08-06

## 2015-08-03 MED ORDER — LANOLIN HYDROUS EX OINT: 1.0000 "application " | TOPICAL_OINTMENT | CUTANEOUS | Status: DC | PRN

## 2015-08-03 MED ORDER — PHENYLEPHRINE 8 MG IN D5W 100 ML (0.08MG/ML) PREMIX OPTIME
INJECTION | INTRAVENOUS | Status: AC
  Filled 2015-08-03: qty 100

## 2015-08-03 MED ORDER — IBUPROFEN 800 MG PO TABS
800.0000 mg | ORAL_TABLET | Freq: Three times a day (TID) | ORAL | Status: DC | PRN
  Administered 2015-08-03 – 2015-08-05 (×7): 800 mg via ORAL
  Filled 2015-08-03 (×7): qty 1

## 2015-08-03 MED ORDER — ONDANSETRON HCL 4 MG/2ML IJ SOLN
INTRAMUSCULAR | Status: AC
  Filled 2015-08-03: qty 2

## 2015-08-03 SURGICAL SUPPLY — 28 items
APL SKNCLS STERI-STRIP NONHPOA (GAUZE/BANDAGES/DRESSINGS) ×1
BENZOIN TINCTURE PRP APPL 2/3 (GAUZE/BANDAGES/DRESSINGS) ×3 IMPLANT
CLAMP CORD UMBIL (MISCELLANEOUS) ×2 IMPLANT
CLOSURE WOUND 1/2 X4 (GAUZE/BANDAGES/DRESSINGS) ×1
CLOTH BEACON ORANGE TIMEOUT ST (SAFETY) ×3 IMPLANT
DRAPE SHEET LG 3/4 BI-LAMINATE (DRAPES) ×2 IMPLANT
DRSG OPSITE POSTOP 4X10 (GAUZE/BANDAGES/DRESSINGS) ×3 IMPLANT
DURAPREP 26ML APPLICATOR (WOUND CARE) ×3 IMPLANT
ELECT REM PT RETURN 9FT ADLT (ELECTROSURGICAL) ×3
ELECTRODE REM PT RTRN 9FT ADLT (ELECTROSURGICAL) ×1 IMPLANT
EXTRACTOR VACUUM M CUP 4 TUBE (SUCTIONS) IMPLANT
EXTRACTOR VACUUM M CUP 4' TUBE (SUCTIONS)
GLOVE BIO SURGEON STRL SZ7 (GLOVE) ×3 IMPLANT
GOWN STRL REUS W/TWL LRG LVL3 (GOWN DISPOSABLE) ×6 IMPLANT
KIT ABG SYR 3ML LUER SLIP (SYRINGE) IMPLANT
NDL HYPO 25X5/8 SAFETYGLIDE (NEEDLE) ×1 IMPLANT
NEEDLE HYPO 25X5/8 SAFETYGLIDE (NEEDLE) ×3 IMPLANT
NS IRRIG 1000ML POUR BTL (IV SOLUTION) ×3 IMPLANT
PACK C SECTION WH (CUSTOM PROCEDURE TRAY) ×3 IMPLANT
PAD OB MATERNITY 4.3X12.25 (PERSONAL CARE ITEMS) ×3 IMPLANT
STRIP CLOSURE SKIN 1/2X4 (GAUZE/BANDAGES/DRESSINGS) ×2 IMPLANT
SUT CHROMIC 0 CTX 36 (SUTURE) ×9 IMPLANT
SUT MON AB 4-0 PS1 27 (SUTURE) ×3 IMPLANT
SUT PDS AB 0 CT1 27 (SUTURE) ×6 IMPLANT
SUT VIC AB 3-0 CT1 27 (SUTURE) ×6
SUT VIC AB 3-0 CT1 TAPERPNT 27 (SUTURE) ×2 IMPLANT
TOWEL OR 17X24 6PK STRL BLUE (TOWEL DISPOSABLE) ×3 IMPLANT
TRAY FOLEY CATH SILVER 14FR (SET/KITS/TRAYS/PACK) ×3 IMPLANT

## 2015-08-03 NOTE — Anesthesia Preprocedure Evaluation (Signed)
Anesthesia Evaluation  Patient identified by MRN, date of birth, ID band Patient awake    Reviewed: Allergy & Precautions, H&P , Patient's Chart, lab work & pertinent test results  Airway Mallampati: II  TM Distance: >3 FB Neck ROM: full    Dental no notable dental hx.    Pulmonary asthma ,  breath sounds clear to auscultation  Pulmonary exam normal       Cardiovascular Exercise Tolerance: Good Rhythm:regular Rate:Normal     Neuro/Psych    GI/Hepatic   Endo/Other    Renal/GU      Musculoskeletal   Abdominal   Peds  Hematology  (+) anemia ,   Anesthesia Other Findings   Reproductive/Obstetrics                             Anesthesia Physical Anesthesia Plan  ASA: II  Anesthesia Plan: Spinal   Post-op Pain Management:    Induction:   Airway Management Planned:   Additional Equipment:   Intra-op Plan:   Post-operative Plan:   Informed Consent: I have reviewed the patients History and Physical, chart, labs and discussed the procedure including the risks, benefits and alternatives for the proposed anesthesia with the patient or authorized representative who has indicated his/her understanding and acceptance.   Dental Advisory Given  Plan Discussed with: CRNA  Anesthesia Plan Comments: (Lab work confirmed with CRNA in room. Platelets okay. Discussed spinal anesthetic, and patient consents to the procedure:  included risk of possible headache,backache, failed block, allergic reaction, and nerve injury. This patient was asked if she had any questions or concerns before the procedure started. )        Anesthesia Quick Evaluation

## 2015-08-03 NOTE — Progress Notes (Signed)
The patient was re-examined with no change in status 

## 2015-08-03 NOTE — Anesthesia Postprocedure Evaluation (Signed)
  Anesthesia Post-op Note  Patient: Bailey Bailey  Procedure(s) Performed: Procedure(s) with comments: CESAREAN SECTION (N/A) - Repeat edc 08/10/15 nkda  Patient is awake, responsive, moving her legs, and has signs of resolution of her numbness. Pain and nausea are reasonably well controlled. Vital signs are stable and clinically acceptable. Oxygen saturation is clinically acceptable. There are no apparent anesthetic complications at this time. Patient is ready for discharge.

## 2015-08-03 NOTE — Transfer of Care (Signed)
Immediate Anesthesia Transfer of Care Note  Patient: Bailey Bailey  Procedure(s) Performed: Procedure(s) with comments: CESAREAN SECTION (N/A) - Repeat edc 08/10/15 nkda  Patient Location: PACU  Anesthesia Type:Spinal  Level of Consciousness: awake, alert , oriented and patient cooperative  Airway & Oxygen Therapy: Patient Spontanous Breathing  Post-op Assessment: Report given to RN and Post -op Vital signs reviewed and stable  Post vital signs: Reviewed and stable  Last Vitals:  Filed Vitals:   08/03/15 1134  BP: 103/63  Pulse: 85  Temp: 36.9 C  Resp: 18    Complications: No apparent anesthesia complications

## 2015-08-03 NOTE — Op Note (Signed)
Preop diagnosis: Repeat cesarean section at term  Postoperative diagnosis: Same  Procedure: Repeat low transverse cesarean section  Surgeon: Marcelle Overlie  Anesthesia: Spinal  EBL: 700 cc  Complications: None  Drains: Foley catheter  Procedure and findings:  Patient was taken the operating room after an adequate level of spinal unaesthetic was obtained with the patient in left tilt position the abdomen prepped and draped in the usual fashion, Foley catheter positioned. Appropriate timeouts were taken at that point. Transverse incision was made excising the old scar, this was carried to the fascia which was incised and extended transversely. Rectus muscle divided in the midline, peritoneum entered superiorly without incident and extended vertically. The bladder blade was positioned. Vesicouterine serosa was incised there was a moderate amount of scarring this was dissected down for a short distance and the bladder blade was repositioned. Transverse incision made in the lower segment thin meconium was noted this was extended with bandage scissors the parent patient then delivered of a healthy female, the infant was suctioned cord clamped and passed the pediatric team for further care. Cord pH and blood specimens removed obtained. The placenta was then delivered manually intact, uterus exteriorized cavity wiped clean with laparotomy pack closure obtained the first layer of 0 chromic in a locked fashion followed by number K layer of 0 chromic. This is hemostatic. Bilateral tubes and ovaries were normal the bladder flap area was intact and hemostatic. Prior to closure sponge, needle, history counts reported as correct 2. Peritoneum closed with a with a running 30 Vicryls suture, rectus muscles reapproximated midline with 30 Vicryls suture. A 0 PDS was then used to close the fascia transversely. The skin was undermined to reduce tension this was made hemostatic with the Bovie subcutaneous layer was moderately  thin was not closed separately 4-0 Monocryl subarticular closure along with Steri-Strips and a pressure dressing. Clear urine noted at the end of case she tolerated this well, went to recovery room in good condition.  Dictated with [facility]  Bailey Bailey Bailey Bailey M.D.

## 2015-08-03 NOTE — Anesthesia Procedure Notes (Signed)

## 2015-08-04 ENCOUNTER — Encounter (HOSPITAL_COMMUNITY): Payer: Self-pay | Admitting: Obstetrics and Gynecology

## 2015-08-04 LAB — CBC
HEMATOCRIT: 28 % — AB (ref 36.0–46.0)
Hemoglobin: 9.1 g/dL — ABNORMAL LOW (ref 12.0–15.0)
MCH: 28 pg (ref 26.0–34.0)
MCHC: 32.5 g/dL (ref 30.0–36.0)
MCV: 86.2 fL (ref 78.0–100.0)
Platelets: 192 10*3/uL (ref 150–400)
RBC: 3.25 MIL/uL — AB (ref 3.87–5.11)
RDW: 15 % (ref 11.5–15.5)
WBC: 10.6 10*3/uL — AB (ref 4.0–10.5)

## 2015-08-04 NOTE — Progress Notes (Signed)
CSW acknowledges consult for history of anxiety and postpartum depression.  CSW attempted to complete assessment, but MOB had numerous visitors in her room. CSW offered to return at a later time, MOB agreeable.  CSW to follow up prior to discharge to complete assessment.

## 2015-08-04 NOTE — Lactation Note (Signed)
This note was copied from the chart of Bailey Naveyah Iacovelli. Lactation Consultation Note; Initial visit with mom. Has baby latched to breast when I went in. Reports she breast fed last baby but had to give formula because she wasn't making enough milk and then supply decreased even more. Encouraged frequent nursing, whenever baby showing feeding cues. Mom complaining of lots of cramping with breastfeeding. No further questions at present. To call for assist prn. BF brochure given with resources for support after DC.  Patient Name: Bailey Bailey Today's Date: 08/04/2015 Reason for consult: Initial assessment   Maternal Data Formula Feeding for Exclusion: No Has patient been taught Hand Expression?: Yes Does the patient have breastfeeding experience prior to this delivery?: Yes  Feeding Feeding Type: Breast Fed Length of feed: 20 min  LATCH Score/Interventions Latch: Grasps breast easily, tongue down, lips flanged, rhythmical sucking.  Audible Swallowing: A few with stimulation  Type of Nipple: Everted at rest and after stimulation  Comfort (Breast/Nipple): Soft / non-tender     Hold (Positioning): Assistance needed to correctly position infant at breast and maintain latch. Intervention(s): Breastfeeding basics reviewed;Position options;Skin to skin  LATCH Score: 8  Lactation Tools Discussed/Used     Consult Status Consult Status: Follow-up Date: 08/05/15 Follow-up type: In-patient    Pamelia Hoit 08/04/2015, 2:17 PM

## 2015-08-04 NOTE — Anesthesia Postprocedure Evaluation (Signed)
  Anesthesia Post-op Note  Patient: Bailey Bailey  Procedure(s) Performed: Procedure(s) with comments: CESAREAN SECTION (N/A) - Repeat edc 08/10/15 nkda  Patient Location: Mother/Baby  Anesthesia Type:Spinal  Level of Consciousness: awake, alert , oriented and patient cooperative  Airway and Oxygen Therapy: Patient Spontanous Breathing  Post-op Pain: mild  Post-op Assessment: Patient's Cardiovascular Status Stable, Respiratory Function Stable, No headache, No backache and Patient able to bend at knees;ambulating without problem;numbness resolved LLE Motor Response: Purposeful movement LLE Sensation: Tingling RLE Motor Response: Purposeful movement RLE Sensation: Tingling      Post-op Vital Signs: Reviewed and stable  Last Vitals:  Filed Vitals:   08/04/15 0513  BP: 97/46  Pulse: 67  Temp: 36.7 C  Resp: 18    Complications: No apparent anesthesia complications

## 2015-08-04 NOTE — Progress Notes (Signed)
Subjective: Postpartum Day 1: Cesarean Delivery Patient reports tolerating PO.    Objective: Vital signs in last 24 hours: Temp:  [97.4 F (36.3 C)-98.4 F (36.9 C)] 98 F (36.7 C) (08/11 0513) Pulse Rate:  [62-98] 67 (08/11 0513) Resp:  [18-20] 18 (08/11 0513) BP: (91-112)/(37-64) 97/46 mmHg (08/11 0513) SpO2:  [98 %-100 %] 100 % (08/11 0513)  Physical Exam:  General: alert, cooperative and no distress Lochia: appropriate Uterine Fundus: firm Incision: healing well DVT Evaluation: No evidence of DVT seen on physical exam.   Recent Labs  08/01/15 1440 08/04/15 0610  HGB 10.1* 9.1*  HCT 32.1* 28.0*    Assessment/Plan: Status post Cesarean section. Doing well postoperatively.  Continue current care.  Bailey Bailey,Bailey Bailey 08/04/2015, 7:40 AM

## 2015-08-04 NOTE — Addendum Note (Signed)
Addendum  created 08/04/15 0816 by Earmon Phoenix, CRNA   Modules edited: Notes Section   Notes Section:  File: [id number]

## 2015-08-05 DIAGNOSIS — Z98891 History of uterine scar from previous surgery: Secondary | ICD-10-CM

## 2015-08-05 NOTE — Progress Notes (Signed)
Subjective: Postpartum Day 2: Cesarean Delivery Patient reports tolerating PO.  Reports some pressure with urination. Denies dysuria  Objective: Vital signs in last 24 hours: Temp:  [97.4 F (36.3 C)-98.3 F (36.8 C)] 98.3 F (36.8 C) (08/12 0500) Pulse Rate:  [69-75] 72 (08/12 0500) Resp:  [18-20] 18 (08/12 0500) BP: (84-109)/(49-59) 96/57 mmHg (08/12 0500) SpO2:  [100 %] 100 % (08/11 1301)  Physical Exam:  General: alert and cooperative Lochia: appropriate Uterine Fundus: firm Incision: small old drainage noted on honeycomb bandage DVT Evaluation: No evidence of DVT seen on physical exam. Negative Homan's sign. No cords or calf tenderness. No significant calf/ankle edema.   Recent Labs  08/04/15 0610  HGB 9.1*  HCT 28.0*    Assessment/Plan: Status post Cesarean section. Doing well postoperatively.  Continue current care.  Ryla Cauthon G 08/05/2015, 7:57 AM

## 2015-08-05 NOTE — Progress Notes (Signed)
CLINICAL SOCIAL WORK MATERNAL/CHILD NOTE  Patient Details  Name: Bailey Bailey MRN: [id number] Date of Birth: 08/03/2015  Date:  08/05/2015  Clinical Social Worker Initiating Note:  Lucita Ferrara, LCSW Date/ Time Initiated:  08/04/15/1430     Child's Name:  Bailey Bailey   Legal Guardian:  Vicenta Aly Ruby Cola and Festus Barren (parents)  Need for Interpreter:  None   Date of Referral:  08/03/15     Reason for Referral:  History of anxiety, depression, postpartum depression  Referral Source:  [facility]   Address:  [address] 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 MOB endorsed symptoms of depression during this pregnancy secondary to relational stress with FOB.  MOB is not interested in medication at this time for depression, but verbalized interest in therapy. 2)Family/Relationship Issues: MOB reported ongoing relational stress with FOB.  MOB presents with insight related to impacts of the relationship on herself and her children, but continues to be unsure her plans postpartum related to the relationship.     Cognitive State:  Able to Concentrate , Alert , Linear Thinking , Goal Oriented    Mood/Affect:  Calm , Comfortable , Interested    CSW Assessment:  CSW received request  for consult due to MOB presenting with a history of anxiety, depression, and PPD.  MOB presented as easily engaged and receptive to the visit. CSW met with MOB on two separate occassions, one time on 8/11 and for a follow up visit on 8/12.  She displayed a full range in affect and was noted to be in a pleasant mood.  MOB openly discussed numerous psychosocial stressors that contribute to her mental health history. She confirmed history of depression and anxiety during the pregnancy secondary to financial stressors and relational stress with FOB.  CSW continued to explore the source of the stress, including the impact of his behaviors on her and her 24 year old.  She presented with awareness that she cannot change him and have him become more supportive and awareness, and that he has had a negative impact on her mental health.  MOB demonstrated ability to look forward and identify potential negative outcomes if he continues to be a part of her life. She shared that she knows that she should end the relationship, but it continues to be difficult for her to do so because she wants to believe that he can change, and she is concerned about how other people may view her if she is a single mother of two with two different fathers.  CSW continued to provide support as the MOB reflected upon positive and negative aspects of staying with the FOB and ending the relationship.  CSW assisted the MOB to identify an ideal living situation in 3-4 months, and potential steps that she will need to take  in order to reach her goals.    Per MOB, she experienced symptoms of depression during the pregnancy as she frequently cried, had limited motivation to get out of bed, and noted increase in lability.  She stated that she sometimes felt hopeless, but felt better when she thought about her children.  She discussed perceptions that she sometimes feels as though she has minimal support from her family which can also contribute to  feelings of isolation and being alone.  MOB expressed interest in referral for therapy since she would have an opportunity to have professional support as she continues to process her thoughts and feelings.  MOB declined interest for medication at this time to address symptoms of depression. She shared that she does not want to be seen as "crazy", and she remained firm in this thought during the visit  MOB acknowledged that there are medications available to her in the event that symptoms become acute and she is unable to effectively cope.  MOB recognizes her increased risk for developing postpartum depression due to her history of PPD (never received treatment), symptoms during the pregnancy, and increased stress.  MOB agreed to notify her medical provider if she notes symptoms.    MOB reported feelings of appreciation and gratitude numerous times during the two visit for CSW support. She shared that it continues to be helpful for her to have someone to process her feelings with.  CSW explored with MOB additional emotional regulation skills that can assist her.  MOB verbalized the positive impact her children have on her, and discussed how they help her to focus on the present moment instead of worrying about the past and the future.   CSW Plan/Description:   1)Patient/Family Education: Perinatal mood and anxiety disorders 2)Information/Referral to [employer]: Mental health providers, perinatal mood and anxiety disorders 3)No Further Intervention Required/No Barriers to Discharge    Sharyl Nimrod 08/05/2015, 11:37 AM

## 2015-08-06 MED ORDER — IBUPROFEN 800 MG PO TABS: 800.0000 mg | ORAL_TABLET | Freq: Three times a day (TID) | ORAL | Status: DC | PRN

## 2015-08-06 MED ORDER — PRENATAL MULTIVITAMIN CH: 1.0000 | ORAL_TABLET | Freq: Every day | ORAL | Status: DC

## 2015-08-06 MED ORDER — OXYCODONE-ACETAMINOPHEN 5-325 MG PO TABS: 1.0000 | ORAL_TABLET | ORAL | Status: DC | PRN

## 2015-08-06 NOTE — Discharge Summary (Signed)
Obstetric Discharge Summary Reason for Admission: cesarean section Prenatal Procedures: ultrasound Intrapartum Procedures: cesarean: low cervical, transverse Postpartum Procedures: none Complications-Operative and Postpartum: none HEMOGLOBIN  Date Value Ref Range Status  08/04/2015 9.1* 12.0 - 15.0 g/dL Final   HCT  Date Value Ref Range Status  08/04/2015 28.0* 36.0 - 46.0 % Final    Physical Exam:  General: alert and cooperative Lochia: appropriate Uterine Fundus: firm Incision: no significant drainage DVT Evaluation: No evidence of DVT seen on physical exam.  Discharge Diagnoses: Term Pregnancy-delivered  Discharge Information: Date: 08/06/2015 Activity: pelvic rest Diet: routine Medications: PNV, Ibuprofen and Percocet Condition: stable Instructions: refer to practice specific booklet Discharge to: home Follow-up Information    Schedule an appointment as soon as possible for a visit in 2 weeks to follow up.      Newborn Data: Live born female  Birth Weight: 6 lb 1.7 oz (2770 g) APGAR: 8, 8  Home with mother.  Jolynne Spurgin 08/06/2015, 8:11 AM

## 2015-08-06 NOTE — Lactation Note (Signed)
This note was copied from the chart of Bailey Bailey. Lactation Consultation Note  Patient Name: Bailey Bailey [id number] Date: 08/06/2015 Reason for consult: Follow-up assessment   With this mom of a term baby, small, weighing 5 lbs 11.4 oz today. Mom was having trouble getting a deep latch, and has a sucking stripe on her left nipple. I had mom sit back, support herself and baby in cross cradle hold, and to bring the baby to her. The baby latches vigorously, with strong suckles and swallows. Mom reports this latch feeling comfortable, saying "Now I can relax"  I reviewed lactation services with mom, and encouraged her to call for questions/concerns   Maternal Data    Feeding Feeding Type: Breast Fed Length of feed: 15 min  LATCH Score/Interventions Latch: Grasps breast easily, tongue down, lips flanged, rhythmical sucking.  Audible Swallowing: Spontaneous and intermittent  Type of Nipple: Everted at rest and after stimulation  Comfort (Breast/Nipple): Filling, red/small blisters or bruises, mild/mod discomfort  Problem noted: Cracked, bleeding, blisters, bruises;Mild/Moderate discomfort (stripe on left nipple EBM, coconut oil advised) Interventions  (Cracked/bleeding/bruising/blister): Expressed breast milk to nipple Interventions (Mild/moderate discomfort): Post-pump  Hold (Positioning): Assistance needed to correctly position infant at breast and maintain latch. Intervention(s): Breastfeeding basics reviewed;Support Pillows;Position options;Skin to skin  LATCH Score: 8  Lactation Tools Discussed/Used     Consult Status Consult Status: Complete Follow-up type: Call as needed    Alfred Levins 08/06/2015, 11:32 AM

## 2015-08-10 ENCOUNTER — Emergency Department (HOSPITAL_COMMUNITY)
Admission: EM | Admit: 2015-08-10 | Discharge: 2015-08-10 | Discharge status: Absent without leave | Payer: Medicaid Other

## 2015-08-10 ENCOUNTER — Encounter (HOSPITAL_COMMUNITY): Payer: Self-pay | Admitting: *Deleted

## 2015-08-10 ENCOUNTER — Inpatient Hospital Stay (HOSPITAL_COMMUNITY)
Admission: AD | Admit: 2015-08-10 | Discharge: 2015-08-10 | Disposition: A | Discharge status: Home / Self Care | Payer: Medicaid Other | Source: Ambulatory Visit | Attending: Obstetrics and Gynecology | Admitting: Obstetrics and Gynecology

## 2015-08-10 DIAGNOSIS — O872 Hemorrhoids in the puerperium: Secondary | ICD-10-CM | POA: Diagnosis not present

## 2015-08-10 DIAGNOSIS — K641 Second degree hemorrhoids: Secondary | ICD-10-CM | POA: Diagnosis not present

## 2015-08-10 DIAGNOSIS — R1030 Lower abdominal pain, unspecified: Secondary | ICD-10-CM | POA: Diagnosis present

## 2015-08-10 LAB — URINALYSIS, ROUTINE W REFLEX MICROSCOPIC
Bilirubin Urine: NEGATIVE
Glucose, UA: NEGATIVE mg/dL
Ketones, ur: NEGATIVE mg/dL
NITRITE: NEGATIVE
PH: 6.5 (ref 5.0–8.0)
Protein, ur: NEGATIVE mg/dL
SPECIFIC GRAVITY, URINE: 1.025 (ref 1.005–1.030)
UROBILINOGEN UA: 1 mg/dL (ref 0.0–1.0)

## 2015-08-10 LAB — URINE MICROSCOPIC-ADD ON

## 2015-08-10 MED ORDER — HYDROCORTISONE ACE-PRAMOXINE 1-1 % RE FOAM: 1.0000 | Freq: Three times a day (TID) | RECTAL | Status: DC

## 2015-08-10 NOTE — MAU Note (Signed)
Pt states she thinks she has a hemorrhoid and she is having pain at rectum and pain with urination. S/p c-section on 08/10.

## 2015-08-10 NOTE — Discharge Instructions (Signed)

## 2015-08-10 NOTE — MAU Provider Note (Signed)
History     CSN: [id number]  Arrival date and time: 08/10/15 2112   First Provider Initiated Contact with Patient 08/10/15 2221      No chief complaint on file.  HPI Comments: Bailey Bailey is a 24 y.o. G2P2002 who is s/p c-section on 08/03/15. She states that since then she has been feeling like "something was going to come out of her bottom". Then today something did come out, and she is having 10/10 pain.   Groin Pain This is a new problem. The current episode started today. The problem occurs constantly. The problem has been unchanged. The pain is severe (10/10). She is not pregnant (post c-section on 08/03/15 ). Associated symptoms include constipation (last BM before c-section ). Pertinent negatives include no abdominal pain, diarrhea, dysuria, fever, frequency, nausea, urgency or vomiting. Nothing aggravates the symptoms. She has tried warm baths for the symptoms. The treatment provided mild relief.     Past Medical History  Diagnosis Date  . Asthma     inhaler PRN- rarely uses  . Anemia   . Anxiety     panic attacks  . History of postpartum depression     mild- no meds  . Hx of chlamydia infection 07/2013  . Hx of tooth extraction 2015    Past Surgical History  Procedure Laterality Date  . No past surgeries    . Cesarean section  04/16/2012    Procedure: CESAREAN SECTION;  Surgeon: Meriel Pica, MD;  Location: [facility];  Service: Gynecology;  Laterality: N/A;  . Cesarean section N/A 08/03/2015    Procedure: CESAREAN SECTION;  Surgeon: Richarda Overlie, MD;  Location: [facility];  Service: Obstetrics;  Laterality: N/A;  Repeat edc 08/10/15 nkda    Family History  Problem Relation Age of Onset  . Kidney disease Mother   . Migraines Mother   . Diabetes Father   . Heart disease Maternal Grandmother 50    CHF    Social History  Substance Use Topics  . Smoking status: Never Smoker   . Smokeless tobacco: Never Used  . Alcohol Use: No    Allergies: No Known  Allergies  Prescriptions prior to admission  Medication Sig Dispense Refill Last Dose  . ibuprofen (ADVIL,MOTRIN) 800 MG tablet Take 1 tablet (800 mg total) by mouth every 8 (eight) hours as needed for moderate pain. 30 tablet 0 08/09/2015 at Unknown time  . oxyCODONE-acetaminophen (PERCOCET/ROXICET) 5-325 MG per tablet Take 1 tablet by mouth every 4 (four) hours as needed (for pain scale 4-7). 30 tablet 0 08/09/2015 at Unknown time  . Prenatal Vit-Fe Fumarate-FA (PRENATAL MULTIVITAMIN) TABS tablet Take 1 tablet by mouth daily at 12 noon. 30 tablet 6 08/09/2015 at Unknown time  . albuterol (PROVENTIL HFA;VENTOLIN HFA) 108 (90 BASE) MCG/ACT inhaler Inhale 2 puffs into the lungs every 6 (six) hours as needed for wheezing or shortness of breath.    rescue    Review of Systems  Constitutional: Negative for fever.  Gastrointestinal: Positive for constipation (last BM before c-section ). Negative for nausea, vomiting, abdominal pain and diarrhea.  Genitourinary: Negative for dysuria, urgency and frequency.   Physical Exam   Last menstrual period 10/26/2014, unknown if currently breastfeeding.  Physical Exam  Nursing note and vitals reviewed. Constitutional: She is oriented to person, place, and time. She appears well-developed and well-nourished. No distress.  HENT:  Head: Normocephalic.  Cardiovascular: Normal rate.   Respiratory: Effort normal.  GI: Soft. There is no tenderness. There  is no rebound.  Genitourinary:  externalized hemorrhoid.   Neurological: She is alert and oriented to person, place, and time.  Skin: Skin is warm and dry.  Psychiatric: She has a normal mood and affect.    MAU Course  Procedures  MDM 2310: D/W Dr. Vincente Poli, ok for dc home.   Assessment and Plan   1. Second degree hemorrhoids    DC home Comfort measures reviewed  RX: proctofoam with 2 refills  Return to MAU as needed FU with OB as planned  Follow-up Information    Follow up with Meriel Pica, MD.   Specialty:  Obstetrics and Gynecology   Why:  As scheduled   Contact information:   [address] [place] [address] [phone]         Tawnya Crook 08/10/2015, 10:51 PM
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
## 2019-04-23 ENCOUNTER — Encounter (HOSPITAL_COMMUNITY): Payer: Self-pay | Admitting: Emergency Medicine

## 2019-04-23 ENCOUNTER — Emergency Department (HOSPITAL_COMMUNITY): Payer: No Typology Code available for payment source

## 2019-04-23 ENCOUNTER — Other Ambulatory Visit: Payer: Self-pay

## 2019-04-23 ENCOUNTER — Observation Stay (HOSPITAL_COMMUNITY)
Admission: EM | Admit: 2019-04-23 | Discharge: 2019-04-26 | Disposition: A | Discharge status: Home / Self Care | Payer: No Typology Code available for payment source | Attending: Orthopaedic Surgery | Admitting: Orthopaedic Surgery

## 2019-04-23 DIAGNOSIS — S52611A Displaced fracture of right ulna styloid process, initial encounter for closed fracture: Secondary | ICD-10-CM | POA: Diagnosis not present

## 2019-04-23 DIAGNOSIS — S53104A Unspecified dislocation of right ulnohumeral joint, initial encounter: Secondary | ICD-10-CM | POA: Insufficient documentation

## 2019-04-23 DIAGNOSIS — T148XXA Other injury of unspecified body region, initial encounter: Secondary | ICD-10-CM

## 2019-04-23 DIAGNOSIS — S42401A Unspecified fracture of lower end of right humerus, initial encounter for closed fracture: Secondary | ICD-10-CM | POA: Diagnosis present

## 2019-04-23 DIAGNOSIS — Z7951 Long term (current) use of inhaled steroids: Secondary | ICD-10-CM | POA: Insufficient documentation

## 2019-04-23 DIAGNOSIS — T1490XA Injury, unspecified, initial encounter: Secondary | ICD-10-CM

## 2019-04-23 DIAGNOSIS — R52 Pain, unspecified: Secondary | ICD-10-CM | POA: Diagnosis present

## 2019-04-23 DIAGNOSIS — Z820 Family history of epilepsy and other diseases of the nervous system: Secondary | ICD-10-CM | POA: Diagnosis not present

## 2019-04-23 DIAGNOSIS — S42493A Other displaced fracture of lower end of unspecified humerus, initial encounter for closed fracture: Secondary | ICD-10-CM | POA: Diagnosis present

## 2019-04-23 DIAGNOSIS — Y92410 Unspecified street and highway as the place of occurrence of the external cause: Secondary | ICD-10-CM | POA: Insufficient documentation

## 2019-04-23 DIAGNOSIS — S52601A Unspecified fracture of lower end of right ulna, initial encounter for closed fracture: Secondary | ICD-10-CM

## 2019-04-23 DIAGNOSIS — S42471A Displaced transcondylar fracture of right humerus, initial encounter for closed fracture: Principal | ICD-10-CM | POA: Insufficient documentation

## 2019-04-23 DIAGNOSIS — Z791 Long term (current) use of non-steroidal anti-inflammatories (NSAID): Secondary | ICD-10-CM | POA: Insufficient documentation

## 2019-04-23 DIAGNOSIS — Z833 Family history of diabetes mellitus: Secondary | ICD-10-CM | POA: Diagnosis not present

## 2019-04-23 DIAGNOSIS — Z8249 Family history of ischemic heart disease and other diseases of the circulatory system: Secondary | ICD-10-CM | POA: Insufficient documentation

## 2019-04-23 DIAGNOSIS — Q056 Thoracic spina bifida without hydrocephalus: Secondary | ICD-10-CM | POA: Insufficient documentation

## 2019-04-23 DIAGNOSIS — Z79899 Other long term (current) drug therapy: Secondary | ICD-10-CM | POA: Diagnosis not present

## 2019-04-23 DIAGNOSIS — F410 Panic disorder [episodic paroxysmal anxiety] without agoraphobia: Secondary | ICD-10-CM | POA: Diagnosis not present

## 2019-04-23 DIAGNOSIS — S42453A Displaced fracture of lateral condyle of unspecified humerus, initial encounter for closed fracture: Secondary | ICD-10-CM | POA: Diagnosis present

## 2019-04-23 DIAGNOSIS — D649 Anemia, unspecified: Secondary | ICD-10-CM | POA: Diagnosis not present

## 2019-04-23 DIAGNOSIS — F419 Anxiety disorder, unspecified: Secondary | ICD-10-CM | POA: Diagnosis not present

## 2019-04-23 DIAGNOSIS — Z09 Encounter for follow-up examination after completed treatment for conditions other than malignant neoplasm: Secondary | ICD-10-CM

## 2019-04-23 DIAGNOSIS — J45909 Unspecified asthma, uncomplicated: Secondary | ICD-10-CM | POA: Diagnosis not present

## 2019-04-23 LAB — CBC WITH DIFFERENTIAL/PLATELET
Abs Immature Granulocytes: 0.04 10*3/uL (ref 0.00–0.07)
Basophils Absolute: 0 10*3/uL (ref 0.0–0.1)
Basophils Relative: 0 %
Eosinophils Absolute: 0.1 10*3/uL (ref 0.0–0.5)
Eosinophils Relative: 1 %
HCT: 35.9 % — ABNORMAL LOW (ref 36.0–46.0)
Hemoglobin: 11.4 g/dL — ABNORMAL LOW (ref 12.0–15.0)
Immature Granulocytes: 0 %
Lymphocytes Relative: 12 %
Lymphs Abs: 1.8 10*3/uL (ref 0.7–4.0)
MCH: 28.6 pg (ref 26.0–34.0)
MCHC: 31.8 g/dL (ref 30.0–36.0)
MCV: 90.2 fL (ref 80.0–100.0)
Monocytes Absolute: 0.6 10*3/uL (ref 0.1–1.0)
Monocytes Relative: 4 %
Neutro Abs: 12.2 10*3/uL — ABNORMAL HIGH (ref 1.7–7.7)
Neutrophils Relative %: 83 %
Platelets: 299 10*3/uL (ref 150–400)
RBC: 3.98 MIL/uL (ref 3.87–5.11)
RDW: 12.9 % (ref 11.5–15.5)
WBC: 14.8 10*3/uL — ABNORMAL HIGH (ref 4.0–10.5)
nRBC: 0 % (ref 0.0–0.2)

## 2019-04-23 LAB — COMPREHENSIVE METABOLIC PANEL
ALT: 20 U/L (ref 0–44)
AST: 16 U/L (ref 15–41)
Albumin: 3.7 g/dL (ref 3.5–5.0)
Alkaline Phosphatase: 42 U/L (ref 38–126)
Anion gap: 9 (ref 5–15)
BUN: 9 mg/dL (ref 6–20)
CO2: 25 mmol/L (ref 22–32)
Calcium: 9.1 mg/dL (ref 8.9–10.3)
Chloride: 104 mmol/L (ref 98–111)
Creatinine, Ser: 0.59 mg/dL (ref 0.44–1.00)
GFR calc Af Amer: >60 mL/min (ref >60)
GFR calc non Af Amer: >60 mL/min (ref >60)
Glucose, Bld: 112 mg/dL — ABNORMAL HIGH (ref 70–99)
Potassium: 3.4 mmol/L — ABNORMAL LOW (ref 3.5–5.1)
Sodium: 138 mmol/L (ref 135–145)
Total Bilirubin: 0.6 mg/dL (ref 0.3–1.2)
Total Protein: 7.6 g/dL (ref 6.5–8.1)

## 2019-04-23 LAB — I-STAT BETA HCG BLOOD, ED (MC, WL, AP ONLY): I-stat hCG, quantitative: <5 m[IU]/mL (ref <5)

## 2019-04-23 MED ORDER — SODIUM CHLORIDE 0.9 % IV BOLUS (SEPSIS)
1000.0000 mL | Freq: Once | INTRAVENOUS | Status: AC
  Administered 2019-04-23: 1000 mL via INTRAVENOUS

## 2019-04-23 MED ORDER — IOHEXOL 300 MG/ML  SOLN
100.0000 mL | Freq: Once | INTRAMUSCULAR | Status: AC | PRN
  Administered 2019-04-23: 100 mL via INTRAVENOUS

## 2019-04-23 MED ORDER — FENTANYL CITRATE (PF) 100 MCG/2ML IJ SOLN
25.0000 ug | Freq: Once | INTRAMUSCULAR | Status: AC
  Administered 2019-04-23: 25 ug via INTRAVENOUS
  Filled 2019-04-23: qty 2

## 2019-04-23 MED ORDER — FENTANYL CITRATE (PF) 100 MCG/2ML IJ SOLN
100.0000 ug | Freq: Once | INTRAMUSCULAR | Status: AC
  Administered 2019-04-23: 100 ug via INTRAVENOUS
  Filled 2019-04-23: qty 2

## 2019-04-23 MED ORDER — PROPOFOL 10 MG/ML IV BOLUS
1.0000 mg/kg | Freq: Once | INTRAVENOUS | Status: AC
  Administered 2019-04-23: 77.1 mg via INTRAVENOUS
  Filled 2019-04-23: qty 20

## 2019-04-23 MED ORDER — HYDROMORPHONE HCL 1 MG/ML IJ SOLN
1.0000 mg | Freq: Once | INTRAMUSCULAR | Status: AC
  Administered 2019-04-23: 1 mg via INTRAVENOUS
  Filled 2019-04-23: qty 1

## 2019-04-23 MED ORDER — ONDANSETRON HCL 4 MG/2ML IJ SOLN
4.0000 mg | Freq: Once | INTRAMUSCULAR | Status: AC
  Administered 2019-04-23: 4 mg via INTRAVENOUS
  Filled 2019-04-23: qty 2

## 2019-04-23 NOTE — ED Notes (Signed)
Nurse Navigator communication with patient, she reports her children are over in PEDs. She wants to know if they are ok? At this time the PEDs EDP is not at bedside to provide update. The room number was given to her when she is feeling well enough to call.
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
## 2019-04-23 NOTE — ED Notes (Signed)
Pt transported to xray 

## 2019-04-23 NOTE — ED Notes (Signed)
Pt placed on purewick 

## 2019-04-23 NOTE — ED Notes (Addendum)
Bailey Bailey - Mother - [phone] Please call with updates

## 2019-04-23 NOTE — ED Notes (Signed)
Pt transported to CT ?

## 2019-04-23 NOTE — ED Notes (Signed)
Informed by PEDS tech that pt received number to contact PEDS nurse for info on children. Pt still very anxious.

## 2019-04-24 ENCOUNTER — Encounter (HOSPITAL_COMMUNITY)
Admission: EM | Disposition: A | Discharge status: Home / Self Care | Payer: Self-pay | Source: Home / Self Care | Attending: Emergency Medicine

## 2019-04-24 ENCOUNTER — Observation Stay (HOSPITAL_COMMUNITY): Payer: No Typology Code available for payment source | Admitting: Certified Registered"

## 2019-04-24 ENCOUNTER — Observation Stay (HOSPITAL_COMMUNITY): Payer: No Typology Code available for payment source

## 2019-04-24 ENCOUNTER — Emergency Department (HOSPITAL_COMMUNITY): Payer: No Typology Code available for payment source

## 2019-04-24 DIAGNOSIS — S42453A Displaced fracture of lateral condyle of unspecified humerus, initial encounter for closed fracture: Secondary | ICD-10-CM | POA: Diagnosis present

## 2019-04-24 DIAGNOSIS — S42493A Other displaced fracture of lower end of unspecified humerus, initial encounter for closed fracture: Secondary | ICD-10-CM | POA: Diagnosis present

## 2019-04-24 HISTORY — PX: ORIF HUMERUS FRACTURE: SHX2126

## 2019-04-24 LAB — PREGNANCY, URINE: Preg Test, Ur: NEGATIVE

## 2019-04-24 LAB — HIV ANTIBODY (ROUTINE TESTING W REFLEX): HIV Screen 4th Generation wRfx: NONREACTIVE

## 2019-04-24 LAB — SURGICAL PCR SCREEN
MRSA, PCR: NEGATIVE
Staphylococcus aureus: POSITIVE — AB

## 2019-04-24 SURGERY — OPEN REDUCTION INTERNAL FIXATION (ORIF) DISTAL HUMERUS FRACTURE
Anesthesia: General | Site: Elbow | Laterality: Right

## 2019-04-24 MED ORDER — ROCURONIUM BROMIDE 50 MG/5ML IV SOSY
PREFILLED_SYRINGE | INTRAVENOUS | Status: DC | PRN
  Administered 2019-04-24: 50 mg via INTRAVENOUS

## 2019-04-24 MED ORDER — DIPHENHYDRAMINE HCL 12.5 MG/5ML PO ELIX: 12.5000 mg | ORAL_SOLUTION | ORAL | Status: DC | PRN

## 2019-04-24 MED ORDER — PROPOFOL 10 MG/ML IV BOLUS
INTRAVENOUS | Status: DC | PRN
  Administered 2019-04-24: 50 mg via INTRAVENOUS
  Administered 2019-04-24: 140 mg via INTRAVENOUS

## 2019-04-24 MED ORDER — OXYCODONE HCL 5 MG PO TABS: ORAL_TABLET | ORAL | 0 refills | Status: AC

## 2019-04-24 MED ORDER — PROPOFOL 10 MG/ML IV BOLUS
INTRAVENOUS | Status: AC | PRN
  Administered 2019-04-24 (×2): 40 mg via INTRAVENOUS
  Administered 2019-04-24: 20 mg via INTRAVENOUS

## 2019-04-24 MED ORDER — ONDANSETRON HCL 4 MG PO TABS: 4.0000 mg | ORAL_TABLET | Freq: Four times a day (QID) | ORAL | Status: DC | PRN

## 2019-04-24 MED ORDER — FENTANYL CITRATE (PF) 100 MCG/2ML IJ SOLN
INTRAMUSCULAR | Status: AC
  Administered 2019-04-24: 100 ug via INTRAVENOUS
  Filled 2019-04-24: qty 2

## 2019-04-24 MED ORDER — PHENYLEPHRINE 40 MCG/ML (10ML) SYRINGE FOR IV PUSH (FOR BLOOD PRESSURE SUPPORT)
PREFILLED_SYRINGE | INTRAVENOUS | Status: AC
  Filled 2019-04-24: qty 10

## 2019-04-24 MED ORDER — DEXAMETHASONE SODIUM PHOSPHATE 10 MG/ML IJ SOLN
INTRAMUSCULAR | Status: DC | PRN
  Administered 2019-04-24: 10 mg via INTRAVENOUS

## 2019-04-24 MED ORDER — ESMOLOL HCL 100 MG/10ML IV SOLN
INTRAVENOUS | Status: AC
  Filled 2019-04-24: qty 10

## 2019-04-24 MED ORDER — SENNOSIDES-DOCUSATE SODIUM 8.6-50 MG PO TABS: 1.0000 | ORAL_TABLET | Freq: Every evening | ORAL | Status: DC | PRN

## 2019-04-24 MED ORDER — 0.9 % SODIUM CHLORIDE (POUR BTL) OPTIME
TOPICAL | Status: DC | PRN
  Administered 2019-04-24 (×2): 1000 mL

## 2019-04-24 MED ORDER — LIDOCAINE 2% (20 MG/ML) 5 ML SYRINGE
INTRAMUSCULAR | Status: DC | PRN
  Administered 2019-04-24: 40 mg via INTRAVENOUS

## 2019-04-24 MED ORDER — MELOXICAM 7.5 MG PO TABS: 7.5000 mg | ORAL_TABLET | Freq: Every day | ORAL | 2 refills | Status: DC

## 2019-04-24 MED ORDER — LACTATED RINGERS IV SOLN
INTRAVENOUS | Status: DC
  Administered 2019-04-24: 12:00:00 via INTRAVENOUS

## 2019-04-24 MED ORDER — MIDAZOLAM HCL 2 MG/2ML IJ SOLN
INTRAMUSCULAR | Status: AC
  Administered 2019-04-24: 12:00:00 2 mg via INTRAVENOUS
  Filled 2019-04-24: qty 2

## 2019-04-24 MED ORDER — MUPIROCIN 2 % EX OINT
1.0000 "application " | TOPICAL_OINTMENT | Freq: Two times a day (BID) | CUTANEOUS | Status: DC
  Administered 2019-04-24 – 2019-04-25 (×4): 1 via NASAL
  Filled 2019-04-24 (×2): qty 22

## 2019-04-24 MED ORDER — BUPIVACAINE-EPINEPHRINE (PF) 0.5% -1:200000 IJ SOLN
INTRAMUSCULAR | Status: DC | PRN
  Administered 2019-04-24: 15 mL via PERINEURAL

## 2019-04-24 MED ORDER — METHOCARBAMOL 500 MG PO TABS
500.0000 mg | ORAL_TABLET | Freq: Four times a day (QID) | ORAL | Status: DC | PRN
  Administered 2019-04-25 – 2019-04-26 (×2): 500 mg via ORAL
  Filled 2019-04-24 (×2): qty 1

## 2019-04-24 MED ORDER — DOCUSATE SODIUM 100 MG PO CAPS
100.0000 mg | ORAL_CAPSULE | Freq: Two times a day (BID) | ORAL | Status: DC
  Administered 2019-04-24 – 2019-04-26 (×5): 100 mg via ORAL
  Filled 2019-04-24 (×5): qty 1

## 2019-04-24 MED ORDER — FENTANYL CITRATE (PF) 250 MCG/5ML IJ SOLN
INTRAMUSCULAR | Status: AC
  Filled 2019-04-24: qty 5

## 2019-04-24 MED ORDER — ONDANSETRON HCL 4 MG/2ML IJ SOLN: 4.0000 mg | Freq: Four times a day (QID) | INTRAMUSCULAR | Status: DC | PRN

## 2019-04-24 MED ORDER — CHLORHEXIDINE GLUCONATE CLOTH 2 % EX PADS
6.0000 | MEDICATED_PAD | Freq: Every day | CUTANEOUS | Status: DC
  Administered 2019-04-24 – 2019-04-25 (×2): 6 via TOPICAL

## 2019-04-24 MED ORDER — MIDAZOLAM HCL 2 MG/2ML IJ SOLN
2.0000 mg | Freq: Once | INTRAMUSCULAR | Status: AC
  Administered 2019-04-24: 2 mg via INTRAVENOUS

## 2019-04-24 MED ORDER — HYDROMORPHONE HCL 1 MG/ML IJ SOLN: 0.2500 mg | INTRAMUSCULAR | Status: DC | PRN

## 2019-04-24 MED ORDER — METHOCARBAMOL 1000 MG/10ML IJ SOLN
500.0000 mg | Freq: Four times a day (QID) | INTRAVENOUS | Status: DC | PRN
  Administered 2019-04-24: 500 mg via INTRAVENOUS
  Filled 2019-04-24: qty 500
  Filled 2019-04-24: qty 5

## 2019-04-24 MED ORDER — MIDAZOLAM HCL 5 MG/5ML IJ SOLN
INTRAMUSCULAR | Status: DC | PRN
  Administered 2019-04-24: 2 mg via INTRAVENOUS

## 2019-04-24 MED ORDER — ACETAMINOPHEN 500 MG PO TABS: 1000.0000 mg | ORAL_TABLET | Freq: Three times a day (TID) | ORAL | 0 refills | Status: AC

## 2019-04-24 MED ORDER — LACTATED RINGERS IV SOLN
INTRAVENOUS | Status: DC
  Administered 2019-04-24 (×3): via INTRAVENOUS

## 2019-04-24 MED ORDER — TOBRAMYCIN SULFATE 1.2 G IJ SOLR
INTRAMUSCULAR | Status: AC
  Filled 2019-04-24: qty 1.2

## 2019-04-24 MED ORDER — SUGAMMADEX SODIUM 200 MG/2ML IV SOLN
INTRAVENOUS | Status: DC | PRN
  Administered 2019-04-24: 200 mg via INTRAVENOUS

## 2019-04-24 MED ORDER — PROMETHAZINE HCL 25 MG/ML IJ SOLN
INTRAMUSCULAR | Status: AC
  Filled 2019-04-24: qty 1

## 2019-04-24 MED ORDER — ACETAMINOPHEN 500 MG PO TABS
1000.0000 mg | ORAL_TABLET | Freq: Three times a day (TID) | ORAL | Status: DC
  Administered 2019-04-24 – 2019-04-26 (×6): 1000 mg via ORAL
  Filled 2019-04-24 (×6): qty 2

## 2019-04-24 MED ORDER — CEFAZOLIN SODIUM-DEXTROSE 1-4 GM/50ML-% IV SOLN
1.0000 g | Freq: Three times a day (TID) | INTRAVENOUS | Status: AC
  Administered 2019-04-25 (×2): 1 g via INTRAVENOUS
  Filled 2019-04-24 (×3): qty 50

## 2019-04-24 MED ORDER — VANCOMYCIN HCL 1000 MG IV SOLR
INTRAVENOUS | Status: AC
  Filled 2019-04-24: qty 1000

## 2019-04-24 MED ORDER — PROMETHAZINE HCL 25 MG/ML IJ SOLN
12.5000 mg | Freq: Four times a day (QID) | INTRAMUSCULAR | Status: DC | PRN
  Administered 2019-04-24: 12.5 mg via INTRAVENOUS

## 2019-04-24 MED ORDER — BUPIVACAINE LIPOSOME 1.3 % IJ SUSP
INTRAMUSCULAR | Status: DC | PRN
  Administered 2019-04-24: 10 mL via PERINEURAL

## 2019-04-24 MED ORDER — KETAMINE HCL 50 MG/5ML IJ SOSY
0.5000 mg/kg | PREFILLED_SYRINGE | Freq: Once | INTRAMUSCULAR | Status: DC
  Filled 2019-04-24: qty 5

## 2019-04-24 MED ORDER — SUCCINYLCHOLINE CHLORIDE 200 MG/10ML IV SOSY
PREFILLED_SYRINGE | INTRAVENOUS | Status: AC
  Filled 2019-04-24: qty 10

## 2019-04-24 MED ORDER — CHLORHEXIDINE GLUCONATE 4 % EX LIQD
60.0000 mL | Freq: Once | CUTANEOUS | Status: AC
  Administered 2019-04-24: 4 via TOPICAL
  Filled 2019-04-24: qty 15
  Filled 2019-04-24: qty 60

## 2019-04-24 MED ORDER — FENTANYL CITRATE (PF) 100 MCG/2ML IJ SOLN
INTRAMUSCULAR | Status: DC | PRN
  Administered 2019-04-24: 25 ug via INTRAVENOUS
  Administered 2019-04-24: 50 ug via INTRAVENOUS

## 2019-04-24 MED ORDER — OXYCODONE HCL 5 MG PO TABS
5.0000 mg | ORAL_TABLET | ORAL | Status: DC | PRN
  Administered 2019-04-25 (×2): 10 mg via ORAL
  Administered 2019-04-25: 5 mg via ORAL
  Administered 2019-04-26 (×3): 10 mg via ORAL
  Filled 2019-04-24: qty 2
  Filled 2019-04-24: qty 1
  Filled 2019-04-24: qty 2
  Filled 2019-04-24: qty 1
  Filled 2019-04-24 (×3): qty 2

## 2019-04-24 MED ORDER — MIDAZOLAM HCL 2 MG/2ML IJ SOLN
INTRAMUSCULAR | Status: AC
  Filled 2019-04-24: qty 2

## 2019-04-24 MED ORDER — FENTANYL CITRATE (PF) 100 MCG/2ML IJ SOLN
100.0000 ug | Freq: Once | INTRAMUSCULAR | Status: AC
  Administered 2019-04-24: 12:00:00 100 ug via INTRAVENOUS

## 2019-04-24 MED ORDER — ROCURONIUM BROMIDE 10 MG/ML (PF) SYRINGE
PREFILLED_SYRINGE | INTRAVENOUS | Status: AC
  Filled 2019-04-24: qty 10

## 2019-04-24 MED ORDER — CEFAZOLIN SODIUM-DEXTROSE 2-4 GM/100ML-% IV SOLN
2.0000 g | INTRAVENOUS | Status: AC
  Administered 2019-04-24 (×2): 2 g via INTRAVENOUS
  Filled 2019-04-24 (×2): qty 100

## 2019-04-24 MED ORDER — PROPOFOL 10 MG/ML IV BOLUS
0.5000 mg/kg | Freq: Once | INTRAVENOUS | Status: DC
  Filled 2019-04-24: qty 20

## 2019-04-24 MED ORDER — ONDANSETRON HCL 4 MG/2ML IJ SOLN
INTRAMUSCULAR | Status: AC
  Filled 2019-04-24: qty 2

## 2019-04-24 MED ORDER — LIDOCAINE 2% (20 MG/ML) 5 ML SYRINGE
INTRAMUSCULAR | Status: AC
  Filled 2019-04-24: qty 5

## 2019-04-24 MED ORDER — ENSURE PRE-SURGERY PO LIQD
296.0000 mL | Freq: Once | ORAL | Status: AC
  Administered 2019-04-24: 296 mL via ORAL
  Filled 2019-04-24: qty 296

## 2019-04-24 MED ORDER — ONDANSETRON HCL 4 MG PO TABS: 4.0000 mg | ORAL_TABLET | Freq: Three times a day (TID) | ORAL | 1 refills | Status: AC | PRN

## 2019-04-24 MED ORDER — HYDROMORPHONE HCL 1 MG/ML IJ SOLN
0.5000 mg | INTRAMUSCULAR | Status: DC | PRN
  Administered 2019-04-24: 0.5 mg via INTRAVENOUS
  Administered 2019-04-25 – 2019-04-26 (×4): 1 mg via INTRAVENOUS
  Filled 2019-04-24 (×5): qty 1

## 2019-04-24 MED ORDER — ONDANSETRON HCL 4 MG/2ML IJ SOLN
INTRAMUSCULAR | Status: DC | PRN
  Administered 2019-04-24: 4 mg via INTRAVENOUS

## 2019-04-24 MED ORDER — PROPOFOL 10 MG/ML IV BOLUS
INTRAVENOUS | Status: AC
  Filled 2019-04-24: qty 20

## 2019-04-24 MED ORDER — TOBRAMYCIN SULFATE 1.2 G IJ SOLR
INTRAMUSCULAR | Status: DC | PRN
  Administered 2019-04-24: 1.2 g

## 2019-04-24 MED ORDER — CEFAZOLIN SODIUM 1 G IJ SOLR
INTRAMUSCULAR | Status: AC
  Filled 2019-04-24: qty 20

## 2019-04-24 MED ORDER — SUCCINYLCHOLINE CHLORIDE 20 MG/ML IJ SOLN
INTRAMUSCULAR | Status: DC | PRN
  Administered 2019-04-24: 100 mg via INTRAVENOUS

## 2019-04-24 MED ORDER — DEXAMETHASONE SODIUM PHOSPHATE 10 MG/ML IJ SOLN
INTRAMUSCULAR | Status: AC
  Filled 2019-04-24: qty 1

## 2019-04-24 MED ORDER — VANCOMYCIN HCL 1000 MG IV SOLR
INTRAVENOUS | Status: DC | PRN
  Administered 2019-04-24: 1000 mg

## 2019-04-24 MED ORDER — HYDROMORPHONE HCL 1 MG/ML IJ SOLN
1.0000 mg | Freq: Once | INTRAMUSCULAR | Status: AC
  Administered 2019-04-24: 1 mg via INTRAVENOUS
  Filled 2019-04-24: qty 1

## 2019-04-24 SURGICAL SUPPLY — 91 items
AID PSTN UNV HD RSTRNT DISP (MISCELLANEOUS) ×1
BANDAGE ACE 4X5 VEL STRL LF (GAUZE/BANDAGES/DRESSINGS) ×3 IMPLANT
BIT DRILL 2.0 (BIT) ×2
BIT DRILL 2.0MM (BIT) ×1
BIT DRILL 2.5X2.75 QC CALB (BIT) ×3 IMPLANT
BIT DRILL 2XNS DISP SS SM FRAG (BIT) IMPLANT
BIT DRILL 4.8X200 CANN (BIT) ×3 IMPLANT
BIT DRILL CALIBRATED 2.7 (BIT) ×2 IMPLANT
BIT DRILL CALIBRATED 2.7MM (BIT) ×1
BIT DRL 2XNS DISP SS SM FRAG (BIT) ×1
BLADE LONG MED 31MMX9MM (MISCELLANEOUS) ×1
BLADE LONG MED 31X9 (MISCELLANEOUS) ×1 IMPLANT
CHLORAPREP W/TINT 26ML (MISCELLANEOUS) ×3 IMPLANT
CLOSURE STERI-STRIP 1/2X4 (GAUZE/BANDAGES/DRESSINGS) ×2
CLOSURE WOUND 1/2 X4 (GAUZE/BANDAGES/DRESSINGS) ×1
CLSR STERI-STRIP ANTIMIC 1/2X4 (GAUZE/BANDAGES/DRESSINGS) ×4 IMPLANT
COVER SURGICAL LIGHT HANDLE (MISCELLANEOUS) ×6 IMPLANT
COVER WAND RF STERILE (DRAPES) ×3 IMPLANT
DRAIN PENROSE 1/4X12 LTX STRL (WOUND CARE) ×2 IMPLANT
DRAPE C-ARM 42X72 X-RAY (DRAPES) ×3 IMPLANT
DRAPE C-ARMOR (DRAPES) ×2 IMPLANT
DRAPE HALF SHEET 40X57 (DRAPES) ×3 IMPLANT
DRAPE IMP U-DRAPE 54X76 (DRAPES) ×3 IMPLANT
DRAPE INCISE IOBAN 66X45 STRL (DRAPES) ×3 IMPLANT
DRAPE ORTHO SPLIT 77X108 STRL (DRAPES) ×6
DRAPE SURG 17X23 STRL (DRAPES) ×3 IMPLANT
DRAPE SURG ORHT 6 SPLT 77X108 (DRAPES) ×2 IMPLANT
DRAPE U-SHAPE 47X51 STRL (DRAPES) ×3 IMPLANT
DRSG AQUACEL AG ADV 3.5X10 (GAUZE/BANDAGES/DRESSINGS) ×2 IMPLANT
DRSG MEPILEX BORDER 4X8 (GAUZE/BANDAGES/DRESSINGS) IMPLANT
ELECT BLADE 4.0 EZ CLEAN MEGAD (MISCELLANEOUS)
ELECT REM PT RETURN 9FT ADLT (ELECTROSURGICAL) ×3
ELECTRODE BLDE 4.0 EZ CLN MEGD (MISCELLANEOUS) IMPLANT
ELECTRODE REM PT RTRN 9FT ADLT (ELECTROSURGICAL) ×1 IMPLANT
GAUZE SPONGE 4X4 12PLY STRL (GAUZE/BANDAGES/DRESSINGS) IMPLANT
GLOVE BIOGEL PI IND STRL 8 (GLOVE) ×1 IMPLANT
GLOVE BIOGEL PI INDICATOR 8 (GLOVE) ×2
GLOVE ECLIPSE 8.0 STRL XLNG CF (GLOVE) ×3 IMPLANT
GOWN STRL REUS W/ TWL LRG LVL3 (GOWN DISPOSABLE) ×3 IMPLANT
GOWN STRL REUS W/ TWL XL LVL3 (GOWN DISPOSABLE) ×1 IMPLANT
GOWN STRL REUS W/TWL LRG LVL3 (GOWN DISPOSABLE) ×9
GOWN STRL REUS W/TWL XL LVL3 (GOWN DISPOSABLE) ×3
KIT BASIN OR (CUSTOM PROCEDURE TRAY) ×3 IMPLANT
KIT ORTHOSORB 1-PIN  2.0X40 (Joint) ×2 IMPLANT
KIT ORTHOSORB 1-PIN 2.0X40 (Joint) IMPLANT
KIT TURNOVER KIT B (KITS) ×3 IMPLANT
MANIFOLD NEPTUNE II (INSTRUMENTS) ×3 IMPLANT
NDL SUT .5 MAYO 1.404X.05X (NEEDLE) ×1 IMPLANT
NDL SUT 2 .5 CRC MAYO 1.732X (NEEDLE) ×1 IMPLANT
NEEDLE 22X1 1/2 (OR ONLY) (NEEDLE) IMPLANT
NEEDLE MAYO TAPER (NEEDLE) ×6
NS IRRIG 1000ML POUR BTL (IV SOLUTION) ×3 IMPLANT
PACK SHOULDER (CUSTOM PROCEDURE TRAY) ×3 IMPLANT
PAD ARMBOARD 7.5X6 YLW CONV (MISCELLANEOUS) ×6 IMPLANT
PIN GUIDE DRILL TIP 2.8X300 (DRILL) ×2 IMPLANT
PLATE LOCK RT SM (Plate) ×3 IMPLANT
PLATE LOCK RT SM 74X10.7X3.5X9 (Plate) IMPLANT
RESTRAINT HEAD UNIVERSAL NS (MISCELLANEOUS) ×3 IMPLANT
SCREW CANN 90X16X6.5 NS (Screw) ×1 IMPLANT
SCREW CANNULATED 6.5X115MM (Screw) ×2 IMPLANT
SCREW CANNULATED 6.5X90MM (Screw) ×3 IMPLANT
SCREW CORTICAL 2.7MM  48MM (Screw) ×2 IMPLANT
SCREW CORTICAL 2.7MM 48MM (Screw) IMPLANT
SCREW CORTICAL LOW PROF 3.5X20 (Screw) ×2 IMPLANT
SCREW LOCK 3.5X12 DIST TIB (Screw) ×3 IMPLANT
SCREW LOCK 3.5X14 DIST TIB (Screw) ×3 IMPLANT
SCREW LOCK 3.5X40 DIST TIB (Screw) ×2 IMPLANT
SCREW LOCK CORT STAR 3.5X12 (Screw) ×2 IMPLANT
SCREW LOW PROFILE 22MMX3.5MM (Screw) ×2 IMPLANT
SCREW NON LOCKING LP 3.5 16MM (Screw) ×2 IMPLANT
SLING ARM FOAM STRAP LRG (SOFTGOODS) ×3 IMPLANT
SPONGE LAP 18X18 RF (DISPOSABLE) ×6 IMPLANT
STAPLER VISISTAT 35W (STAPLE) ×3 IMPLANT
STRIP CLOSURE SKIN 1/2X4 (GAUZE/BANDAGES/DRESSINGS) ×2 IMPLANT
SUCTION FRAZIER HANDLE 10FR (MISCELLANEOUS) ×2
SUCTION TUBE FRAZIER 10FR DISP (MISCELLANEOUS) ×1 IMPLANT
SUT VIC AB 0 CT1 27 (SUTURE) ×6
SUT VIC AB 0 CT1 27XBRD ANBCTR (SUTURE) ×2 IMPLANT
SUT VIC AB 2-0 CT1 27 (SUTURE) ×6
SUT VIC AB 2-0 CT1 TAPERPNT 27 (SUTURE) ×2 IMPLANT
SUT VIC AB 3-0 CT1 27 (SUTURE) ×6
SUT VIC AB 3-0 CT1 TAPERPNT 27 (SUTURE) ×2 IMPLANT
SYR CONTROL 10ML LL (SYRINGE) IMPLANT
TOWEL OR 17X24 6PK STRL BLUE (TOWEL DISPOSABLE) ×3 IMPLANT
TOWEL OR 17X26 10 PK STRL BLUE (TOWEL DISPOSABLE) ×3 IMPLANT
TUBE CONNECTING 12'X1/4 (SUCTIONS) ×1
TUBE CONNECTING 12X1/4 (SUCTIONS) ×2 IMPLANT
WASHER 3.5MM (Orthopedic Implant) ×2 IMPLANT
WASHER FLAT 6.5MM (Washer) ×3 IMPLANT
WATER STERILE IRR 1000ML POUR (IV SOLUTION) ×3 IMPLANT
YANKAUER SUCT BULB TIP NO VENT (SUCTIONS) ×3 IMPLANT

## 2019-04-24 NOTE — Transfer of Care (Signed)
Immediate Anesthesia Transfer of Care Note  Patient: Bailey Bailey  Procedure(s) Performed: OPEN REDUCTION INTERNAL FIXATION (ORIF) DISTAL HUMERUS FRACTURE (Right Elbow)  Patient Location: PACU  Anesthesia Type:GA combined with regional for post-op pain  Level of Consciousness: drowsy  Airway & Oxygen Therapy: Patient Spontanous Breathing and Patient connected to nasal cannula oxygen  Post-op Assessment: Report given to RN, Post -op Vital signs reviewed and stable and Patient moving all extremities  Post vital signs: Reviewed and stable  Last Vitals:  Vitals Value Taken Time  BP 125/73 04/24/2019  6:10 PM  Temp    Pulse 108 04/24/2019  6:20 PM  Resp 21 04/24/2019  6:20 PM  SpO2 96 % 04/24/2019  6:20 PM  Vitals shown include unvalidated device data.  Last Pain:  Vitals:   04/24/19 1220  TempSrc:   PainSc: 2          Complications: No apparent anesthesia complications

## 2019-04-24 NOTE — Anesthesia Procedure Notes (Signed)
Anesthesia Regional Block: Interscalene brachial plexus block   Pre-Anesthetic Checklist: ,, timeout performed, Correct Patient, Correct Site, Correct Laterality, Correct Procedure, Correct Position, site marked, Risks and benefits discussed, pre-op evaluation,  At surgeon's request and post-op pain management  Laterality: Right  Prep: Maximum Sterile Barrier Precautions used, chloraprep       Needles:  Injection technique: Single-shot  Needle Type: Echogenic Stimulator Needle     Needle Length: 5cm  Needle Gauge: 22     Additional Needles:   Procedures:,,,, ultrasound used (permanent image in chart),,,,  Narrative:  Start time: 04/24/2019 12:05 PM End time: 04/24/2019 12:15 PM Injection made incrementally with aspirations every 5 mL. Anesthesiologist: Gaynelle Adu, MD  Additional Notes: 2% Lidocaine skin wheel.

## 2019-04-24 NOTE — Anesthesia Postprocedure Evaluation (Signed)
Anesthesia Post Note  Patient: Bailey Bailey  Procedure(s) Performed: OPEN REDUCTION INTERNAL FIXATION (ORIF) DISTAL HUMERUS FRACTURE (Right Elbow)     Patient location during evaluation: PACU Anesthesia Type: General Level of consciousness: awake and alert Pain management: pain level controlled Vital Signs Assessment: post-procedure vital signs reviewed and stable Respiratory status: spontaneous breathing, nonlabored ventilation, respiratory function stable and patient connected to nasal cannula oxygen Cardiovascular status: blood pressure returned to baseline and stable Anesthetic complications: no Comments: Nausea improving prior to discharge.     Last Vitals:  Vitals:   04/24/19 2010 04/24/19 2050  BP: 108/67 116/70  Pulse: 86 79  Resp: 16 16  Temp:  37.4 C  SpO2: 99% 100%    Last Pain:  Vitals:   04/24/19 2050  TempSrc: Oral  PainSc:                  Shelton Silvas

## 2019-04-24 NOTE — ED Notes (Signed)
Report given to 6 N RN. All questions answered 

## 2019-04-24 NOTE — Progress Notes (Signed)
Ortho Trauma Progress Note  Will be providing surgical assist to Dr. Everardo Pacific. Risks and benefits discussed with the patient and she agrees to proceed with surgery. Consent signed and patient marked.  Roby Lofts, MD [facility] [phone] (phone) [phone] (office) [email or web address]

## 2019-04-24 NOTE — Plan of Care (Signed)
  Problem: Education: Goal: Knowledge of General Education information will improve Description Including pain rating scale, medication(s)/side effects and non-pharmacologic comfort measures Outcome: Progressing   Problem: Clinical Measurements: Goal: Cardiovascular complication will be avoided Outcome: Progressing   Problem: Activity: Goal: Risk for activity intolerance will decrease Outcome: Progressing   Problem: Nutrition: Goal: Adequate nutrition will be maintained Outcome: Progressing   Problem: Coping: Goal: Level of anxiety will decrease Outcome: Progressing   Problem: Elimination: Goal: Will not experience complications related to bowel motility Outcome: Progressing Goal: Will not experience complications related to urinary retention Outcome: Progressing   Problem: Pain Managment: Goal: General experience of comfort will improve Outcome: Progressing   Problem: Safety: Goal: Ability to remain free from injury will improve Outcome: Progressing   Problem: Skin Integrity: Goal: Risk for impaired skin integrity will decrease Outcome: Progressing   
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
## 2019-04-24 NOTE — Progress Notes (Signed)
Patient drank about 200 mL of clear ensure but vomitted around 50 mL of green liquid.  Refused anti-nausea medication at this time and informed this RN she is "ok" now.

## 2019-04-24 NOTE — Anesthesia Preprocedure Evaluation (Addendum)
Anesthesia Evaluation  Patient identified by MRN, date of birth, ID band Patient awake    Reviewed: Allergy & Precautions, H&P , NPO status , Patient's Chart, lab work & pertinent test results  Airway Mallampati: II  TM Distance: >3 FB Neck ROM: Full    Dental no notable dental hx. (+) Teeth Intact, Dental Advisory Given   Pulmonary asthma ,    Pulmonary exam normal breath sounds clear to auscultation       Cardiovascular negative cardio ROS   Rhythm:Regular Rate:Normal     Neuro/Psych Anxiety negative neurological ROS     GI/Hepatic negative GI ROS, Neg liver ROS,   Endo/Other  negative endocrine ROS  Renal/GU negative Renal ROS  negative genitourinary   Musculoskeletal   Abdominal   Peds  Hematology negative hematology ROS (+)   Anesthesia Other Findings   Reproductive/Obstetrics negative OB ROS                            Anesthesia Physical Anesthesia Plan  ASA: II  Anesthesia Plan: General   Post-op Pain Management:  Regional for Post-op pain   Induction: Intravenous  PONV Risk Score and Plan: 3 and Ondansetron, Dexamethasone and Midazolam  Airway Management Planned: Oral ETT  Additional Equipment:   Intra-op Plan:   Post-operative Plan: Extubation in OR  Informed Consent: I have reviewed the patients History and Physical, chart, labs and discussed the procedure including the risks, benefits and alternatives for the proposed anesthesia with the patient or authorized representative who has indicated his/her understanding and acceptance.     Dental advisory given  Plan Discussed with: CRNA  Anesthesia Plan Comments:         Anesthesia Quick Evaluation

## 2019-04-24 NOTE — Anesthesia Procedure Notes (Signed)
Procedure Name: Intubation Date/Time: 04/24/2019 12:39 PM Performed by: Rosiland Oz, CRNA Pre-anesthesia Checklist: Patient identified, Emergency Drugs available, Suction available, Patient being monitored and Timeout performed Patient Re-evaluated:Patient Re-evaluated prior to induction Oxygen Delivery Method: Circle system utilized Preoxygenation: Pre-oxygenation with 100% oxygen Induction Type: IV induction and Rapid sequence Laryngoscope Size: Miller and 3 Grade View: Grade I Tube type: Oral Tube size: 7.0 mm Number of attempts: 1 Airway Equipment and Method: Stylet Placement Confirmation: ETT inserted through vocal cords under direct vision,  positive ETCO2 and breath sounds checked- equal and bilateral Secured at: 21 cm Tube secured with: Tape Dental Injury: Teeth and Oropharynx as per pre-operative assessment

## 2019-04-25 ENCOUNTER — Observation Stay (HOSPITAL_COMMUNITY): Payer: No Typology Code available for payment source

## 2019-04-25 NOTE — Progress Notes (Addendum)
Patient ID: Wayna Chalet, female   DOB: January 02, 1991, 28 y.o.   MRN: [id number]     Subjective:  Patient reports pain as mild to moderate.  Patient in the bed and in no acute distress.  Denies any CP or SOB  Objective:   VITALS:   Vitals:   04/24/19 2050 04/25/19 0255 04/25/19 0449 04/25/19 0937  BP: 116/70 109/71 (!) 104/58 104/60  Pulse: 79 78 72 91  Resp: 16 18 18    Temp: 99.3 F (37.4 C) 99.4 F (37.4 C) (!) 97.4 F (36.3 C) 99 F (37.2 C)  TempSrc: Oral Oral Oral Oral  SpO2: 100% 100% 100% 99%  Weight:      Height:        ABD soft Sensation intact distally Dorsiflexion/Plantar flexion intact Incision: dressing C/D/I and no drainage  Sensation is decreased but present in radial and ulnar distribution. Motor is very weak and patient can not make a fist.  She can lightly wiggle fingers Long arm splint in place  Lab Results  Component Value Date   WBC 14.8 (H) 04/23/2019   HGB 11.4 (L) 04/23/2019   HCT 35.9 (L) 04/23/2019   MCV 90.2 04/23/2019   PLT 299 04/23/2019   BMET    Component Value Date/Time   NA 138 04/23/2019 2151   K 3.4 (L) 04/23/2019 2151   CL 104 04/23/2019 2151   CO2 25 04/23/2019 2151   GLUCOSE 112 (H) 04/23/2019 2151   BUN 9 04/23/2019 2151   CREATININE 0.59 04/23/2019 2151   CALCIUM 9.1 04/23/2019 2151   GFRNONAA >60 04/23/2019 2151   GFRAA >60 04/23/2019 2151     Assessment/Plan: 1 Day Post-Op   Active Problems:   Closed bicondylar fracture of distal humerus   Advance diet Up with therapy Long arm splint at all times NWB right upper ext DC home and follow up with Dr Everardo Pacific in 2 weeks   Nelda Severe 04/25/2019, 10:44 AM  Discussed and agree with above.   Teryl Lucy, MD Cell [phone]

## 2019-04-25 NOTE — Discharge Summary (Signed)
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 INFORMATION  Your arm will be in a sling following surgery. You will be in this sling for the next 4-6 weeks.  I will let you know the exact duration at your follow-up visit. You may walk and ambulate without restrictions.  You should wean off your narcotic medicines as soon as you are able.  Most patients will be off or using minimal narcotics before their first postop appointment.    You may be more comfortable sleeping in a semi-seated position the first few nights following surgery.  Keep a pillow propped under the elbow and forearm for comfort.  If you have a recliner type of chair it might be beneficial.    We suggest you use the pain medication the first night prior to going to bed, in order to ease any pain when the anesthesia wears off. You should avoid taking pain medications on an empty stomach as it will make you nauseous.  Do not drink alcoholic beverages or take illicit drugs when taking pain medications.  In most states it is against the law to drive while your arm is in a sling. And certainly against the law to drive while taking narcotics.  You may return to work/school in the next couple of days when you feel up to it. Desk work and typing in the sling  is fine.  When dressing, put your operative arm in the sleeve first.  When getting undressed, take your operative arm out last.  Loose fitting, button-down shirts are recommended.  Often in the first days after surgery you may be more comfortable keeping your operative arm under your shirt and not through the sleeve.  Pain medication may make you constipated.  Below are a few solutions to try in this order: Decrease the amount of pain medication if you aren't having pain. Drink lots of decaffeinated fluids. Drink prune juice and/or each dried prunes  If the first 3 don't work start with additional solutions Take Colace - an over-the-counter stool softener Take Senokot - an over-the-counter laxative Take Miralax - a stronger over-the-counter laxative   Increase activity slowly   Complete by:  As directed       Follow-up Information    Bjorn Pippin, MD. Schedule an appointment as soon as possible for a visit in 1 week.   Specialty:  Orthopedic Surgery Contact information: [address] [place] [address] [phone]            Signed: Eulas Post 04/25/2019, 4:40 PM

## 2019-04-25 NOTE — Plan of Care (Addendum)
  Problem: Pain Managment: Goal: General experience of comfort will improve Outcome: Progressing   Problem: Elimination: Goal: Will not experience complications related to bowel motility Outcome: Progressing   Spoke with pt.'s Mom( Tiffany) after pt.'s arrival to floor from PACU.  Updates given concerning Patients surgical progress and comfort level. RUE surgical site with dsg C/D/I, and maintained in post operative sling for support. Pt has no movement in the RUE, but states she feels tingling. Per PACU nurse, pt had a Block. SCD's on, IVF's infusing without difficulty. Call light and phone in reach. Bed exit alarm maintained.

## 2019-04-26 NOTE — Plan of Care (Signed)
  Problem: Pain Managment: Goal: General experience of comfort will improve Outcome: Progressing   Problem: Safety: Goal: Ability to remain free from injury will improve Outcome: Progressing   Problem: Skin Integrity: Goal: Risk for impaired skin integrity will decrease Outcome: Progressing   

## 2019-04-27 ENCOUNTER — Encounter (HOSPITAL_COMMUNITY): Payer: Self-pay | Admitting: Orthopaedic Surgery

## 2019-05-05 ENCOUNTER — Telehealth: Payer: Self-pay

## 2019-05-05 NOTE — Telephone Encounter (Signed)
Patient called on home number, gentleman who answered said wrong number. I called the cell number listed, did not ring, VM box is full.

## 2019-05-27 ENCOUNTER — Ambulatory Visit: Payer: Medicaid Other | Attending: Orthopaedic Surgery | Admitting: Occupational Therapy

## 2019-05-27 DIAGNOSIS — M6281 Muscle weakness (generalized): Secondary | ICD-10-CM | POA: Insufficient documentation

## 2019-05-27 DIAGNOSIS — M25521 Pain in right elbow: Secondary | ICD-10-CM | POA: Insufficient documentation

## 2019-05-27 DIAGNOSIS — M25621 Stiffness of right elbow, not elsewhere classified: Secondary | ICD-10-CM | POA: Insufficient documentation

## 2019-05-27 DIAGNOSIS — M25611 Stiffness of right shoulder, not elsewhere classified: Secondary | ICD-10-CM | POA: Insufficient documentation

## 2019-05-27 DIAGNOSIS — R278 Other lack of coordination: Secondary | ICD-10-CM | POA: Insufficient documentation

## 2019-05-27 DIAGNOSIS — R600 Localized edema: Secondary | ICD-10-CM | POA: Insufficient documentation

## 2019-06-09 ENCOUNTER — Other Ambulatory Visit: Payer: Self-pay

## 2019-06-09 ENCOUNTER — Ambulatory Visit: Payer: Medicaid Other | Admitting: Occupational Therapy

## 2019-06-09 DIAGNOSIS — M6281 Muscle weakness (generalized): Secondary | ICD-10-CM

## 2019-06-09 DIAGNOSIS — R278 Other lack of coordination: Secondary | ICD-10-CM

## 2019-06-09 DIAGNOSIS — R600 Localized edema: Secondary | ICD-10-CM

## 2019-06-09 DIAGNOSIS — M25621 Stiffness of right elbow, not elsewhere classified: Secondary | ICD-10-CM

## 2019-06-09 DIAGNOSIS — M25611 Stiffness of right shoulder, not elsewhere classified: Secondary | ICD-10-CM | POA: Diagnosis present

## 2019-06-09 DIAGNOSIS — M25521 Pain in right elbow: Secondary | ICD-10-CM
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
## 2019-06-09 NOTE — Patient Instructions (Signed)
  1) Wall slides - fold up hand towel and slide towel up and down wall trying to straighten Rt elbow as much as able when going up. Repeat 10 times, 6 sessions per day  2) AROM: Elbow Flexion / Extension    Gently bend Rt elbow as far as possible. Then straighten arm as far as possible. Repeat __10__ times per set.  Do _6___ sessions per day.  Flexion (Passive)    Use other hand to bend elbow, with thumb toward same shoulder. Hold __10__ seconds. Repeat _5___ times. Do _6___ sessions per day.  Extension (Passive)    Place thick telephone book or doubled up pillow on table and rest upper arm on it. Grasp forearm with other hand and use a steady downward  pull to straighten elbow. Hold _10___ seconds. Repeat __5__ times. Do __6__ sessions per day.   1. Grip Strengthening (Resistive Putty)   Squeeze putty using thumb and all fingers. Repeat _20___ times. Do __2__ sessions per day.   2. Roll putty into tube on table and pinch between each finger and thumb x 10 reps each. (Do ring and small finger together). Do 2 sessions per day     Copyright  VHI. All rights reserved.

## 2019-06-16 ENCOUNTER — Ambulatory Visit: Payer: Medicaid Other | Admitting: Occupational Therapy

## 2019-06-16 ENCOUNTER — Other Ambulatory Visit: Payer: Self-pay

## 2019-06-16 DIAGNOSIS — M25521 Pain in right elbow: Secondary | ICD-10-CM

## 2019-06-16 DIAGNOSIS — M6281 Muscle weakness (generalized): Secondary | ICD-10-CM

## 2019-06-16 DIAGNOSIS — M25621 Stiffness of right elbow, not elsewhere classified: Secondary | ICD-10-CM

## 2019-06-16 DIAGNOSIS — M25611 Stiffness of right shoulder, not elsewhere classified: Secondary | ICD-10-CM
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
## 2019-06-23 ENCOUNTER — Ambulatory Visit: Payer: Medicaid Other | Admitting: Occupational Therapy

## 2019-06-23 ENCOUNTER — Other Ambulatory Visit: Payer: Self-pay

## 2019-06-23 DIAGNOSIS — M6281 Muscle weakness (generalized): Secondary | ICD-10-CM

## 2019-06-23 DIAGNOSIS — M25521 Pain in right elbow: Secondary | ICD-10-CM

## 2019-06-23 DIAGNOSIS — M25621 Stiffness of right elbow, not elsewhere classified: Secondary | ICD-10-CM

## 2019-06-23 NOTE — Therapy (Signed)
[facility] [address] [place], [place], [address] Phone: [phone]   Fax:  [phone]  Occupational Therapy Treatment  Patient Details  Name: Bailey Bailey MRN: [id number] Date of Birth: 06-10-1991 Referring Provider (OT): Dr. Griffin Basil   Encounter Date: 06/23/2019  OT End of Session - 06/23/19 1838    Visit Number  3    Number of Visits  15    Date for OT Re-Evaluation  08/09/19    Authorization Type  MCD -    Authorization Time Period  3 visits approved 06/16/19 - 07/06/19    Authorization - Visit Number  2    Authorization - Number of Visits  3    OT Start Time  1800    OT Stop Time  1845    OT Time Calculation (min)  45 min    Activity Tolerance  Patient limited by pain       Past Medical History:  Diagnosis Date  . Anemia   . Anxiety    panic attacks  . Asthma    inhaler PRN- rarely uses  . History of postpartum depression    mild- no meds  . Hx of chlamydia infection 07/2013  . Hx of tooth extraction 2015    Past Surgical History:  Procedure Laterality Date  . CESAREAN SECTION  04/16/2012   Procedure: CESAREAN SECTION;  Surgeon: Margarette Asal, MD;  Location: [facility];  Service: Gynecology;  Laterality: N/A;  . CESAREAN SECTION N/A 08/03/2015   Procedure: CESAREAN SECTION;  Surgeon: Molli Posey, MD;  Location: [facility];  Service: Obstetrics;  Laterality: N/A;  Repeat edc 08/10/15 nkda  . NO PAST SURGERIES    . ORIF HUMERUS FRACTURE Right 04/24/2019   Procedure: OPEN REDUCTION INTERNAL FIXATION (ORIF) DISTAL HUMERUS FRACTURE;  Surgeon: Hiram Gash, MD;  Location: [facility];  Service: Orthopedics;  Laterality: Right;    There were no vitals filed for this visit.  Subjective Assessment - 06/23/19 1807    Subjective   I was stretching it Friday and heard a "pop" and haven't really done my ex's since. But I am using my Rt hand more like to put up my hair and holding pots when I'm cooking   Pertinent History  s/p ORIF Rt distal humerus fx w/ ulnar neurolysis and olecranon osteotomy 04/24/19    Limitations  no strengthening yet    Currently in Pain?  Yes    Pain Score  0-No pain   up to 9/10 w/ P/ROM   Pain Location  Elbow    Pain Orientation  Right    Pain Descriptors / Indicators  Aching    Pain Type  Acute pain    Pain Onset  More than a month ago    Pain Frequency  Intermittent    Aggravating Factors   movement, P/ROM    Pain Relieving Factors  rest, OTC meds       CLINIC OPERATION CHANGES: Outpatient Neuro Rehab is open at lower capacity following universal masking, social distancing, and patient screening.  The patient's COVID risk of complications score is 0.   Pt reports a "pop" sound after stretching last Friday, however when therapist assessed, did not feel any popping and felt the same with passive stretching as before.  A/ROM and P/ROM in elbow flexion and extension seated. Followed by light strengthening in elbow flexion w/ 1 lb weight x 10 reps, and weighted stretch in elbow ext with 2 lb weight.  Supine: elbow ext against gravity x 10 reps w/ max difficulty and weighted stretch in elbow flex w/ 2 lb weight w/ pain.  UBE x 8 min. Level 3 for elbow ROM and UB conditioning                    OT Short Term Goals - 06/23/19 1839      OT SHORT TERM GOAL #1   Title  Independent with initial HEP    Baseline  Issued, will need review    Time  4    Period  Weeks    Status  Achieved      OT SHORT TERM GOAL #2   Title  Pt to increase elbow flexion and extension by 10* or greater RUE in prep for functional tasks    Baseline  flex = 83*, ext = -53*    Time  4    Period  Weeks    Status  On-going      OT SHORT TERM GOAL #3   Title  Pain less than or equal to 5/10 Rt elbow with P/ROM    Baseline  10/10    Time  4    Period  Weeks    Status  On-going      OT SHORT TERM GOAL #4   Title  Pt to use Rt dominant hand at least 25% of the time for  BADLS    Baseline  none, using Lt non-dominant hand    Time  4    Period  Weeks    Status  On-going      OT SHORT TERM GOAL #5   Title  Grip strength Rt hand to improve to 15 lbs in prep for gripping/opening jars and containers    Baseline  8 lbs    Time  4    Period  Weeks    Status  Achieved   16 lbs       OT Long Term Goals - 06/09/19 1832      OT LONG TERM GOAL #1   Title  Independent with updated strengthening HEP    Baseline  Dependent d/t current precautions    Time  8    Period  Weeks    Status  New      OT LONG TERM GOAL #2   Title  Pt to use RUE as dominant UE for 50% or greater of ADLS    Baseline  None, only using LUE    Time  8    Period  Weeks    Status  New      OT LONG TERM GOAL #3   Title  RUE elbow flexion to be 115* or greater for eating/grooming tasks    Baseline  83*    Time  8    Period  Weeks    Status  New      OT LONG TERM GOAL #4   Title  Elbow extension to be -33* or better for reaching tasks    Baseline  -53*    Time  8    Period  Weeks    Status  New      OT LONG TERM GOAL #5   Title  Pt to demo shoulder and forearm pronation WFL's for functional reaching tasks    Baseline  sh flex = 105*, pron = 68*    Time  8    Period  Weeks    Status  New  Long Term Additional Goals   Additional Long Term Goals  Yes      OT LONG TERM GOAL #6   Title  Grip strength Rt hand to be 30 lbs or greater for opening jars/containers    Baseline  8 lbs    Time  8    Period  Weeks    Status  New            Plan - 06/23/19 1848    Clinical Impression Statement  Pt reports using RUE more functionally including helping to put up hair and with cooking tasks. However ROM remains very limited (? due to scar tissue and possibly HO). Pt w/ slightly more tolerance to P/ROM    Occupational performance deficits (Please refer to evaluation for details):  ADL's;IADL's    Body Structure / Function / Physical Skills  ADL;ROM;Edema;IADL;Scar  mobility;Sensation;Fascial restriction;Strength;Coordination;UE functional use    Rehab Potential  Good    OT Frequency  1x / week   for 3 weeks, followed by 2x/wk for 4 weeks   OT Treatment/Interventions  Self-care/ADL training;Therapeutic exercise;Aquatic Therapy;Ultrasound;Neuromuscular education;Manual Therapy;Splinting;Therapeutic activities;Cryotherapy;DME and/or AE instruction;Compression bandaging;Scar mobilization;Moist Heat;Passive range of motion;Patient/family education    Plan  assess ROM and progress towards STG's and request more visits from MCD    Consulted and Agree with Plan of Care  Patient       Patient will benefit from skilled therapeutic intervention in order to improve the following deficits and impairments:   Body Structure / Function / Physical Skills: ADL, ROM, Edema, IADL, Scar mobility, Sensation, Fascial restriction, Strength, Coordination, UE functional use       Visit Diagnosis: 1. Stiffness of right elbow, not elsewhere classified   2. Pain in right elbow   3. Muscle weakness (generalized)       Problem List Patient Active Problem List   Diagnosis Date Noted  . Closed bicondylar fracture of distal humerus 04/24/2019  . S/P cesarean section 08/05/2015    Kelli ChurnBallie, Zakyra [place] Johnson, OTR/L 06/23/2019, 6:50 PM  [facility] [address] [place], [place], [address] Phone: [phone]   Fax:  [phone]  Name: Bailey Bailey MRN: [id number] Date of Birth: 08-May-1991

## 2019-06-30 ENCOUNTER — Ambulatory Visit: Payer: Medicaid Other | Attending: Orthopaedic Surgery | Admitting: Occupational Therapy

## 2019-06-30 ENCOUNTER — Other Ambulatory Visit: Payer: Self-pay

## 2019-06-30 DIAGNOSIS — M25621 Stiffness of right elbow, not elsewhere classified: Secondary | ICD-10-CM | POA: Diagnosis present

## 2019-06-30 DIAGNOSIS — M25521 Pain in right elbow: Secondary | ICD-10-CM | POA: Insufficient documentation

## 2019-06-30 DIAGNOSIS — M6281 Muscle weakness (generalized): Secondary | ICD-10-CM | POA: Diagnosis present

## 2019-06-30 NOTE — Therapy (Signed)
[facility] [address] [place], [place], [address] Phone: [phone]   Fax:  [phone]  Occupational Therapy Treatment  Patient Details  Name: Bailey Bailey MRN: [id number] Date of Birth: 09-10-1991 Referring Provider (OT): Dr. Griffin Basil   Encounter Date: 06/30/2019  OT End of Session - 06/30/19 1738    Visit Number  4    Number of Visits  15    Date for OT Re-Evaluation  08/09/19    Authorization Type  MCD -    Authorization Time Period  3 visits approved 06/16/19 - 07/06/19    Authorization - Visit Number  3    Authorization - Number of Visits  3    OT Start Time  3716    OT Stop Time  1735    OT Time Calculation (min)  45 min    Activity Tolerance  Patient limited by pain    Behavior During Therapy  Anxious       Past Medical History:  Diagnosis Date  . Anemia   . Anxiety    panic attacks  . Asthma    inhaler PRN- rarely uses  . History of postpartum depression    mild- no meds  . Hx of chlamydia infection 07/2013  . Hx of tooth extraction 2015    Past Surgical History:  Procedure Laterality Date  . CESAREAN SECTION  04/16/2012   Procedure: CESAREAN SECTION;  Surgeon: Margarette Asal, MD;  Location: [facility];  Service: Gynecology;  Laterality: N/A;  . CESAREAN SECTION N/A 08/03/2015   Procedure: CESAREAN SECTION;  Surgeon: Molli Posey, MD;  Location: [facility];  Service: Obstetrics;  Laterality: N/A;  Repeat edc 08/10/15 nkda  . NO PAST SURGERIES    . ORIF HUMERUS FRACTURE Right 04/24/2019   Procedure: OPEN REDUCTION INTERNAL FIXATION (ORIF) DISTAL HUMERUS FRACTURE;  Surgeon: Hiram Gash, MD;  Location: [facility];  Service: Orthopedics;  Laterality: Right;    There were no vitals filed for this visit.  Subjective Assessment - 06/30/19 1649    Pertinent History  s/p ORIF Rt distal humerus fx w/ ulnar neurolysis and olecranon osteotomy 04/24/19    Limitations  no strengthening yet    Currently in  Pain?  Yes    Pain Score  10-Worst pain ever   Only with P/ROM   Pain Location  Elbow    Pain Orientation  Right    Pain Descriptors / Indicators  Aching    Pain Type  Acute pain    Pain Onset  More than a month ago    Pain Frequency  Intermittent    Aggravating Factors   movement, P/ROM in flexion worse than extension    Pain Relieving Factors  rest, OTC meds       CLINIC OPERATION CHANGES: Outpatient Neuro Rehab is open at lower capacity following universal masking, social distancing, and patient screening.  The patient's COVID risk of complications score is 0.  Hot pack to Rt elbow while simultaneously reviewing and assessing STG's.  P/ROM to elbow in flexion and extension with more pain in flexion, but ROM also has improved in flexion, not in extension.  Wall slides in shoulder flexion and abduction secondary to shoulder also has gotten stiff. Encouraged pt to use RUE as much as able in daily routines including: eating, putting away groceries and dishes, etc.  Gripper set at level 1 resistance to pick up blocks for sustained grip strength Rt hand w/ no difficulty, however  could not tolerate level 2.  UBE x 5 min. Level 3 for elbow ROM and UB conditoning                     OT Short Term Goals - 06/30/19 1705      OT SHORT TERM GOAL #1   Title  Independent with initial HEP    Baseline  Issued, will need review    Time  4    Period  Weeks    Status  Achieved      OT SHORT TERM GOAL #2   Title  Pt to increase elbow flexion and extension by 10* or greater RUE in prep for functional tasks    Baseline  flex = 83*, ext = -53*    Time  4    Period  Weeks    Status  Partially Met   flex = 102*, ext = -53*     OT SHORT TERM GOAL #3   Title  Pain less than or equal to 5/10 Rt elbow with P/ROM    Baseline  10/10    Time  4    Period  Weeks    Status  Not Met   remains up to 10/10 w/ passive elbow flexion     OT SHORT TERM GOAL #4   Title  Pt to use Rt  dominant hand at least 25% of the time for BADLS    Baseline  none, using Lt non-dominant hand    Time  4    Period  Weeks    Status  On-going   now using 10%     OT SHORT TERM GOAL #5   Title  Grip strength Rt hand to improve to 15 lbs in prep for gripping/opening jars and containers    Baseline  8 lbs    Time  4    Period  Weeks    Status  Achieved   16 lbs       OT Long Term Goals - 06/09/19 1832      OT LONG TERM GOAL #1   Title  Independent with updated strengthening HEP    Baseline  Dependent d/t current precautions    Time  8    Period  Weeks    Status  New      OT LONG TERM GOAL #2   Title  Pt to use RUE as dominant UE for 50% or greater of ADLS    Baseline  None, only using LUE    Time  8    Period  Weeks    Status  New      OT LONG TERM GOAL #3   Title  RUE elbow flexion to be 115* or greater for eating/grooming tasks    Baseline  83*    Time  8    Period  Weeks    Status  New      OT LONG TERM GOAL #4   Title  Elbow extension to be -33* or better for reaching tasks    Baseline  -53*    Time  8    Period  Weeks    Status  New      OT LONG TERM GOAL #5   Title  Pt to demo shoulder and forearm pronation WFL's for functional reaching tasks    Baseline  sh flex = 105*, pron = 68*    Time  8    Period  Weeks  Status  New      Long Term Additional Goals   Additional Long Term Goals  Yes      OT LONG TERM GOAL #6   Title  Grip strength Rt hand to be 30 lbs or greater for opening jars/containers    Baseline  8 lbs    Time  8    Period  Weeks    Status  New            Plan - 06/30/19 1725    Clinical Impression Statement  Pt has met 2.5/5 STG's. Pain remains biggest factor. Pt has made improvement in elbow flexion but not in extension    Occupational performance deficits (Please refer to evaluation for details):  ADL's;IADL's    Body Structure / Function / Physical Skills  ADL;ROM;Edema;IADL;Scar mobility;Sensation;Fascial  restriction;Strength;Coordination;UE functional use    Rehab Potential  Good    Comorbidities Affecting Occupational Performance:  None    OT Frequency  2x / week    OT Duration  6 weeks    OT Treatment/Interventions  Self-care/ADL training;Therapeutic exercise;Aquatic Therapy;Ultrasound;Neuromuscular education;Manual Therapy;Splinting;Therapeutic activities;Cryotherapy;DME and/or AE instruction;Compression bandaging;Scar mobilization;Moist Heat;Passive range of motion;Patient/family education    Plan  check for MCD authorization, continue A/ROM, P/ROM, and strengthening RUE    Consulted and Agree with Plan of Care  Patient       Patient will benefit from skilled therapeutic intervention in order to improve the following deficits and impairments:   Body Structure / Function / Physical Skills: ADL, ROM, Edema, IADL, Scar mobility, Sensation, Fascial restriction, Strength, Coordination, UE functional use       Visit Diagnosis: 1. Stiffness of right elbow, not elsewhere classified   2. Pain in right elbow   3. Muscle weakness (generalized)       Problem List Patient Active Problem List   Diagnosis Date Noted  . Closed bicondylar fracture of distal humerus 04/24/2019  . S/P cesarean section 08/05/2015    Carey Bullocks, OTR/L 06/30/2019, 5:39 PM  [facility] [address] [place], [place], [address] Phone: [phone]   Fax:  [phone]  Name: Kyliyah Stirn MRN: [id number] Date of Birth: Oct 13, 1991

## 2019-07-24 ENCOUNTER — Encounter

## 2019-07-28 ENCOUNTER — Ambulatory Visit: Payer: Medicaid Other | Attending: Orthopaedic Surgery | Admitting: Occupational Therapy

## 2019-07-28 ENCOUNTER — Other Ambulatory Visit: Payer: Self-pay

## 2019-07-28 DIAGNOSIS — M6281 Muscle weakness (generalized): Secondary | ICD-10-CM | POA: Diagnosis present

## 2019-07-28 DIAGNOSIS — M25521 Pain in right elbow: Secondary | ICD-10-CM

## 2019-07-28 DIAGNOSIS — M25621 Stiffness of right elbow, not elsewhere classified: Secondary | ICD-10-CM | POA: Diagnosis present

## 2019-07-28 DIAGNOSIS — M25611 Stiffness of right shoulder, not elsewhere classified: Secondary | ICD-10-CM | POA: Insufficient documentation

## 2019-07-28 NOTE — Therapy (Signed)
[facility] [address] [place], [place], [address] Phone: [phone]   Fax:  [phone]  Occupational Therapy Treatment  Patient Details  Name: Bailey Bailey MRN: [id number] Date of Birth: [birth year] Referring Provider (OT): Dr. Griffin Basil   Encounter Date: 07/28/2019  OT End of Session - 07/28/19 1009    Visit Number  5    Number of Visits  15    Date for OT Re-Evaluation  08/09/19    Authorization Type  MCD -    Authorization Time Period  3 visits approved 06/16/19 - 07/06/19, approved 12 visits from 8/4 - 09/07/19    Authorization - Visit Number  1    Authorization - Number of Visits  12    OT Start Time  0930    OT Stop Time  1013    OT Time Calculation (min)  43 min    Activity Tolerance  Patient tolerated treatment well       Past Medical History:  Diagnosis Date  . Anemia   . Anxiety    panic attacks  . Asthma    inhaler PRN- rarely uses  . History of postpartum depression    mild- no meds  . Hx of chlamydia infection 07/2013  . Hx of tooth extraction 2015    Past Surgical History:  Procedure Laterality Date  . CESAREAN SECTION  04/16/2012   Procedure: CESAREAN SECTION;  Surgeon: Margarette Asal, MD;  Location: [facility];  Service: Gynecology;  Laterality: N/A;  . CESAREAN SECTION N/A 08/03/2015   Procedure: CESAREAN SECTION;  Surgeon: Molli Posey, MD;  Location: [facility];  Service: Obstetrics;  Laterality: N/A;  Repeat edc 08/10/15 nkda  . NO PAST SURGERIES    . ORIF HUMERUS FRACTURE Right 04/24/2019   Procedure: OPEN REDUCTION INTERNAL FIXATION (ORIF) DISTAL HUMERUS FRACTURE;  Surgeon: Hiram Gash, MD;  Location: [facility];  Service: Orthopedics;  Laterality: Right;    There were no vitals filed for this visit.  Subjective Assessment - 07/28/19 0936    Subjective   I forgot my doctors appt with Dr. Griffin Basil    Pertinent History  s/p ORIF Rt distal humerus fx w/ ulnar neurolysis and olecranon  osteotomy 04/24/19    Currently in Pain?  Yes    Pain Score  4     Pain Location  Elbow    Pain Orientation  Right    Pain Descriptors / Indicators  Aching    Pain Type  Acute pain    Pain Onset  More than a month ago    Pain Frequency  Intermittent    Aggravating Factors   movement, P/ROM    Pain Relieving Factors  rest         [facility] OT Assessment - 07/28/19 0001      AROM   Overall AROM Comments  RUE flex = 115*, ext = -50*. Sup/pronation WFL's at this time               OT Treatments/Exercises (OP) - 07/28/19 0001      ADLs   ADL Comments  Discussed functional tasks to perform w/ RUE. Simulated eating tasks w/o difficulty Rt hand      Exercises   Exercises  Elbow      Elbow Exercises   Other elbow exercises  Pt issued strengthening HEP - see pt instructions for details. Pt performed each x 10 reps    Other elbow exercises  P/ROM to elbow  in flexion and extension x 5 reps each, holding 10 sec. Pt was encouraged to use RUE functionally for eating, grooming tasks      Additional Elbow Exercises   UBE (Upper Arm Bike)  x 8 min. (level 3)              OT Education - 07/28/19 1008    Education Details  strengthening HEP, Functional use of RUE    Person(s) Educated  Patient    Methods  Explanation;Demonstration;Handout    Comprehension  Verbalized understanding;Returned demonstration       OT Short Term Goals - 06/30/19 1705      OT SHORT TERM GOAL #1   Title  Independent with initial HEP    Baseline  Issued, will need review    Time  4    Period  Weeks    Status  Achieved      OT SHORT TERM GOAL #2   Title  Pt to increase elbow flexion and extension by 10* or greater RUE in prep for functional tasks    Baseline  flex = 83*, ext = -53*    Time  4    Period  Weeks    Status  Partially Met   flex = 102*, ext = -53*     OT SHORT TERM GOAL #3   Title  Pain less than or equal to 5/10 Rt elbow with P/ROM    Baseline  10/10    Time  4    Period   Weeks    Status  Not Met   remains up to 10/10 w/ passive elbow flexion     OT SHORT TERM GOAL #4   Title  Pt to use Rt dominant hand at least 25% of the time for BADLS    Baseline  none, using Lt non-dominant hand    Time  4    Period  Weeks    Status  On-going   now using 10%     OT SHORT TERM GOAL #5   Title  Grip strength Rt hand to improve to 15 lbs in prep for gripping/opening jars and containers    Baseline  8 lbs    Time  4    Period  Weeks    Status  Achieved   16 lbs       OT Long Term Goals - 07/28/19 1427      OT LONG TERM GOAL #1   Title  Independent with updated strengthening HEP    Baseline  Dependent d/t current precautions    Time  8    Period  Weeks    Status  Achieved      OT LONG TERM GOAL #2   Title  Pt to use RUE as dominant UE for 50% or greater of ADLS    Baseline  None, only using LUE    Time  8    Period  Weeks    Status  On-going      OT LONG TERM GOAL #3   Title  RUE elbow flexion to be 120* or greater for eating/grooming tasks    Baseline  83*    Time  8    Period  Weeks    Status  Revised   flex = 115* as of 07/28/19     OT LONG TERM GOAL #4   Title  Elbow extension to be -33* or better for reaching tasks    Baseline  -53*  Time  8    Period  Weeks    Status  On-going      OT LONG TERM GOAL #5   Title  Pt to demo shoulder and forearm pronation WFL's for functional reaching tasks    Baseline  sh flex = 105*, pron = 68*    Time  8    Period  Weeks    Status  Partially Met   met w/ forearm     OT LONG TERM GOAL #6   Title  Grip strength Rt hand to be 30 lbs or greater for opening jars/containers    Baseline  8 lbs    Time  8    Period  Weeks    Status  New            Plan - 07/28/19 1425    Clinical Impression Statement  Pt with improvements in elbow flexion since last seen. Pt forgot follow up visit w/ orthopedist and encouraged pt to reschedule ASAP    Occupational performance deficits (Please refer to  evaluation for details):  ADL's;IADL's    Body Structure / Function / Physical Skills  ADL;ROM;Edema;IADL;Scar mobility;Sensation;Fascial restriction;Strength;Coordination;UE functional use    Rehab Potential  Good    OT Frequency  2x / week    OT Duration  6 weeks    OT Treatment/Interventions  Self-care/ADL training;Therapeutic exercise;Aquatic Therapy;Ultrasound;Neuromuscular education;Manual Therapy;Splinting;Therapeutic activities;Cryotherapy;DME and/or AE instruction;Compression bandaging;Scar mobilization;Moist Heat;Passive range of motion;Patient/family education    Plan  continue ROM and strengthening Rt elbow and hand, assess grip strength and remaining STG's and progress towards LTGs    Consulted and Agree with Plan of Care  Patient       Patient will benefit from skilled therapeutic intervention in order to improve the following deficits and impairments:   Body Structure / Function / Physical Skills: ADL, ROM, Edema, IADL, Scar mobility, Sensation, Fascial restriction, Strength, Coordination, UE functional use       Visit Diagnosis: 1. Stiffness of right elbow, not elsewhere classified   2. Pain in right elbow   3. Muscle weakness (generalized)   4. Stiffness of right shoulder, not elsewhere classified       Problem List Patient Active Problem List   Diagnosis Date Noted  . Closed bicondylar fracture of distal humerus 04/24/2019  . S/P cesarean section 08/05/2015    Carey Bullocks, OTR/L 07/28/2019, 2:31 PM  [facility] [address] [place], [place], [address] Phone: [phone]   Fax:  [phone]  Name: Bailey Bailey MRN: [id number] Date of Birth: 1991/06/02

## 2019-07-28 NOTE — Patient Instructions (Signed)
  USE RT ARM FOR:  1) Eating 90% or more  2) Brushing teeth 3) Putting in Rt earring 4) Brusing 75% of hair 5) Washing 75% of hair  Flexion (Resistive)    Hold a can weighing __2-3__ lbs in hand and bend elbow, keeping wrist straight. Support elbow with folded towel on table, or hold close to body. Hold __2__ seconds. Repeat _10-15___ times. Do _3___ sessions per day.  Elbow Extension: Resisted    Lie on back, _1___ pound weight in right hand, arm up, elbow bent and supported. Straighten elbow. Return slowly. Repeat __10-15__ times per set.  Do __3__ sessions per day.

## 2019-07-30 ENCOUNTER — Ambulatory Visit: Payer: Medicaid Other | Admitting: Occupational Therapy

## 2019-08-05 ENCOUNTER — Ambulatory Visit: Payer: Medicaid Other | Admitting: Occupational Therapy

## 2019-08-06 ENCOUNTER — Ambulatory Visit: Payer: Medicaid Other | Admitting: Occupational Therapy

## 2019-08-11 ENCOUNTER — Other Ambulatory Visit: Payer: Self-pay

## 2019-08-11 ENCOUNTER — Ambulatory Visit: Payer: Medicaid Other | Admitting: Occupational Therapy

## 2019-08-11 DIAGNOSIS — M6281 Muscle weakness (generalized): Secondary | ICD-10-CM

## 2019-08-11 DIAGNOSIS — M25621 Stiffness of right elbow, not elsewhere classified: Secondary | ICD-10-CM | POA: Diagnosis not present

## 2019-08-11 DIAGNOSIS — M25521 Pain in right elbow: Secondary | ICD-10-CM

## 2019-08-11 NOTE — Therapy (Signed)
[facility] [address] [place], [place], [address] Phone: [phone]   Fax:  [phone]  Occupational Therapy Treatment  Patient Details  Name: Bailey Bailey MRN: [id number] Date of Birth: 1991-12-19 Referring Provider (OT): Dr. Griffin Basil   Encounter Date: 08/11/2019  OT End of Session - 08/11/19 1352    Visit Number  6    Number of Visits  15    Date for OT Re-Evaluation  08/09/19    Authorization Type  MCD -    Authorization Time Period  3 visits approved 06/16/19 - 07/06/19, approved 12 visits from 8/4 - 09/07/19    Authorization - Visit Number  2    Authorization - Number of Visits  12    OT Start Time  1315    OT Stop Time  1400    OT Time Calculation (min)  45 min    Activity Tolerance  Patient tolerated treatment well       Past Medical History:  Diagnosis Date  . Anemia   . Anxiety    panic attacks  . Asthma    inhaler PRN- rarely uses  . History of postpartum depression    mild- no meds  . Hx of chlamydia infection 07/2013  . Hx of tooth extraction 2015    Past Surgical History:  Procedure Laterality Date  . CESAREAN SECTION  04/16/2012   Procedure: CESAREAN SECTION;  Surgeon: Margarette Asal, MD;  Location: [facility];  Service: Gynecology;  Laterality: N/A;  . CESAREAN SECTION N/A 08/03/2015   Procedure: CESAREAN SECTION;  Surgeon: Molli Posey, MD;  Location: [facility];  Service: Obstetrics;  Laterality: N/A;  Repeat edc 08/10/15 nkda  . NO PAST SURGERIES    . ORIF HUMERUS FRACTURE Right 04/24/2019   Procedure: OPEN REDUCTION INTERNAL FIXATION (ORIF) DISTAL HUMERUS FRACTURE;  Surgeon: Hiram Gash, MD;  Location: [facility];  Service: Orthopedics;  Laterality: Right;    There were no vitals filed for this visit.  Subjective Assessment - 08/11/19 1317    Subjective   I saw Dr. Griffin Basil this morning and he is going to either try some sling thing (brace) to straighten it out or do surgery, but he  will decide on September 29th.    Pertinent History  s/p ORIF Rt distal humerus fx w/ ulnar neurolysis and olecranon osteotomy 04/24/19    Limitations  no precautions at this time    Currently in Pain?  Yes    Pain Score  3     Pain Location  Elbow    Pain Orientation  Right    Pain Descriptors / Indicators  Aching    Pain Type  Acute pain    Pain Onset  More than a month ago    Pain Frequency  Intermittent    Aggravating Factors   P/ROM    Pain Relieving Factors  Rest       Grip strength: 31, 25, 20 lbs Rt hand Pt admits she has not done HEP's. Pt also missed 2 appointments last week but reports she did not have transportation.  P/ROM in elbow flexion and extension x 5 reps each holding 10 sec. Reviewed HEP previously issued on 07/28/19 - pt needed encouragement to comply with proper technique and doing recommended frequency.  Gripper set at level 2 resistance to pick up blocks Rt hand for sustained grip strength - pt often needed assist from Lt hand to complete, and 1 rest break UBE x  5 min. Level 5 for elbow ROM                        OT Short Term Goals - 06/30/19 1705      OT SHORT TERM GOAL #1   Title  Independent with initial HEP    Baseline  Issued, will need review    Time  4    Period  Weeks    Status  Achieved      OT SHORT TERM GOAL #2   Title  Pt to increase elbow flexion and extension by 10* or greater RUE in prep for functional tasks    Baseline  flex = 83*, ext = -53*    Time  4    Period  Weeks    Status  Partially Met   flex = 102*, ext = -53*     OT SHORT TERM GOAL #3   Title  Pain less than or equal to 5/10 Rt elbow with P/ROM    Baseline  10/10    Time  4    Period  Weeks    Status  Not Met   remains up to 10/10 w/ passive elbow flexion     OT SHORT TERM GOAL #4   Title  Pt to use Rt dominant hand at least 25% of the time for BADLS    Baseline  none, using Lt non-dominant hand    Time  4    Period  Weeks    Status  On-going    now using 10%     OT SHORT TERM GOAL #5   Title  Grip strength Rt hand to improve to 15 lbs in prep for gripping/opening jars and containers    Baseline  8 lbs    Time  4    Period  Weeks    Status  Achieved   16 lbs       OT Long Term Goals - 08/11/19 1412      OT LONG TERM GOAL #1   Title  Independent with updated strengthening HEP    Baseline  Dependent d/t current precautions    Time  8    Period  Weeks    Status  Achieved      OT LONG TERM GOAL #2   Title  Pt to use RUE as dominant UE for 50% or greater of ADLS    Baseline  None, only using LUE    Time  8    Period  Weeks    Status  On-going      OT LONG TERM GOAL #3   Title  RUE elbow flexion to be 120* or greater for eating/grooming tasks    Baseline  83*    Time  8    Period  Weeks    Status  Revised   flex = 115* as of 07/28/19     OT LONG TERM GOAL #4   Title  Elbow extension to be -33* or better for reaching tasks    Baseline  -53*    Time  8    Period  Weeks    Status  On-going      OT LONG TERM GOAL #5   Title  Pt to demo shoulder and forearm pronation WFL's for functional reaching tasks    Baseline  sh flex = 105*, pron = 68*    Time  8    Period  Weeks    Status  Partially Met   met w/ forearm     OT LONG TERM GOAL #6   Title  Grip strength Rt hand to be 30 lbs or greater for opening jars/containers    Baseline  8 lbs    Time  8    Period  Weeks    Status  On-going   08/11/19 Rt = 31, 25, 20 lbs           Plan - 08/11/19 1413    Clinical Impression Statement  Pt progressing with elbow flexion and grip strength. Pt most limited in elbow extension. Pt also does not perform HEP at home which limits progress    Occupational performance deficits (Please refer to evaluation for details):  ADL's;IADL's    Body Structure / Function / Physical Skills  ADL;ROM;Edema;IADL;Scar mobility;Sensation;Fascial restriction;Strength;Coordination;UE functional use    Rehab Potential  Good    OT  Frequency  2x / week    OT Duration  6 weeks    OT Treatment/Interventions  Self-care/ADL training;Therapeutic exercise;Aquatic Therapy;Ultrasound;Neuromuscular education;Manual Therapy;Splinting;Therapeutic activities;Cryotherapy;DME and/or AE instruction;Compression bandaging;Scar mobilization;Moist Heat;Passive range of motion;Patient/family education    Plan  continue ROM and strengthening Rt elbow and hand, assess remaining STG's and progress towards LTGs    Consulted and Agree with Plan of Care  Patient       Patient will benefit from skilled therapeutic intervention in order to improve the following deficits and impairments:   Body Structure / Function / Physical Skills: ADL, ROM, Edema, IADL, Scar mobility, Sensation, Fascial restriction, Strength, Coordination, UE functional use       Visit Diagnosis: 1. Stiffness of right elbow, not elsewhere classified   2. Pain in right elbow   3. Muscle weakness (generalized)       Problem List Patient Active Problem List   Diagnosis Date Noted  . Closed bicondylar fracture of distal humerus 04/24/2019  . S/P cesarean section 08/05/2015    Carey Bullocks, OTR/L 08/11/2019, 2:15 PM  [facility] [address] [place], [place], [address] Phone: [phone]   Fax:  [phone]  Name: Zenita Kister MRN: [id number] Date of Birth: 28-Jul-1991

## 2019-08-13 ENCOUNTER — Ambulatory Visit: Payer: Medicaid Other | Admitting: Occupational Therapy

## 2019-08-18 ENCOUNTER — Ambulatory Visit: Payer: Medicaid Other | Admitting: Occupational Therapy

## 2019-08-20 ENCOUNTER — Ambulatory Visit: Payer: Medicaid Other | Admitting: Occupational Therapy

## 2019-09-08 ENCOUNTER — Ambulatory Visit: Payer: Medicaid Other | Attending: Orthopaedic Surgery | Admitting: Occupational Therapy

## 2019-09-10 ENCOUNTER — Ambulatory Visit: Payer: Medicaid Other | Admitting: Occupational Therapy

## 2019-09-22 ENCOUNTER — Other Ambulatory Visit: Payer: Self-pay | Admitting: Orthopaedic Surgery

## 2019-09-22 DIAGNOSIS — M25521 Pain in right elbow: Secondary | ICD-10-CM

## 2019-09-24 ENCOUNTER — Other Ambulatory Visit: Payer: Self-pay | Admitting: Orthopaedic Surgery

## 2019-09-24 DIAGNOSIS — M25521 Pain in right elbow: Secondary | ICD-10-CM

## 2019-09-29 ENCOUNTER — Other Ambulatory Visit: Payer: Medicaid Other

## 2019-09-30 NOTE — Therapy (Signed)
[facility] [address] [place], [place], [address] Phone: [phone]   Fax:  [phone]  Patient Details  Name: Bailey Bailey MRN: [id number] Date of Birth: 11-05-1991 Referring Provider:  Dr. Griffin Basil Encounter Date: 09/30/2019  OCCUPATIONAL THERAPY DISCHARGE SUMMARY  Visits from Start of Care: 6  Current functional level related to goals / functional outcomes: OT Short Term Goals - 06/30/19 1705      OT SHORT TERM GOAL #1   Title  Independent with initial HEP    Baseline  Issued, will need review    Time  4    Period  Weeks    Status  Achieved      OT SHORT TERM GOAL #2   Title  Pt to increase elbow flexion and extension by 10* or greater RUE in prep for functional tasks    Baseline  flex = 83*, ext = -53*    Time  4    Period  Weeks    Status  Partially Met   flex = 102*, ext = -53*     OT SHORT TERM GOAL #3   Title  Pain less than or equal to 5/10 Rt elbow with P/ROM    Baseline  10/10    Time  4    Period  Weeks    Status  Not Met   remains up to 10/10 w/ passive elbow flexion     OT SHORT TERM GOAL #4   Title  Pt to use Rt dominant hand at least 25% of the time for BADLS    Baseline  none, using Lt non-dominant hand    Time  4    Period  Weeks    Status  unknown     OT SHORT TERM GOAL #5   Title  Grip strength Rt hand to improve to 15 lbs in prep for gripping/opening jars and containers    Baseline  8 lbs    Time  4    Period  Weeks    Status  Achieved   16 lbs     Pt only met LTG #1 secondary to not returning after visit on 08/11/19.     Remaining deficits: ROM Rt elbow Strength Rt elbow Pain Rt elbow   Education / Equipment: HEP's, pain management strategies  Plan: Patient agrees to discharge.  Patient goals were partially met. Patient is being discharged due to not returning since the last visit.  ?????       Carey Bullocks, OTR/L 09/30/2019, 10:08 AM  [facility] [address] [place], [place], [address] Phone: [phone]   Fax:  [phone]

## 2019-10-28 ENCOUNTER — Other Ambulatory Visit: Payer: Medicaid Other

## 2019-11-05 ENCOUNTER — Inpatient Hospital Stay: Admission: RE | Admit: 2019-11-05 | Payer: Medicaid Other | Source: Ambulatory Visit

## 2019-11-17 ENCOUNTER — Ambulatory Visit
Admission: RE | Admit: 2019-11-17 | Discharge: 2019-11-17 | Disposition: A | Discharge status: Home / Self Care | Payer: Medicaid Other | Source: Ambulatory Visit | Attending: Orthopaedic Surgery | Admitting: Orthopaedic Surgery

## 2019-11-17 ENCOUNTER — Other Ambulatory Visit: Payer: Self-pay

## 2019-11-17 DIAGNOSIS — M25521 Pain in right elbow: Secondary | ICD-10-CM

## 2020-01-06 ENCOUNTER — Other Ambulatory Visit: Payer: Self-pay

## 2020-01-06 ENCOUNTER — Encounter (HOSPITAL_BASED_OUTPATIENT_CLINIC_OR_DEPARTMENT_OTHER): Payer: Self-pay | Admitting: Orthopaedic Surgery

## 2020-01-11 ENCOUNTER — Other Ambulatory Visit (HOSPITAL_COMMUNITY)
Admission: RE | Admit: 2020-01-11 | Discharge: 2020-01-11 | Disposition: A | Discharge status: Home / Self Care | Payer: Medicaid Other | Source: Ambulatory Visit | Attending: Orthopaedic Surgery | Admitting: Orthopaedic Surgery

## 2020-01-11 DIAGNOSIS — Z01812 Encounter for preprocedural laboratory examination: Secondary | ICD-10-CM | POA: Diagnosis not present

## 2020-01-11 DIAGNOSIS — Z20822 Contact with and (suspected) exposure to covid-19: Secondary | ICD-10-CM | POA: Diagnosis not present

## 2020-01-12 LAB — NOVEL CORONAVIRUS, NAA (HOSP ORDER, SEND-OUT TO REF LAB; TAT 18-24 HRS): SARS-CoV-2, NAA: NOT DETECTED
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
## 2020-01-13 NOTE — Anesthesia Preprocedure Evaluation (Addendum)
Anesthesia Evaluation  Patient identified by MRN, date of birth, ID band Patient awake    Reviewed: Allergy & Precautions, H&P , NPO status , Patient's Chart, lab work & pertinent test results  Airway Mallampati: I       Dental no notable dental hx. (+) Teeth Intact   Pulmonary asthma ,    Pulmonary exam normal breath sounds clear to auscultation       Cardiovascular negative cardio ROS Normal cardiovascular exam Rhythm:Regular Rate:Normal     Neuro/Psych Anxiety negative neurological ROS     GI/Hepatic negative GI ROS, Neg liver ROS,   Endo/Other  negative endocrine ROS  Renal/GU negative Renal ROS  negative genitourinary   Musculoskeletal   Abdominal Normal abdominal exam  (+)   Peds  Hematology negative hematology ROS (+)   Anesthesia Other Findings   Reproductive/Obstetrics negative OB ROS                            Anesthesia Physical  Anesthesia Plan  ASA: II  Anesthesia Plan: General   Post-op Pain Management:  Regional for Post-op pain   Induction: Intravenous  PONV Risk Score and Plan: 3 and Ondansetron, Dexamethasone, Midazolam and Scopolamine patch - Pre-op  Airway Management Planned: LMA  Additional Equipment: None  Intra-op Plan:   Post-operative Plan: Extubation in OR  Informed Consent: I have reviewed the patients History and Physical, chart, labs and discussed the procedure including the risks, benefits and alternatives for the proposed anesthesia with the patient or authorized representative who has indicated his/her understanding and acceptance.     Dental advisory given  Plan Discussed with: CRNA  Anesthesia Plan Comments:        Anesthesia Quick Evaluation

## 2020-01-14 ENCOUNTER — Encounter (HOSPITAL_BASED_OUTPATIENT_CLINIC_OR_DEPARTMENT_OTHER): Payer: Self-pay | Admitting: Orthopaedic Surgery

## 2020-01-14 ENCOUNTER — Other Ambulatory Visit: Payer: Self-pay

## 2020-01-14 ENCOUNTER — Ambulatory Visit (HOSPITAL_BASED_OUTPATIENT_CLINIC_OR_DEPARTMENT_OTHER): Payer: Medicaid Other | Admitting: Anesthesiology

## 2020-01-14 ENCOUNTER — Encounter (HOSPITAL_BASED_OUTPATIENT_CLINIC_OR_DEPARTMENT_OTHER)
Admission: RE | Disposition: A | Discharge status: Home / Self Care | Payer: Self-pay | Source: Home / Self Care | Attending: Orthopaedic Surgery

## 2020-01-14 ENCOUNTER — Ambulatory Visit (HOSPITAL_BASED_OUTPATIENT_CLINIC_OR_DEPARTMENT_OTHER)
Admission: RE | Admit: 2020-01-14 | Discharge: 2020-01-14 | Disposition: A | Discharge status: Home / Self Care | Payer: Medicaid Other | Attending: Orthopaedic Surgery | Admitting: Orthopaedic Surgery

## 2020-01-14 DIAGNOSIS — M24521 Contracture, right elbow: Secondary | ICD-10-CM | POA: Insufficient documentation

## 2020-01-14 DIAGNOSIS — T8484XA Pain due to internal orthopedic prosthetic devices, implants and grafts, initial encounter: Secondary | ICD-10-CM | POA: Diagnosis not present

## 2020-01-14 DIAGNOSIS — J45909 Unspecified asthma, uncomplicated: Secondary | ICD-10-CM | POA: Insufficient documentation

## 2020-01-14 DIAGNOSIS — Z8249 Family history of ischemic heart disease and other diseases of the circulatory system: Secondary | ICD-10-CM | POA: Diagnosis not present

## 2020-01-14 DIAGNOSIS — F419 Anxiety disorder, unspecified: Secondary | ICD-10-CM | POA: Diagnosis not present

## 2020-01-14 DIAGNOSIS — X58XXXA Exposure to other specified factors, initial encounter: Secondary | ICD-10-CM | POA: Diagnosis not present

## 2020-01-14 DIAGNOSIS — G5621 Lesion of ulnar nerve, right upper limb: Secondary | ICD-10-CM | POA: Diagnosis not present

## 2020-01-14 DIAGNOSIS — Z841 Family history of disorders of kidney and ureter: Secondary | ICD-10-CM | POA: Diagnosis not present

## 2020-01-14 DIAGNOSIS — Z820 Family history of epilepsy and other diseases of the nervous system: Secondary | ICD-10-CM | POA: Insufficient documentation

## 2020-01-14 DIAGNOSIS — Z791 Long term (current) use of non-steroidal anti-inflammatories (NSAID): Secondary | ICD-10-CM | POA: Diagnosis not present

## 2020-01-14 DIAGNOSIS — M25721 Osteophyte, right elbow: Secondary | ICD-10-CM | POA: Insufficient documentation

## 2020-01-14 DIAGNOSIS — Z833 Family history of diabetes mellitus: Secondary | ICD-10-CM | POA: Diagnosis not present

## 2020-01-14 DIAGNOSIS — D649 Anemia, unspecified: Secondary | ICD-10-CM | POA: Insufficient documentation

## 2020-01-14 HISTORY — PX: ARTHROTOMY: SHX5728

## 2020-01-14 HISTORY — PX: ULNAR NERVE TRANSPOSITION: SHX2595

## 2020-01-14 HISTORY — PX: HARDWARE REMOVAL: SHX979

## 2020-01-14 LAB — POCT PREGNANCY, URINE: Preg Test, Ur: NEGATIVE

## 2020-01-14 SURGERY — ULNAR NERVE DECOMPRESSION/TRANSPOSITION
Anesthesia: General | Site: Elbow | Laterality: Right

## 2020-01-14 MED ORDER — PROMETHAZINE HCL 25 MG/ML IJ SOLN
6.2500 mg | Freq: Once | INTRAMUSCULAR | Status: AC
  Administered 2020-01-14: 6.25 mg via INTRAVENOUS

## 2020-01-14 MED ORDER — FENTANYL CITRATE (PF) 100 MCG/2ML IJ SOLN
50.0000 ug | INTRAMUSCULAR | Status: DC | PRN
  Administered 2020-01-14: 100 ug via INTRAVENOUS

## 2020-01-14 MED ORDER — MIDAZOLAM HCL 2 MG/2ML IJ SOLN
INTRAMUSCULAR | Status: AC
  Filled 2020-01-14: qty 2

## 2020-01-14 MED ORDER — ONDANSETRON HCL 4 MG PO TABS: 4.0000 mg | ORAL_TABLET | Freq: Three times a day (TID) | ORAL | 1 refills | Status: AC | PRN

## 2020-01-14 MED ORDER — FENTANYL CITRATE (PF) 100 MCG/2ML IJ SOLN
INTRAMUSCULAR | Status: AC
  Filled 2020-01-14: qty 2

## 2020-01-14 MED ORDER — ROCURONIUM BROMIDE 100 MG/10ML IV SOLN
INTRAVENOUS | Status: DC | PRN
  Administered 2020-01-14: 60 mg via INTRAVENOUS

## 2020-01-14 MED ORDER — KETOROLAC TROMETHAMINE 30 MG/ML IJ SOLN: 30.0000 mg | Freq: Once | INTRAMUSCULAR | Status: DC | PRN

## 2020-01-14 MED ORDER — BUPIVACAINE LIPOSOME 1.3 % IJ SUSP
INTRAMUSCULAR | Status: DC | PRN
  Administered 2020-01-14 (×5): 2 mL via PERINEURAL

## 2020-01-14 MED ORDER — VANCOMYCIN HCL IN DEXTROSE 1-5 GM/200ML-% IV SOLN
INTRAVENOUS | Status: AC
  Filled 2020-01-14: qty 200

## 2020-01-14 MED ORDER — BUPIVACAINE-EPINEPHRINE (PF) 0.5% -1:200000 IJ SOLN
INTRAMUSCULAR | Status: DC | PRN
  Administered 2020-01-14 (×5): 3 mL via PERINEURAL

## 2020-01-14 MED ORDER — ONDANSETRON HCL 4 MG/2ML IJ SOLN
INTRAMUSCULAR | Status: AC
  Filled 2020-01-14: qty 2

## 2020-01-14 MED ORDER — SCOPOLAMINE 1 MG/3DAYS TD PT72
1.0000 | MEDICATED_PATCH | TRANSDERMAL | Status: DC
  Administered 2020-01-14: 1.5 mg via TRANSDERMAL

## 2020-01-14 MED ORDER — ACETAMINOPHEN 10 MG/ML IV SOLN
INTRAVENOUS | Status: AC
  Filled 2020-01-14: qty 100

## 2020-01-14 MED ORDER — DEXAMETHASONE SODIUM PHOSPHATE 10 MG/ML IJ SOLN
INTRAMUSCULAR | Status: AC
  Filled 2020-01-14: qty 1

## 2020-01-14 MED ORDER — OXYCODONE HCL 5 MG/5ML PO SOLN: 5.0000 mg | Freq: Once | ORAL | Status: DC | PRN

## 2020-01-14 MED ORDER — PROPOFOL 10 MG/ML IV BOLUS
INTRAVENOUS | Status: DC | PRN
  Administered 2020-01-14: 200 mg via INTRAVENOUS

## 2020-01-14 MED ORDER — SUGAMMADEX SODIUM 200 MG/2ML IV SOLN
INTRAVENOUS | Status: DC | PRN
  Administered 2020-01-14: 200 mg via INTRAVENOUS

## 2020-01-14 MED ORDER — DEXAMETHASONE SODIUM PHOSPHATE 4 MG/ML IJ SOLN
INTRAMUSCULAR | Status: DC | PRN
  Administered 2020-01-14: 5 mg via INTRAVENOUS

## 2020-01-14 MED ORDER — LACTATED RINGERS IV SOLN: INTRAVENOUS | Status: DC

## 2020-01-14 MED ORDER — FENTANYL CITRATE (PF) 100 MCG/2ML IJ SOLN
INTRAMUSCULAR | Status: DC | PRN
  Administered 2020-01-14: 100 ug via INTRAVENOUS

## 2020-01-14 MED ORDER — ACETAMINOPHEN 325 MG PO TABS: 325.0000 mg | ORAL_TABLET | ORAL | Status: DC | PRN

## 2020-01-14 MED ORDER — ONDANSETRON HCL 4 MG/2ML IJ SOLN
INTRAMUSCULAR | Status: DC | PRN
  Administered 2020-01-14: 4 mg via INTRAVENOUS

## 2020-01-14 MED ORDER — ACETAMINOPHEN 10 MG/ML IV SOLN
1000.0000 mg | Freq: Once | INTRAVENOUS | Status: AC
  Administered 2020-01-14: 1000 mg via INTRAVENOUS

## 2020-01-14 MED ORDER — ONDANSETRON HCL 4 MG/2ML IJ SOLN
4.0000 mg | Freq: Once | INTRAMUSCULAR | Status: AC
  Administered 2020-01-14: 4 mg via INTRAVENOUS

## 2020-01-14 MED ORDER — SCOPOLAMINE 1 MG/3DAYS TD PT72
MEDICATED_PATCH | TRANSDERMAL | Status: AC
  Filled 2020-01-14: qty 1

## 2020-01-14 MED ORDER — ACETAMINOPHEN 500 MG PO TABS: 1000.0000 mg | ORAL_TABLET | Freq: Three times a day (TID) | ORAL | 0 refills | Status: AC

## 2020-01-14 MED ORDER — VANCOMYCIN HCL 1000 MG IV SOLR
INTRAVENOUS | Status: DC | PRN
  Administered 2020-01-14: 09:00:00 1000 mg via INTRAVENOUS

## 2020-01-14 MED ORDER — LIDOCAINE 2% (20 MG/ML) 5 ML SYRINGE
INTRAMUSCULAR | Status: AC
  Filled 2020-01-14: qty 5

## 2020-01-14 MED ORDER — PROMETHAZINE HCL 25 MG/ML IJ SOLN
INTRAMUSCULAR | Status: AC
  Filled 2020-01-14: qty 1

## 2020-01-14 MED ORDER — ROCURONIUM BROMIDE 10 MG/ML (PF) SYRINGE
PREFILLED_SYRINGE | INTRAVENOUS | Status: AC
  Filled 2020-01-14: qty 10

## 2020-01-14 MED ORDER — MELOXICAM 7.5 MG PO TABS: 7.5000 mg | ORAL_TABLET | Freq: Every day | ORAL | 2 refills | Status: AC

## 2020-01-14 MED ORDER — OXYCODONE HCL 5 MG PO TABS: 5.0000 mg | ORAL_TABLET | Freq: Once | ORAL | Status: DC | PRN

## 2020-01-14 MED ORDER — MEPERIDINE HCL 25 MG/ML IJ SOLN: 6.2500 mg | INTRAMUSCULAR | Status: DC | PRN

## 2020-01-14 MED ORDER — ACETAMINOPHEN 160 MG/5ML PO SOLN: 325.0000 mg | ORAL | Status: DC | PRN

## 2020-01-14 MED ORDER — OXYCODONE HCL 5 MG PO TABS: ORAL_TABLET | ORAL | 0 refills | Status: AC

## 2020-01-14 MED ORDER — ONDANSETRON HCL 4 MG/2ML IJ SOLN
4.0000 mg | Freq: Once | INTRAMUSCULAR | Status: AC | PRN
  Administered 2020-01-14: 4 mg via INTRAVENOUS

## 2020-01-14 MED ORDER — VANCOMYCIN HCL 1000 MG IV SOLR
INTRAVENOUS | Status: DC | PRN
  Administered 2020-01-14: 1000 mg via TOPICAL

## 2020-01-14 MED ORDER — PROPOFOL 10 MG/ML IV BOLUS
INTRAVENOUS | Status: AC
  Filled 2020-01-14: qty 40

## 2020-01-14 MED ORDER — CHLORHEXIDINE GLUCONATE 4 % EX LIQD: 60.0000 mL | Freq: Once | CUTANEOUS | Status: DC

## 2020-01-14 MED ORDER — VANCOMYCIN HCL IN DEXTROSE 1-5 GM/200ML-% IV SOLN: 1000.0000 mg | INTRAVENOUS | Status: DC

## 2020-01-14 MED ORDER — MIDAZOLAM HCL 2 MG/2ML IJ SOLN
1.0000 mg | INTRAMUSCULAR | Status: DC | PRN
  Administered 2020-01-14: 09:00:00 2 mg via INTRAVENOUS

## 2020-01-14 MED ORDER — FENTANYL CITRATE (PF) 100 MCG/2ML IJ SOLN
25.0000 ug | INTRAMUSCULAR | Status: DC | PRN
  Administered 2020-01-14: 25 ug via INTRAVENOUS

## 2020-01-14 SURGICAL SUPPLY — 72 items
APL PRP STRL LF DISP 70% ISPRP (MISCELLANEOUS) ×1
BLADE SURG 10 STRL SS (BLADE) ×3 IMPLANT
BLADE SURG 15 STRL LF DISP TIS (BLADE) ×1 IMPLANT
BLADE SURG 15 STRL SS (BLADE) ×9
BNDG CMPR 9X4 STRL LF SNTH (GAUZE/BANDAGES/DRESSINGS) ×1
BNDG COHESIVE 4X5 TAN STRL (GAUZE/BANDAGES/DRESSINGS) ×3 IMPLANT
BNDG ELASTIC 4X5.8 VLCR STR LF (GAUZE/BANDAGES/DRESSINGS) ×5 IMPLANT
BNDG ESMARK 4X9 LF (GAUZE/BANDAGES/DRESSINGS) ×3 IMPLANT
CHLORAPREP W/TINT 26 (MISCELLANEOUS) ×3 IMPLANT
CLOSURE STERI-STRIP 1/2X4 (GAUZE/BANDAGES/DRESSINGS) ×1
CLSR STERI-STRIP ANTIMIC 1/2X4 (GAUZE/BANDAGES/DRESSINGS) ×2 IMPLANT
COVER BACK TABLE 60X90IN (DRAPES) ×4 IMPLANT
COVER MAYO STAND STRL (DRAPES) ×2 IMPLANT
COVER WAND RF STERILE (DRAPES) IMPLANT
CUFF TOURN SGL QUICK 18X3 (MISCELLANEOUS) ×2 IMPLANT
CUFF TOURN SGL QUICK 18X4 (TOURNIQUET CUFF) IMPLANT
CUFF TOURN SGL QUICK 24 (TOURNIQUET CUFF)
CUFF TRNQT CYL 24X4X16.5-23 (TOURNIQUET CUFF) IMPLANT
DECANTER SPIKE VIAL GLASS SM (MISCELLANEOUS) IMPLANT
DRAPE EXTREMITY T 121X128X90 (DISPOSABLE) ×3 IMPLANT
DRAPE IMP U-DRAPE 54X76 (DRAPES) ×6 IMPLANT
DRAPE OEC MINIVIEW 54X84 (DRAPES) ×2 IMPLANT
DRAPE U-SHAPE 47X51 STRL (DRAPES) ×3 IMPLANT
DRSG PAD ABDOMINAL 8X10 ST (GAUZE/BANDAGES/DRESSINGS) ×2 IMPLANT
ELECT REM PT RETURN 9FT ADLT (ELECTROSURGICAL) ×3
ELECTRODE REM PT RTRN 9FT ADLT (ELECTROSURGICAL) ×1 IMPLANT
EXT HOSE W/PLC CONNECTION (MISCELLANEOUS) ×3
EXTENSION HOSE W/PLC CONNECTON (MISCELLANEOUS) IMPLANT
GAUZE SPONGE 4X4 12PLY STRL (GAUZE/BANDAGES/DRESSINGS) ×3 IMPLANT
GAUZE XEROFORM 1X8 LF (GAUZE/BANDAGES/DRESSINGS) ×2 IMPLANT
GLOVE BIO SURGEON STRL SZ 6.5 (GLOVE) ×2 IMPLANT
GLOVE BIO SURGEONS STRL SZ 6.5 (GLOVE) ×1
GLOVE BIOGEL PI IND STRL 6.5 (GLOVE) ×1 IMPLANT
GLOVE BIOGEL PI IND STRL 7.0 (GLOVE) IMPLANT
GLOVE BIOGEL PI IND STRL 8 (GLOVE) ×1 IMPLANT
GLOVE BIOGEL PI INDICATOR 6.5 (GLOVE) ×4
GLOVE BIOGEL PI INDICATOR 7.0 (GLOVE) ×4
GLOVE BIOGEL PI INDICATOR 8 (GLOVE) ×2
GLOVE ECLIPSE 6.5 STRL STRAW (GLOVE) ×4 IMPLANT
GLOVE ECLIPSE 8.0 STRL XLNG CF (GLOVE) ×3 IMPLANT
GOWN STRL REUS W/ TWL LRG LVL3 (GOWN DISPOSABLE) ×1 IMPLANT
GOWN STRL REUS W/TWL LRG LVL3 (GOWN DISPOSABLE) ×6
GOWN STRL REUS W/TWL XL LVL3 (GOWN DISPOSABLE) ×3 IMPLANT
LOOP VESSEL MAXI BLUE (MISCELLANEOUS) ×2 IMPLANT
MANIFOLD NEPTUNE II (INSTRUMENTS) ×2 IMPLANT
NS IRRIG 1000ML POUR BTL (IV SOLUTION) ×3 IMPLANT
PACK BASIN DAY SURGERY FS (CUSTOM PROCEDURE TRAY) ×3 IMPLANT
PAD CAST 4YDX4 CTTN HI CHSV (CAST SUPPLIES) ×1 IMPLANT
PADDING CAST ABS 4INX4YD NS (CAST SUPPLIES) ×8
PADDING CAST ABS COTTON 4X4 ST (CAST SUPPLIES) IMPLANT
PADDING CAST COTTON 4X4 STRL (CAST SUPPLIES) ×3
PENCIL SMOKE EVACUATOR (MISCELLANEOUS) ×3 IMPLANT
SLEEVE SCD COMPRESS KNEE MED (MISCELLANEOUS) ×2 IMPLANT
SLING ARM FOAM STRAP LRG (SOFTGOODS) ×1 IMPLANT
SPLINT FAST PLASTER 5X30 (CAST SUPPLIES) ×20
SPLINT PLASTER CAST FAST 5X30 (CAST SUPPLIES) ×10 IMPLANT
STOCKINETTE 4X48 STRL (DRAPES) ×1 IMPLANT
STOCKINETTE IMPERVIOUS LG (DRAPES) ×2 IMPLANT
SUCTION FRAZIER HANDLE 10FR (MISCELLANEOUS) ×2
SUCTION TUBE FRAZIER 10FR DISP (MISCELLANEOUS) IMPLANT
SUT ETHILON 2 0 FS 18 (SUTURE) ×6 IMPLANT
SUT MNCRL AB 4-0 PS2 18 (SUTURE) ×3 IMPLANT
SUT VIC AB 0 CT1 27 (SUTURE)
SUT VIC AB 0 CT1 27XBRD ANBCTR (SUTURE) ×1 IMPLANT
SUT VIC AB 0 SH 27 (SUTURE) ×2 IMPLANT
SUT VIC AB 3-0 SH 27 (SUTURE) ×6
SUT VIC AB 3-0 SH 27X BRD (SUTURE) IMPLANT
SYR BULB 3OZ (MISCELLANEOUS) ×3 IMPLANT
TOWEL GREEN STERILE FF (TOWEL DISPOSABLE) ×5 IMPLANT
TUBE CONNECTING 20'X1/4 (TUBING) ×2
TUBE CONNECTING 20X1/4 (TUBING) ×2 IMPLANT
YANKAUER SUCT BULB TIP NO VENT (SUCTIONS) ×3 IMPLANT

## 2020-01-14 NOTE — Anesthesia Procedure Notes (Signed)
Anesthesia Regional Block: Supraclavicular block   Pre-Anesthetic Checklist: ,, timeout performed, Correct Patient, Correct Site, Correct Laterality, Correct Procedure, Correct Position, site marked, Risks and benefits discussed,  Surgical consent,  Pre-op evaluation,  At surgeon's request and post-op pain management  Laterality: Upper and Right  Prep: chloraprep       Needles:  Injection technique: Single-shot  Needle Type: Echogenic Stimulator Needle     Needle Length: 10cm  Needle Gauge: 21   Needle insertion depth: 2 cm   Additional Needles:   Procedures:,,,, ultrasound used (permanent image in chart),,,,  Narrative:  Start time: 01/14/2020 9:10 AM End time: 01/14/2020 9:15 AM Injection made incrementally with aspirations every 5 mL.  Performed by: Personally  Anesthesiologist: Leilani Able, MD

## 2020-01-14 NOTE — Progress Notes (Signed)
Assisted Dr. Hatchett with right, ultrasound guided, interscalene  block. Side rails up, monitors on throughout procedure. See vital signs in flow sheet. Tolerated Procedure well.  

## 2020-01-14 NOTE — Transfer of Care (Signed)
Immediate Anesthesia Transfer of Care Note  Patient: Bailey Bailey  Procedure(s) Performed: RIGHT ELBOW ARTHROTOMY WITH CAPSULAR EXCISION , REMOVAL RETAINED HARDWARE, NEUROPLASTY AND TRANSPOSITION,ULNAR NERVE AT ELBOW (Right Elbow) ARTHROTOMY (Right Elbow) HARDWARE REMOVAL (Right Elbow)  Patient Location: PACU  Anesthesia Type:General and Regional  Level of Consciousness: awake, alert  and oriented  Airway & Oxygen Therapy: Patient Spontanous Breathing and Patient connected to face mask oxygen  Post-op Assessment: Report given to RN and Post -op Vital signs reviewed and stable  Post vital signs: Reviewed and stable  Last Vitals:  Vitals Value Taken Time  BP 146/95 01/14/20 1115  Temp    Pulse 52 01/14/20 1119  Resp 7 01/14/20 1119  SpO2 100 % 01/14/20 1119  Vitals shown include unvalidated device data.  Last Pain:  Vitals:   01/14/20 0812  TempSrc: Oral  PainSc: 0-No pain      Patients Stated Pain Goal: 3 (01/14/20 3702)  Complications: No apparent anesthesia complications
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
## 2020-01-14 NOTE — Discharge Instructions (Signed)
NO TYLENOL PRODUCTS UNTIL 10:10 PM     ZOFRAN LAST GIVEN AT 2:30   Post Anesthesia Home Care Instructions  Activity: Get plenty of rest for the remainder of the day. A responsible individual must stay with you for 24 hours following the procedure.  For the next 24 hours, DO NOT: -Drive a car -Paediatric nurse -Drink alcoholic beverages -Take any medication unless instructed by your physician -Make any legal decisions or sign important papers.  Meals: Start with liquid foods such as gelatin or soup. Progress to regular foods as tolerated. Avoid greasy, spicy, heavy foods. If nausea and/or vomiting occur, drink only clear liquids until the nausea and/or vomiting subsides. Call your physician if vomiting continues.  Special Instructions/Symptoms: Your throat may feel dry or sore from the anesthesia or the breathing tube placed in your throat during surgery. If this causes discomfort, gargle with warm salt water. The discomfort should disappear within 24 hours.  If you had a scopolamine patch placed behind your ear for the management of post- operative nausea and/or vomiting:  1. The medication in the patch is effective for 72 hours, after which it should be removed.  Wrap patch in a tissue and discard in the trash. Wash hands thoroughly with soap and water. 2. You may remove the patch earlier than 72 hours if you experience unpleasant side effects which may include dry mouth, dizziness or visual disturbances. 3. Avoid touching the patch. Wash your hands with soap and water after contact with the patch.     Regional Anesthesia Blocks  1. Numbness or the inability to move the "blocked" extremity may last from 3-48 hours after placement. The length of time depends on the medication injected and your individual response to the medication. If the numbness is not going away after 48 hours, call your surgeon.  2. The extremity that is blocked will need to be protected until the numbness is  gone and the  Strength has returned. Because you cannot feel it, you will need to take extra care to avoid injury. Because it may be weak, you may have difficulty moving it or using it. You may not know what position it is in without looking at it while the block is in effect.  3. For blocks in the legs and feet, returning to weight bearing and walking needs to be done carefully. You will need to wait until the numbness is entirely gone and the strength has returned. You should be able to move your leg and foot normally before you try and bear weight or walk. You will need someone to be with you when you first try to ensure you do not fall and possibly risk injury.  4. Bruising and tenderness at the needle site are common side effects and will resolve in a few days.  5. Persistent numbness or new problems with movement should be communicated to the surgeon or the [facility] [phone] [place] [phone]).   Information for Discharge Teaching: EXPAREL (bupivacaine liposome injectable suspension)   Your surgeon or anesthesiologist gave you EXPAREL(bupivacaine) to help control your pain after surgery.   EXPAREL is a local anesthetic that provides pain relief by numbing the tissue around the surgical site.  EXPAREL is designed to release pain medication over time and can control pain for up to 72 hours.  Depending on how you respond to EXPAREL, you may require less pain medication during your recovery.  Possible side effects:  Temporary loss of sensation  or ability to move in the area where bupivacaine was injected.  Nausea, vomiting, constipation  Rarely, numbness and tingling in your mouth or lips, lightheadedness, or anxiety may occur.  Call your doctor right away if you think you may be experiencing any of these sensations, or if you have other questions regarding possible side effects.  Follow all other discharge instructions given to you by your  surgeon or nurse. Eat a healthy diet and drink plenty of water or other fluids.  If you return to the hospital for any reason within 96 hours following the administration of EXPAREL, it is important for health care providers to know that you have received this anesthetic. A teal colored band has been placed on your arm with the date, time and amount of EXPAREL you have received in order to alert and inform your health care providers. Please leave this armband in place for the full 96 hours following administration, and then you may remove the band.

## 2020-01-14 NOTE — Progress Notes (Signed)
Pt states nausea better. Pt still complains of pain 7-8 on scale. Then falls back to sleep. Dr Everardo Pacific notified of pt vomiting and complaint of pain. Instructed not to give any more narcotics to pt and discharge home once nausea resolved
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
## 2020-01-14 NOTE — Anesthesia Postprocedure Evaluation (Signed)
Anesthesia Post Note  Patient: Bailey Bailey  Procedure(s) Performed: RIGHT ELBOW ARTHROTOMY WITH CAPSULAR EXCISION , REMOVAL RETAINED HARDWARE, NEUROPLASTY AND TRANSPOSITION,ULNAR NERVE AT ELBOW (Right Elbow) ARTHROTOMY (Right Elbow) HARDWARE REMOVAL (Right Elbow)     Patient location during evaluation: Phase II Anesthesia Type: General Level of consciousness: sedated Pain management: pain level not controlled Vital Signs Assessment: post-procedure vital signs reviewed and stable Respiratory status: spontaneous breathing Cardiovascular status: stable Postop Assessment: able to ambulate Anesthetic complications: yes Anesthetic complication details: PONV   Last Vitals:  Vitals:   01/14/20 1445 01/14/20 1515  BP: 122/80 122/76  Pulse: 84 84  Resp: 18 16  Temp:    SpO2: 98% 99%    Last Pain:  Vitals:   01/14/20 1515  TempSrc:   PainSc: 8    Pain Goal: Patients Stated Pain Goal: 3 (01/14/20 1515)                 Caren Macadam

## 2020-01-15 ENCOUNTER — Encounter: Payer: Self-pay | Admitting: *Deleted

## 2020-02-04 ENCOUNTER — Other Ambulatory Visit: Payer: Self-pay

## 2020-02-04 ENCOUNTER — Ambulatory Visit: Payer: Medicaid Other | Attending: Orthopaedic Surgery | Admitting: Occupational Therapy

## 2020-02-04 DIAGNOSIS — M6281 Muscle weakness (generalized): Secondary | ICD-10-CM

## 2020-02-04 DIAGNOSIS — M25521 Pain in right elbow: Secondary | ICD-10-CM

## 2020-02-04 DIAGNOSIS — M25611 Stiffness of right shoulder, not elsewhere classified: Secondary | ICD-10-CM | POA: Diagnosis present

## 2020-02-04 DIAGNOSIS — M25621 Stiffness of right elbow, not elsewhere classified: Secondary | ICD-10-CM | POA: Diagnosis not present
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
## 2020-02-12 ENCOUNTER — Ambulatory Visit: Payer: Medicaid Other | Admitting: Occupational Therapy

## 2020-02-16 ENCOUNTER — Ambulatory Visit: Payer: Medicaid Other | Admitting: Occupational Therapy

## 2020-02-16 ENCOUNTER — Other Ambulatory Visit: Payer: Self-pay

## 2020-02-16 DIAGNOSIS — M25621 Stiffness of right elbow, not elsewhere classified: Secondary | ICD-10-CM | POA: Diagnosis not present

## 2020-02-16 DIAGNOSIS — M6281 Muscle weakness (generalized): Secondary | ICD-10-CM

## 2020-02-16 DIAGNOSIS — M25521 Pain in right elbow: Secondary | ICD-10-CM
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
## 2020-02-16 NOTE — Patient Instructions (Signed)
Flexion (Passive)    Use other hand to bend elbow, with thumb toward same shoulder. Do NOT force this motion. Hold _20___ seconds. Repeat _5___ times. Do __6__ sessions per day.  Extension (Passive)    Place thick telephone book OR double up pillows on table and rest upper arm on it. Grasp forearm with other hand and use a steady downward and outward pull to straighten elbow. Hold _20___ seconds. Repeat _5___ times. Do __6__ sessions per day.

## 2020-02-25 ENCOUNTER — Other Ambulatory Visit: Payer: Self-pay

## 2020-02-25 ENCOUNTER — Ambulatory Visit: Payer: Medicaid Other | Attending: Orthopaedic Surgery | Admitting: Occupational Therapy

## 2020-02-25 DIAGNOSIS — M25621 Stiffness of right elbow, not elsewhere classified: Secondary | ICD-10-CM | POA: Insufficient documentation

## 2020-02-25 DIAGNOSIS — M6281 Muscle weakness (generalized): Secondary | ICD-10-CM | POA: Insufficient documentation

## 2020-02-25 NOTE — Patient Instructions (Signed)
°  1. Grip Strengthening (Resistive Putty) ° ° °Squeeze putty using thumb and all fingers. °Repeat _15-20___ times. Do __2__ sessions per day. ° ° °2. Roll putty into tube on table and pinch between first two fingers and thumb x 10 reps. Do 2 sessions per day ° ° ° ° ° ° °
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
## 2020-03-03 ENCOUNTER — Ambulatory Visit: Payer: Medicaid Other | Admitting: Occupational Therapy

## 2020-03-03 ENCOUNTER — Other Ambulatory Visit: Payer: Self-pay

## 2020-03-03 DIAGNOSIS — M25621 Stiffness of right elbow, not elsewhere classified: Secondary | ICD-10-CM

## 2020-03-03 DIAGNOSIS — M6281 Muscle weakness (generalized): Secondary | ICD-10-CM
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
## 2020-03-03 NOTE — Patient Instructions (Signed)
Elbow Flexion: Resisted    With right arm straight, thumb forward, Holding __2__ pound weight, bend elbow. Return slowly. Repeat __10__ times per set. Do _1___ sets per session. Do __3__ sessions per day.  Elbow Extension: Resisted    Lie on back, _1___ pound weight in right hand, arm up, elbow bent and supported. Straighten elbow. Return slowly. Repeat _10___ times per set. Do __1__ sets per session. Do __3__ sessions per day.   Pronation / Supination (Resistive)    Hold light hammer and rotate palm up and down. Keep elbow flexed at side and wrist straight. Repeat __10__ times. Do __3__ sessions per day.

## 2020-03-16 ENCOUNTER — Ambulatory Visit: Payer: Medicaid Other | Admitting: Occupational Therapy

## 2020-03-30 ENCOUNTER — Other Ambulatory Visit: Payer: Self-pay

## 2020-03-30 ENCOUNTER — Ambulatory Visit: Payer: Medicaid Other | Attending: Orthopaedic Surgery | Admitting: Occupational Therapy

## 2020-03-30 DIAGNOSIS — M25611 Stiffness of right shoulder, not elsewhere classified: Secondary | ICD-10-CM | POA: Insufficient documentation

## 2020-03-30 DIAGNOSIS — M6281 Muscle weakness (generalized): Secondary | ICD-10-CM | POA: Insufficient documentation

## 2020-03-30 DIAGNOSIS — M25621 Stiffness of right elbow, not elsewhere classified: Secondary | ICD-10-CM | POA: Diagnosis not present
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
## 2020-04-11 ENCOUNTER — Other Ambulatory Visit: Payer: Self-pay | Admitting: Obstetrics and Gynecology

## 2020-04-11 DIAGNOSIS — N6002 Solitary cyst of left breast: Secondary | ICD-10-CM

## 2020-04-11 DIAGNOSIS — N644 Mastodynia: Secondary | ICD-10-CM

## 2020-04-13 ENCOUNTER — Ambulatory Visit: Payer: Medicaid Other | Admitting: Occupational Therapy

## 2020-04-14 ENCOUNTER — Encounter: Payer: Medicaid Other | Admitting: Occupational Therapy

## 2020-04-20 ENCOUNTER — Other Ambulatory Visit: Payer: Self-pay

## 2020-04-20 ENCOUNTER — Ambulatory Visit
Admission: RE | Admit: 2020-04-20 | Discharge: 2020-04-20 | Disposition: A | Discharge status: Home / Self Care | Payer: Medicaid Other | Source: Ambulatory Visit | Attending: Obstetrics and Gynecology | Admitting: Obstetrics and Gynecology

## 2020-04-20 ENCOUNTER — Other Ambulatory Visit: Payer: Self-pay | Admitting: Obstetrics and Gynecology

## 2020-04-20 DIAGNOSIS — N644 Mastodynia: Secondary | ICD-10-CM

## 2020-04-20 DIAGNOSIS — N6002 Solitary cyst of left breast: Secondary | ICD-10-CM

## 2020-05-01 IMAGING — CT CT OF THE RIGHT ELBOW WITHOUT CONTRAST
3 of 8 series · 14 of 33 positions shown, 17 images · non-contrast
Comparison: Right elbow x-rays and CT right elbow from yesterday.

CLINICAL DATA: Elbow fracture dislocation status post ORIF.

EXAM:
CT OF THE RIGHT ELBOW WITHOUT CONTRAST
TECHNIQUE: Multidetector CT imaging of the right right elbow was performed
according to the standard protocol. 3-dimensional CT images were
rendered by post-processing of the original CT data on an
acquisition workstation. The 3-dimensional CT images were
interpreted and findings were reported in the accompanying complete
CT report for this study.

[Series 4: lfov ext 3.0 i31s 2 (person_name) · axial · 0.48mm/px · z∈[-200,-2]mm · 10 of 78 slices shown, 13 images]
[im 6/78  soft-tissue]
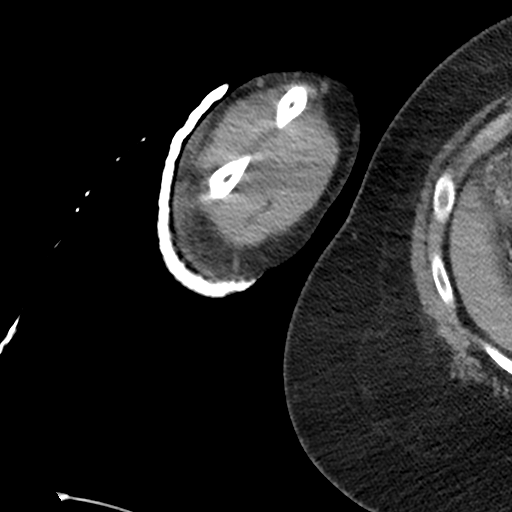
[im 6/78  bone]
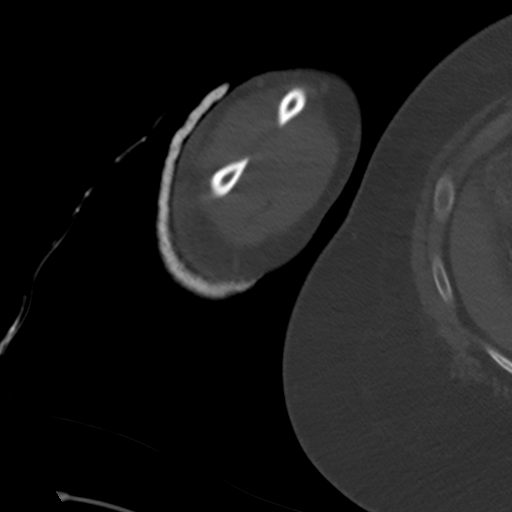
[im 12/78  bone]
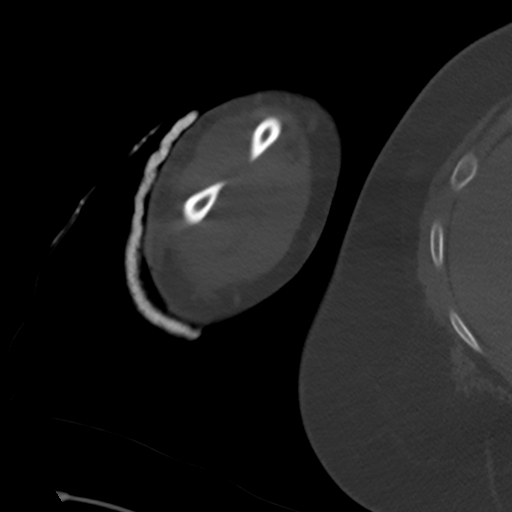
[im 24/78  bone]
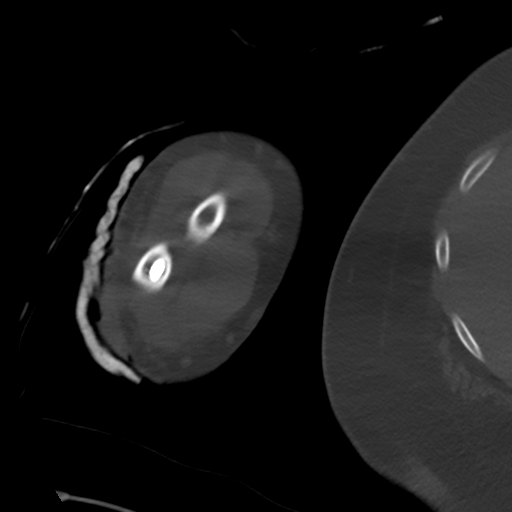
[im 30/78  bone]
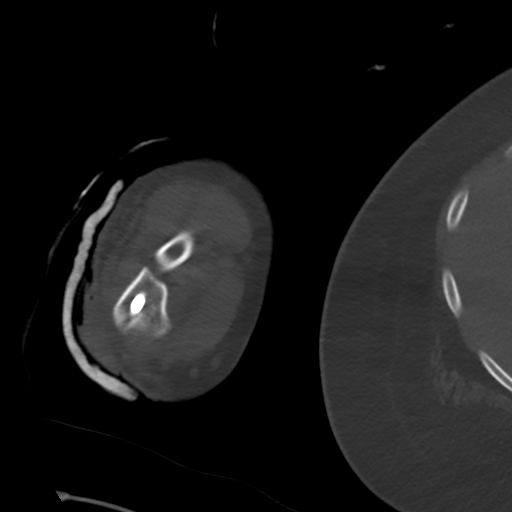
[im 36/78  soft-tissue]
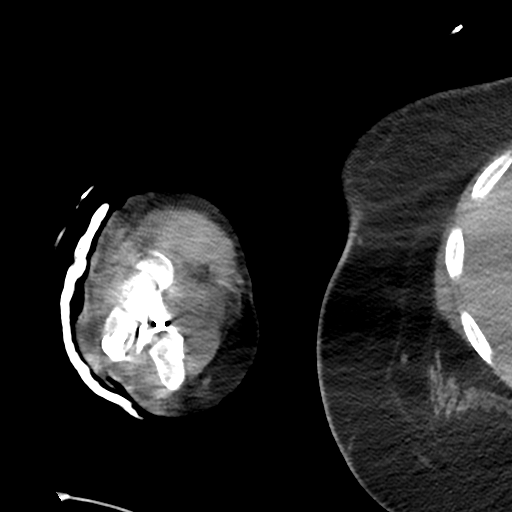
[im 36/78  bone]
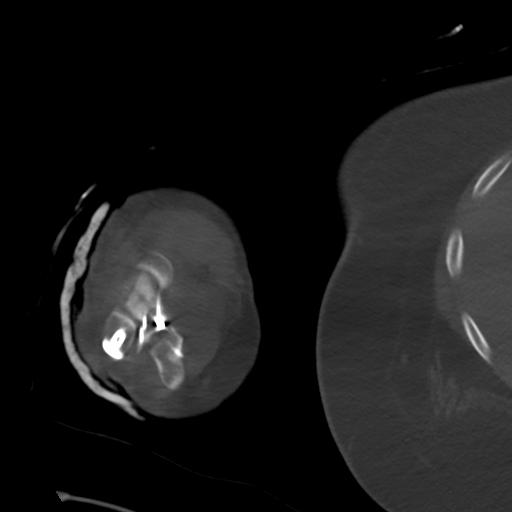
[im 42/78  bone]
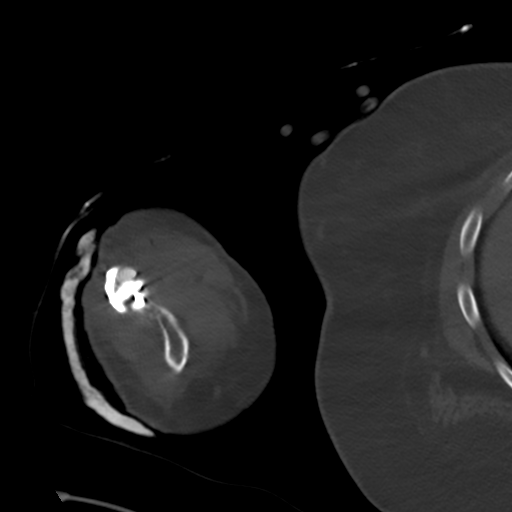
[im 48/78  bone]
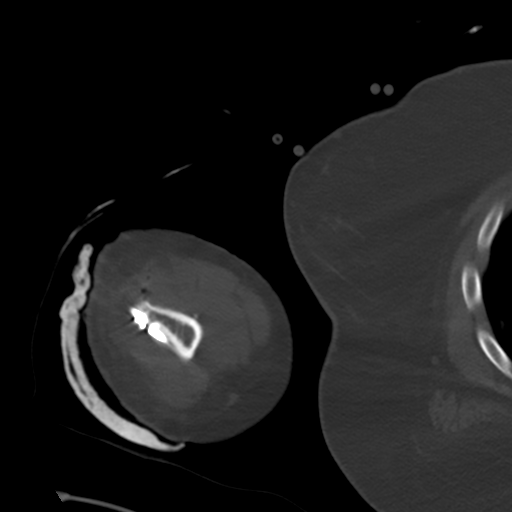
[im 60/78  bone]
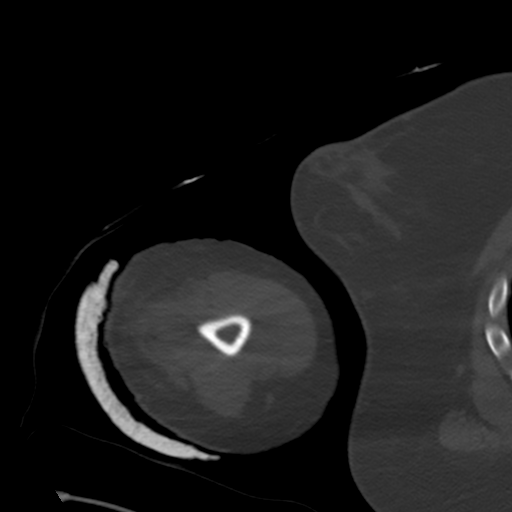
[im 66/78  soft-tissue]
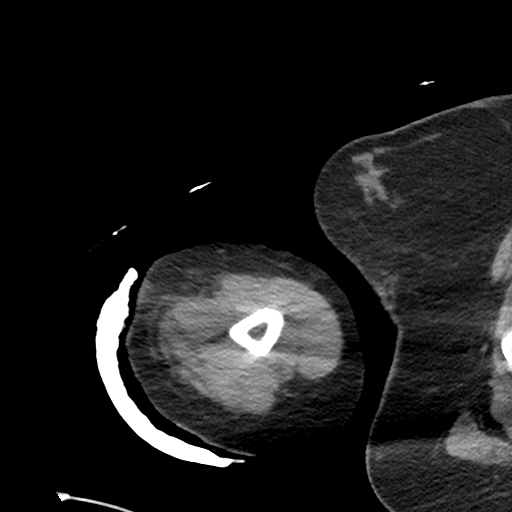
[im 66/78  bone]
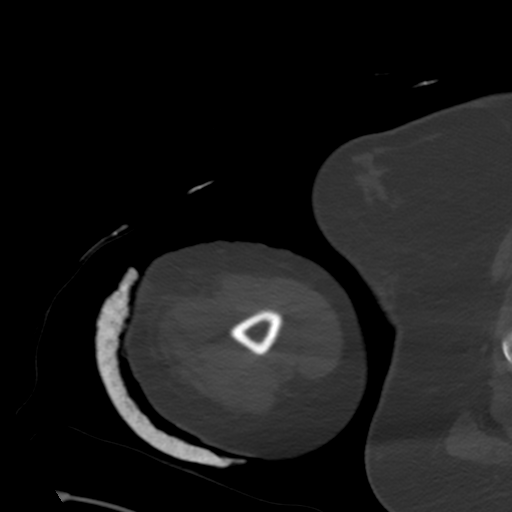
[im 72/78  bone]
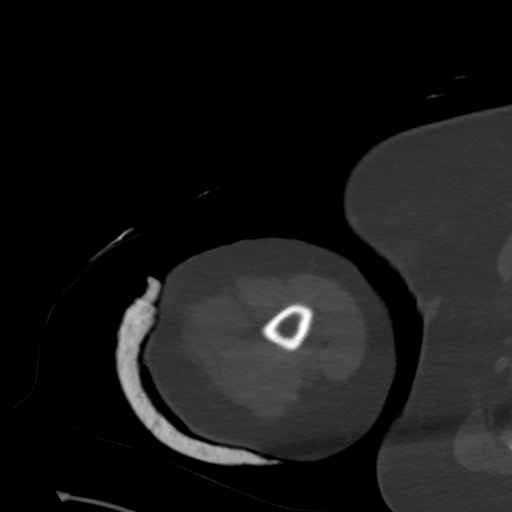

[Series 7: cor st · coronal · 0.35mm/px · 3 of 67 slices shown]
[im 14/67  bone]
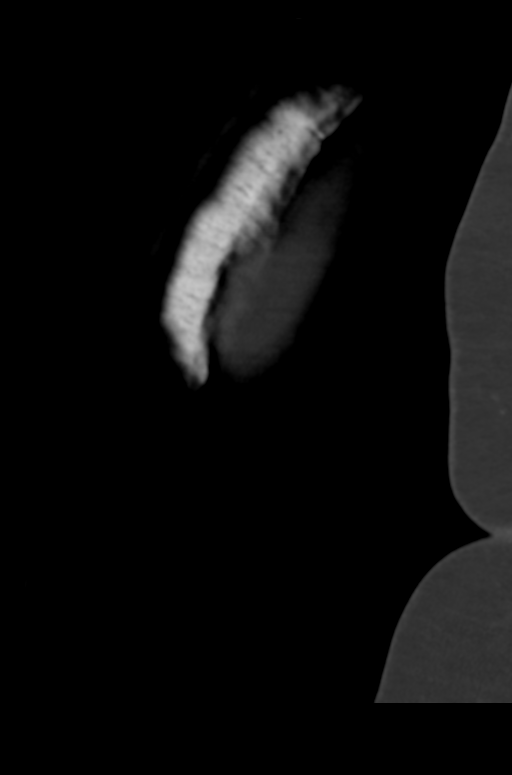
[im 27/67  bone]
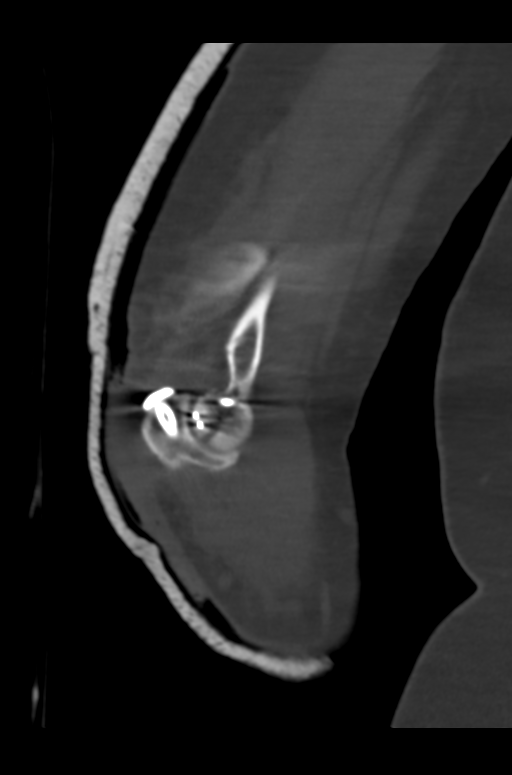
[im 40/67  bone]
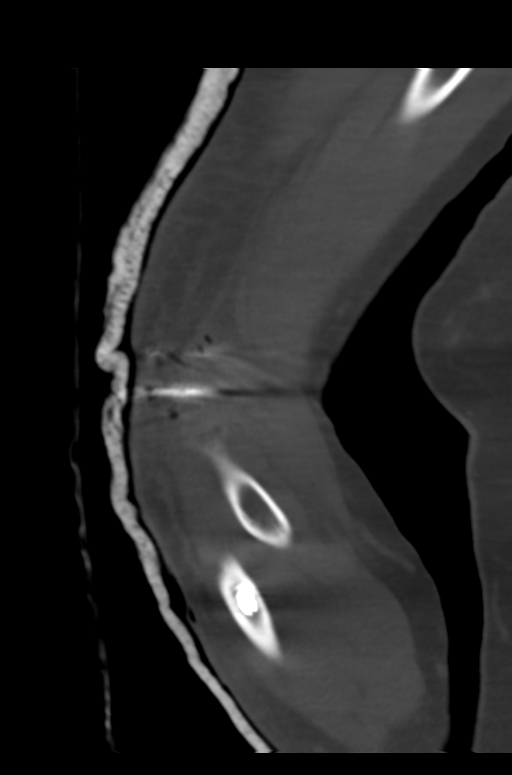

[Series 12: cor bone · sagittal · 0.51mm/px · 1 of 87 slices shown]
[im 44/87  bone]
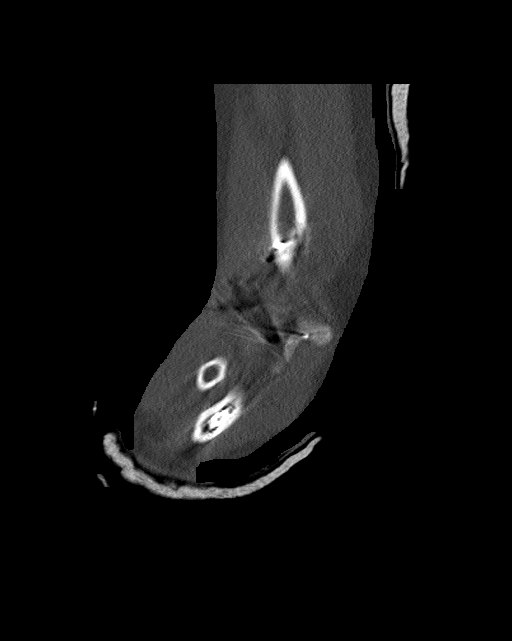

[14 of 33 positions shown; findings below may reference images not displayed]

FINDINGS: Bones/Joint/Cartilage

Postsurgical changes related to interval dorsal plate and screw
fixation of the comminuted distal humerus fracture, now in near
anatomic alignment apart from slight distraction of the medial
condyle fragment (series 3, image 42). Single screw fixation of the
olecranon and proximal ulna. No dislocation. Small joint effusion
and intra-articular air. Bone mineralization is normal.

Ligaments

Suboptimally assessed by CT.

Muscles and Tendons

Grossly intact.

Soft tissues

Expected postsurgical changes and subcutaneous emphysema along the
radial and posterior elbow. No fluid collection.
IMPRESSION: 1. Interval distal humerus ORIF now in near anatomic alignment apart
from slight distraction of the medial condyle fragment.

## 2020-05-04 ENCOUNTER — Other Ambulatory Visit: Payer: Medicaid Other

## 2020-05-11 ENCOUNTER — Ambulatory Visit
Admission: RE | Admit: 2020-05-11 | Discharge: 2020-05-11 | Disposition: A | Discharge status: Home / Self Care | Payer: Medicaid Other | Source: Ambulatory Visit | Attending: Obstetrics and Gynecology | Admitting: Obstetrics and Gynecology

## 2020-05-11 ENCOUNTER — Other Ambulatory Visit: Payer: Self-pay

## 2020-05-11 ENCOUNTER — Other Ambulatory Visit: Payer: Self-pay | Admitting: Obstetrics and Gynecology

## 2020-05-11 DIAGNOSIS — N644 Mastodynia: Secondary | ICD-10-CM

## 2020-08-15 ENCOUNTER — Other Ambulatory Visit: Payer: Medicaid Other

## 2020-09-15 ENCOUNTER — Other Ambulatory Visit: Payer: Self-pay

## 2020-09-15 ENCOUNTER — Other Ambulatory Visit: Payer: Self-pay | Admitting: Obstetrics and Gynecology

## 2020-09-15 ENCOUNTER — Ambulatory Visit
Admission: RE | Admit: 2020-09-15 | Discharge: 2020-09-15 | Disposition: A | Discharge status: Home / Self Care | Payer: Medicaid Other | Source: Ambulatory Visit | Attending: Obstetrics and Gynecology | Admitting: Obstetrics and Gynecology

## 2020-09-15 DIAGNOSIS — R599 Enlarged lymph nodes, unspecified: Secondary | ICD-10-CM

## 2020-09-15 DIAGNOSIS — N644 Mastodynia: Secondary | ICD-10-CM

## 2020-09-26 ENCOUNTER — Ambulatory Visit
Admission: RE | Admit: 2020-09-26 | Discharge: 2020-09-26 | Disposition: A | Discharge status: Home / Self Care | Payer: Medicaid Other | Source: Ambulatory Visit | Attending: Obstetrics and Gynecology | Admitting: Obstetrics and Gynecology

## 2020-09-26 ENCOUNTER — Other Ambulatory Visit: Payer: Self-pay

## 2020-09-26 DIAGNOSIS — R599 Enlarged lymph nodes, unspecified: Secondary | ICD-10-CM

## 2020-12-25 ENCOUNTER — Encounter (HOSPITAL_COMMUNITY): Payer: Self-pay | Admitting: Emergency Medicine

## 2020-12-25 ENCOUNTER — Emergency Department (HOSPITAL_COMMUNITY)
Admission: EM | Admit: 2020-12-25 | Discharge: 2020-12-26 | Disposition: A | Discharge status: Home / Self Care | Payer: Medicaid Other | Attending: Emergency Medicine | Admitting: Emergency Medicine

## 2020-12-25 DIAGNOSIS — U071 COVID-19: Secondary | ICD-10-CM | POA: Diagnosis not present

## 2020-12-25 DIAGNOSIS — R509 Fever, unspecified: Secondary | ICD-10-CM | POA: Diagnosis present

## 2020-12-25 DIAGNOSIS — Z5321 Procedure and treatment not carried out due to patient leaving prior to being seen by health care provider: Secondary | ICD-10-CM | POA: Diagnosis not present

## 2020-12-25 LAB — RESP PANEL BY RT-PCR (FLU A&B, COVID) ARPGX2
Influenza A by PCR: NEGATIVE
Influenza B by PCR: NEGATIVE
SARS Coronavirus 2 by RT PCR: POSITIVE — AB

## 2020-12-25 NOTE — ED Notes (Signed)
Patient states she is dizzy and tired and just wants to lay down. LWBS

## 2020-12-25 NOTE — ED Triage Notes (Signed)
Pt c/o body aches, fever, chills, HA x 2 days. Pt is not vaccinated

## 2020-12-26 ENCOUNTER — Telehealth (HOSPITAL_COMMUNITY): Payer: Self-pay

## 2020-12-26 ENCOUNTER — Encounter: Payer: Self-pay | Admitting: Nurse Practitioner

## 2020-12-26 ENCOUNTER — Telehealth (HOSPITAL_COMMUNITY): Payer: Self-pay | Admitting: Nurse Practitioner

## 2020-12-26 ENCOUNTER — Other Ambulatory Visit: Payer: Self-pay | Admitting: Nurse Practitioner

## 2020-12-26 ENCOUNTER — Encounter: Payer: Self-pay | Admitting: Occupational Therapy

## 2020-12-26 DIAGNOSIS — U071 COVID-19: Secondary | ICD-10-CM

## 2020-12-26 NOTE — Telephone Encounter (Signed)
Bailey Bailey called and confirmed pick-up time for 10:30 appt on 12/27/2020; pick up will be at 12:30.      ff

## 2020-12-26 NOTE — Progress Notes (Signed)
I connected by phone with Bailey Bailey on 12/26/2020 at 12:32 PM to discuss the potential use of a new treatment for mild to moderate COVID-19 viral infection in non-hospitalized patients.  This patient is a 30 y.o. female that meets the FDA criteria for Emergency Use Authorization of COVID monoclonal antibody casirivimab/imdevimab, bamlanivimab/etesevimab, or sotrovimab.  Has a (+) direct SARS-CoV-2 viral test result  Has mild or moderate COVID-19   Is NOT hospitalized due to COVID-19  Is within 10 days of symptom onset  Has at least one of the high risk factor(s) for progression to severe COVID-19 and/or hospitalization as defined in EUA.  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 FDA has authorized the emergency use for the treatment of mild to moderate COVID-19 in adults and pediatric patients with positive results of direct SARS-CoV-2 viral testing who are 33 years of age and older weighing at least 40 kg, and who are at high risk for progressing to severe COVID-19 and/or hospitalization.  2. The significant known and potential risks and benefits of COVID monoclonal antibody, and the extent to which such potential risks and benefits are unknown.  3. Information on available alternative treatments and the risks and benefits of those alternatives, including clinical trials.  4. Patients treated with COVID monoclonal antibody should continue to self-isolate and use infection control measures (e.g., wear mask, isolate, social distance, avoid sharing personal items, clean and disinfect "high touch" surfaces, and frequent handwashing) according to CDC guidelines.   5. The patient or parent/caregiver has the option to accept or refuse COVID monoclonal antibody treatment.  After reviewing this  information with the patient, the patient has agreed to receive one of the available covid 19 monoclonal antibodies and will be provided an appropriate fact sheet prior to infusion. Alinda Dooms, NP 12/26/2020 12:32 PM

## 2020-12-26 NOTE — Telephone Encounter (Signed)
Called to discuss with patient about COVID-19 symptoms and the use of one of the available treatments for those with mild to moderate Covid symptoms and at a high risk of hospitalization.     Pt appears to qualify for this infusion due to co-morbid conditions and/or a member of an at-risk group in accordance with the FDA Emergency Use Authorization.    Symptom onset: 12/24/20. Fever, weakness, chills, headache, cough, SOB, body aches, and vomiting.   Tested: 1/2 at [facility]  Vaccinated: Unvaccinated   Qualified for Infusion: Asthma, SVI, BMI over 25.     Angelia Mould

## 2020-12-27 ENCOUNTER — Other Ambulatory Visit: Payer: Self-pay | Admitting: Nurse Practitioner

## 2020-12-27 ENCOUNTER — Ambulatory Visit (HOSPITAL_COMMUNITY)
Admission: RE | Admit: 2020-12-27 | Discharge: 2020-12-27 | Disposition: A | Discharge status: Home / Self Care | Payer: Medicaid Other | Source: Ambulatory Visit | Attending: Pulmonary Disease | Admitting: Pulmonary Disease

## 2020-12-27 ENCOUNTER — Telehealth: Payer: Self-pay | Admitting: Nurse Practitioner

## 2020-12-27 DIAGNOSIS — U071 COVID-19: Secondary | ICD-10-CM | POA: Insufficient documentation

## 2020-12-27 DIAGNOSIS — R112 Nausea with vomiting, unspecified: Secondary | ICD-10-CM

## 2020-12-27 MED ORDER — EPINEPHRINE 0.3 MG/0.3ML IJ SOAJ: 0.3000 mg | Freq: Once | INTRAMUSCULAR | Status: DC | PRN

## 2020-12-27 MED ORDER — ALBUTEROL SULFATE HFA 108 (90 BASE) MCG/ACT IN AERS: 2.0000 | INHALATION_SPRAY | Freq: Once | RESPIRATORY_TRACT | Status: DC | PRN

## 2020-12-27 MED ORDER — FAMOTIDINE IN NACL 20-0.9 MG/50ML-% IV SOLN
20.0000 mg | Freq: Once | INTRAVENOUS | Status: AC | PRN
  Administered 2020-12-27: 20 mg via INTRAVENOUS
  Filled 2020-12-27: qty 50

## 2020-12-27 MED ORDER — DIPHENHYDRAMINE HCL 50 MG/ML IJ SOLN: 50.0000 mg | Freq: Once | INTRAMUSCULAR | Status: DC | PRN

## 2020-12-27 MED ORDER — ONDANSETRON HCL 4 MG/2ML IJ SOLN
4.0000 mg | Freq: Once | INTRAMUSCULAR | Status: AC
  Administered 2020-12-27: 4 mg via INTRAVENOUS
  Filled 2020-12-27: qty 2

## 2020-12-27 MED ORDER — SODIUM CHLORIDE 0.9 % IV SOLN
INTRAVENOUS | Status: DC | PRN
  Administered 2020-12-27: 1000 mL via INTRAVENOUS

## 2020-12-27 MED ORDER — SOTROVIMAB 500 MG/8ML IV SOLN
500.0000 mg | Freq: Once | INTRAVENOUS | Status: AC
  Administered 2020-12-27: 500 mg via INTRAVENOUS

## 2020-12-27 MED ORDER — METHYLPREDNISOLONE SODIUM SUCC 125 MG IJ SOLR
125.0000 mg | Freq: Once | INTRAMUSCULAR | Status: DC | PRN
  Filled 2020-12-27: qty 2

## 2020-12-27 MED ORDER — ONDANSETRON HCL 4 MG PO TABS
4.0000 mg | ORAL_TABLET | Freq: Three times a day (TID) | ORAL | 1 refills | Status: DC | PRN
Start: 2020-12-27 — End: 2021-06-09

## 2020-12-27 NOTE — Discharge Instructions (Signed)
10 Things You Can Do to Manage Your COVID-19 Symptoms at Home If you have possible or confirmed COVID-19: 1. Stay home from work and school. And stay away from other public places. If you must go out, avoid using any kind of public transportation, ridesharing, or taxis. 2. Monitor your symptoms carefully. If your symptoms get worse, call your healthcare provider immediately. 3. Get rest and stay hydrated. 4. If you have a medical appointment, call the healthcare provider ahead of time and tell them that you have or may have COVID-19. 5. For medical emergencies, call 911 and notify the dispatch personnel that you have or may have COVID-19. 6. Cover your cough and sneezes with a tissue or use the inside of your elbow. 7. Wash your hands often with soap and water for at least 20 seconds or clean your hands with an alcohol-based hand sanitizer that contains at least 60% alcohol. 8. As much as possible, stay in a specific room and away from other people in your home. Also, you should use a separate bathroom, if available. If you need to be around other people in or outside of the home, wear a mask. 9. Avoid sharing personal items with other people in your household, like dishes, towels, and bedding. 10. Clean all surfaces that are touched often, like counters, tabletops, and doorknobs. Use household cleaning sprays or wipes according to the label instructions. cdc.gov/coronavirus 06/24/2019 This information is not intended to replace advice given to you by your health care provider. Make sure you discuss any questions you have with your health care provider. Document Revised: 11/26/2019 Document Reviewed: 11/26/2019 Elsevier Patient Education  2020 Elsevier Inc. What types of side effects do monoclonal antibody drugs cause?  Common side effects  In general, the more common side effects caused by monoclonal antibody drugs include: . Allergic reactions, such as hives or itching . Flu-like signs and  symptoms, including chills, fatigue, fever, and muscle aches and pains . Nausea, vomiting . Diarrhea . Skin rashes . Low blood pressure   The CDC is recommending patients who receive monoclonal antibody treatments wait at least 90 days before being vaccinated.  Currently, there are no data on the safety and efficacy of mRNA COVID-19 vaccines in persons who received monoclonal antibodies or convalescent plasma as part of COVID-19 treatment. Based on the estimated half-life of such therapies as well as evidence suggesting that reinfection is uncommon in the 90 days after initial infection, vaccination should be deferred for at least 90 days, as a precautionary measure until additional information becomes available, to avoid interference of the antibody treatment with vaccine-induced immune responses. If you have any questions or concerns after the infusion please call the Advanced Practice Provider on call at [phone]. This number is ONLY intended for your use regarding questions or concerns about the infusion post-treatment side-effects.  Please do not provide this number to others for use. For return to work notes please contact your primary care provider.   If someone you know is interested in receiving treatment please have them call the COVID hotline at [phone].   

## 2020-12-27 NOTE — Progress Notes (Addendum)
Patient reviewed Fact Sheet for Patients, Parents, and Caregivers for Emergency Use Authorization (EUA) of Sotrovimab for the Treatment of Coronavirus. Patient also reviewed and is agreeable to the estimated cost of treatment. Patient is agreeable to proceed.   

## 2020-12-27 NOTE — Progress Notes (Signed)
Note:  Roughly 45 min post infusion, pt developed vertigo and fatigue; vs stable.  Per Enid Skeens, NP, infused famotidine 20 mg IV and NS bolus 1 L.  Pt felt much better with improved alertness with no vertigo or nausea.     Diagnosis: COVID-19  Physician: Dr. Shan Levans  Procedure: Covid Infusion Clinic Med: Sotrovimab infusion - Provided patient with sotrovimab fact sheet for patients, parents, and caregivers prior to infusion.   Complications: No immediate complications noted  Discharge: Discharged home via [employer].

## 2020-12-27 NOTE — Telephone Encounter (Signed)
[facility] On Call Note  Notified by clinic RN that patient began to experience symptoms of centralized vertigo, fatigue, and appeared slower to respond following sotrovimab infusion.   Vital signs and orthostatic vital signs stable. No evidence of arrhythmia or anaphylaxis present.   Response is listed as a possible side effect of IV infusion sotrovimab treatment. Recommend 1L NS bolus and IV famotidine with close monitoring. Discussed with RN that IV diphenhydramine and solumedrol may be given from PRN orders, if response is not effective with other treatments.   RN to update when treatment complete.

## 2021-04-06 DIAGNOSIS — L309 Dermatitis, unspecified: Secondary | ICD-10-CM | POA: Insufficient documentation

## 2021-04-06 DIAGNOSIS — L859 Epidermal thickening, unspecified: Secondary | ICD-10-CM | POA: Insufficient documentation

## 2021-04-06 DIAGNOSIS — L658 Other specified nonscarring hair loss: Secondary | ICD-10-CM | POA: Insufficient documentation

## 2021-06-09 ENCOUNTER — Encounter (HOSPITAL_COMMUNITY): Payer: Self-pay | Admitting: Obstetrics and Gynecology

## 2021-06-09 ENCOUNTER — Inpatient Hospital Stay (HOSPITAL_COMMUNITY)
Admission: AD | Admit: 2021-06-09 | Discharge: 2021-06-09 | Discharge status: Against Medical Advice | Payer: Medicaid Other | Attending: Obstetrics and Gynecology | Admitting: Obstetrics and Gynecology

## 2021-06-09 ENCOUNTER — Inpatient Hospital Stay (EMERGENCY_DEPARTMENT_HOSPITAL)
Admission: AD | Admit: 2021-06-09 | Discharge: 2021-06-09 | Disposition: A | Discharge status: Home / Self Care | Payer: Medicaid Other | Source: Home / Self Care | Attending: Obstetrics and Gynecology | Admitting: Obstetrics and Gynecology

## 2021-06-09 ENCOUNTER — Inpatient Hospital Stay (HOSPITAL_COMMUNITY): Payer: Medicaid Other

## 2021-06-09 ENCOUNTER — Other Ambulatory Visit: Payer: Self-pay

## 2021-06-09 DIAGNOSIS — F419 Anxiety disorder, unspecified: Secondary | ICD-10-CM

## 2021-06-09 DIAGNOSIS — O26899 Other specified pregnancy related conditions, unspecified trimester: Secondary | ICD-10-CM

## 2021-06-09 DIAGNOSIS — O26891 Other specified pregnancy related conditions, first trimester: Secondary | ICD-10-CM | POA: Insufficient documentation

## 2021-06-09 DIAGNOSIS — O3680X0 Pregnancy with inconclusive fetal viability, not applicable or unspecified: Secondary | ICD-10-CM | POA: Diagnosis not present

## 2021-06-09 DIAGNOSIS — O99511 Diseases of the respiratory system complicating pregnancy, first trimester: Secondary | ICD-10-CM | POA: Insufficient documentation

## 2021-06-09 DIAGNOSIS — R109 Unspecified abdominal pain: Secondary | ICD-10-CM | POA: Diagnosis not present

## 2021-06-09 DIAGNOSIS — O99341 Other mental disorders complicating pregnancy, first trimester: Secondary | ICD-10-CM

## 2021-06-09 DIAGNOSIS — F32A Depression, unspecified: Secondary | ICD-10-CM | POA: Diagnosis not present

## 2021-06-09 DIAGNOSIS — R0789 Other chest pain: Secondary | ICD-10-CM | POA: Insufficient documentation

## 2021-06-09 DIAGNOSIS — Z3A01 Less than 8 weeks gestation of pregnancy: Secondary | ICD-10-CM | POA: Insufficient documentation

## 2021-06-09 DIAGNOSIS — Z20822 Contact with and (suspected) exposure to covid-19: Secondary | ICD-10-CM | POA: Insufficient documentation

## 2021-06-09 DIAGNOSIS — J45909 Unspecified asthma, uncomplicated: Secondary | ICD-10-CM | POA: Insufficient documentation

## 2021-06-09 DIAGNOSIS — Z79899 Other long term (current) drug therapy: Secondary | ICD-10-CM | POA: Diagnosis not present

## 2021-06-09 LAB — COMPREHENSIVE METABOLIC PANEL
ALT: 13 U/L (ref 0–44)
AST: 14 U/L — ABNORMAL LOW (ref 15–41)
Albumin: 3.5 g/dL (ref 3.5–5.0)
Alkaline Phosphatase: 50 U/L (ref 38–126)
Anion gap: 6 (ref 5–15)
BUN: 7 mg/dL (ref 6–20)
CO2: 28 mmol/L (ref 22–32)
Calcium: 9.2 mg/dL (ref 8.9–10.3)
Chloride: 102 mmol/L (ref 98–111)
Creatinine, Ser: 0.59 mg/dL (ref 0.44–1.00)
GFR, Estimated: >60 mL/min (ref >60)
Glucose, Bld: 84 mg/dL (ref 70–99)
Potassium: 4.2 mmol/L (ref 3.5–5.1)
Sodium: 136 mmol/L (ref 135–145)
Total Bilirubin: 0.8 mg/dL (ref 0.3–1.2)
Total Protein: 7.6 g/dL (ref 6.5–8.1)

## 2021-06-09 LAB — URINALYSIS, ROUTINE W REFLEX MICROSCOPIC
Bilirubin Urine: NEGATIVE
Glucose, UA: NEGATIVE mg/dL
Hgb urine dipstick: NEGATIVE
Ketones, ur: NEGATIVE mg/dL
Nitrite: NEGATIVE
Protein, ur: NEGATIVE mg/dL
Specific Gravity, Urine: 1.026 (ref 1.005–1.030)
pH: 5 (ref 5.0–8.0)

## 2021-06-09 LAB — RESP PANEL BY RT-PCR (FLU A&B, COVID) ARPGX2
Influenza A by PCR: NEGATIVE
Influenza B by PCR: NEGATIVE
SARS Coronavirus 2 by RT PCR: NEGATIVE

## 2021-06-09 LAB — WET PREP, GENITAL
Clue Cells Wet Prep HPF POC: NONE SEEN
Sperm: NONE SEEN
Trich, Wet Prep: NONE SEEN
Yeast Wet Prep HPF POC: NONE SEEN

## 2021-06-09 LAB — HCG, QUANTITATIVE, PREGNANCY: hCG, Beta Chain, Quant, S: 206 m[IU]/mL — ABNORMAL HIGH (ref <5)

## 2021-06-09 LAB — TROPONIN I (HIGH SENSITIVITY): Troponin I (High Sensitivity): <2 ng/L (ref <18)

## 2021-06-09 MED ORDER — FAMOTIDINE 20 MG PO TABS: 20.0000 mg | ORAL_TABLET | Freq: Two times a day (BID) | ORAL | 1 refills | Status: DC

## 2021-06-09 MED ORDER — HYDROXYZINE PAMOATE 25 MG PO CAPS: 25.0000 mg | ORAL_CAPSULE | Freq: Three times a day (TID) | ORAL | 1 refills | Status: DC | PRN

## 2021-06-09 MED ORDER — DOXYLAMINE-PYRIDOXINE 10-10 MG PO TBEC: 2.0000 | DELAYED_RELEASE_TABLET | Freq: Two times a day (BID) | ORAL | 1 refills | Status: DC | PRN

## 2021-06-09 MED ORDER — PRENATAL VITAMIN 27-0.8 MG PO TABS: 1.0000 | ORAL_TABLET | Freq: Every day | ORAL | Status: AC

## 2021-06-09 NOTE — Discharge Instructions (Addendum)
Your labs today were all normal. Your ultrasound did not confirm an intrauterine pregnancy. Therefore, please return to Maternity Assessment Unit on Sunday, June 19 in the evening for a repeat blood test. Please be sure to keep your prenatal appointment next week. Call sooner if concern of severe abdominal pain, vaginal bleeding, safety concerns, or other concerns.  [facility]  [address], [place], [place] [phone]  [facility] [address] [place], [place] [phone] or [phone] [email or web address]  [facility], 8:30-5:00 [address] [place], [place] [phone] [email or web address]  *Bring your own interpreter at 1st visit  [facility] [address], [place], [place] [phone] [email or web address]   [facility]  [address] [place], [place] [phone]  [facility] Mon-Fri, 8am-3pm [address] [place], [place] [phone] [email or web address]  [facility] [address] [place], [place], [place] [phone]  [facility] [address], [place], [place] [phone]  [facility] [address], [place], [place] [phone] *pacientes que hablen espanol, favor comunicarse con el Sr. [place], extension 2244 o la [address], extension [place] para hacer una cita.   [facility] [address] [place]" [phone] (Habla Espanol)  [facility] [address] [place], [place], [place] [phone]  [facility] [address], [place], [place] [phone]   [facility]: [facility]aling and Empowering All Survivors)  [address],  [place], [place] [phone] [email or web address]  *Uninsured and underinsured, ages 19-64  [facility] [address] [place], [place], [place] [phone] (Habla Espanol)  [facility] [address], [place], [place] [phone] (Habla Espanol)  [facility] [address] [place], [place] [phone] Clarice Pole)  [facility] Mon-Thurs 8:30am-8:00pm/ Fri 8:30-7:00pm [address], [place], [place] (3rd floor, located at corner of [address] and [employer]) Call [phone] to schedule an appointment [email or web address]  [facility] [address] [place], [place]  [phone]  [facility]  [address], [place], [place] [phone]  Social Support [facility] ([facility] [place]) ([phone])[phone] or www.[email or web address] [address], [place], [place] [address] * Recovery support and educational programs, including recovery skills classes, support groups, and one-on-one sessions with [place] Certified Peer Support Specialists.    NAMI [facility] of the Mentally Ill) [place] NAMI Helpline: (504)[phone] * Family and Friends Support Group/  Contact Philomena Doheny at [phone] for more information * Family to [employer] and Basics Class : enroll online or email Darreld Mclean at [email or web address]  * [employer], contact [employer] at [phone] [email or web address]    24- Hour Availability:  [phone] Behavioral Health [phone] or [phone]  * Family Service of the 04-21-1995 (Domestic Violence, Rape, Victim Assistance) Line [phone]  [phone])[phone] or [phone]  * [facility] [phone]  [phone] [phone] hours)  *[facility] Mobile Crisis Unit [phone]  *1-657-903-8333 National Suicide  Hotline (248)805-7596

## 2021-06-09 NOTE — MAU Note (Signed)
RN called pt back to triage, pt states her 30 year old daughter is in the lobby by herself. RN discussed with pt that someone needed to come get her daughter so we could evaluate her complaints. Pt states that she doesn't have anyone to watch her daughter. Pt became tearful and states she will come back later. Pt left prior to discussing complaint and VS being obtained. Dr Barb Merino made aware.

## 2021-06-09 NOTE — MAU Provider Note (Signed)
Chief Complaint: Chest Pain   Event Date/Time   First Provider Initiated Contact with Patient 06/09/21 1509    SUBJECTIVE HPI: Bailey Bailey is a 30 y.o. G3P2002 at [redacted]w[redacted]d by LMP who presents to maternity admissions reporting concern of sharp and burning chest pain for the past 3 days. Onset of pain last night in the setting of lying down. Pt reports that the pain lasted 15 minutes and then resolved. However, the pain returned this morning at approximately 0100-0200. Pain improved with sitting upright, deep breathing and drinking water. Pt also reports lower abdominal cramps since this morning at approximately 0700, now improved. She reports high anxiety and depressed mood, worsened since the death of her father 1 month ago. She recently started seeing a counselor. No currently medications for anxiety/depression but prior use. Discontinued mood medication given that she "did not feel like herself". Denies SI, HI and thoughts of self harm. No personal cardiac history. Pt denies sick contacts but does report recent congestion and sore throat. She endorses whitish-yellowish vaginal discharge but denies vaginal bleeding, urinary symptoms, h/a, dizziness, vomiting or fever/chills.   Past Medical History:  Diagnosis Date   Anemia    Anxiety    panic attacks   Asthma    inhaler PRN- rarely uses   History of postpartum depression    mild- no meds   Hx of chlamydia infection 07/2013   Hx of tooth extraction 2015   Past Surgical History:  Procedure Laterality Date   ARTHROTOMY Right 01/14/2020   Procedure: ARTHROTOMY;  Surgeon: Bjorn Pippin, MD;  Location: [facility];  Service: Orthopedics;  Laterality: Right;   CESAREAN SECTION  04/16/2012   Procedure: CESAREAN SECTION;  Surgeon: Meriel Pica, MD;  Location: [facility];  Service: Gynecology;  Laterality: N/A;   CESAREAN SECTION N/A 08/03/2015   Procedure: CESAREAN SECTION;  Surgeon: Richarda Overlie, MD;  Location: [facility];  Service:  Obstetrics;  Laterality: N/A;  Repeat edc 08/10/15 nkda   HARDWARE REMOVAL Right 01/14/2020   Procedure: HARDWARE REMOVAL;  Surgeon: Bjorn Pippin, MD;  Location: [facility];  Service: Orthopedics;  Laterality: Right;   NO PAST SURGERIES     ORIF HUMERUS FRACTURE Right 04/24/2019   Procedure: OPEN REDUCTION INTERNAL FIXATION (ORIF) DISTAL HUMERUS FRACTURE;  Surgeon: Bjorn Pippin, MD;  Location: [facility];  Service: Orthopedics;  Laterality: Right;   ULNAR NERVE TRANSPOSITION Right 01/14/2020   Procedure: RIGHT ELBOW ARTHROTOMY WITH CAPSULAR EXCISION , REMOVAL RETAINED HARDWARE, NEUROPLASTY AND TRANSPOSITION,ULNAR NERVE AT ELBOW;  Surgeon: Bjorn Pippin, MD;  Location: [facility];  Service: Orthopedics;  Laterality: Right;  BLOCK EXPAREL   Social History   Socioeconomic History   Marital status: Single    Spouse name: Not on file   Number of children: Not on file   Years of education: Not on file   Highest education level: Not on file  Occupational History   Not on file  Tobacco Use   Smoking status: Never   Smokeless tobacco: Never  Substance and Sexual Activity   Alcohol use: No   Drug use: No   Sexual activity: Yes  Other Topics Concern   Not on file  Social History Narrative   Not on file   Social Determinants of Health   Financial Resource Strain: Not on file  Food Insecurity: Not on file  Transportation Needs: Not on file  Physical Activity: Not on file  Stress: Not on file  Social Connections: Not on file  Intimate Partner Violence: Not on file   No current facility-administered medications on file prior to encounter.   No current outpatient medications on file prior to encounter.   No Known Allergies  ROS:  Review of Systems  Constitutional:  Negative for chills and fever.  HENT:  Positive for congestion and sore throat.   Eyes:  Negative for photophobia and visual disturbance.  Respiratory:  Positive for chest tightness, shortness of  breath and wheezing.   Cardiovascular:  Positive for chest pain. Negative for leg swelling.  Gastrointestinal:  Positive for abdominal pain and nausea. Negative for constipation, diarrhea and vomiting.  Genitourinary:  Positive for vaginal discharge. Negative for dysuria, vaginal bleeding and vaginal pain.  Musculoskeletal:  Negative for back pain.  Neurological:  Negative for light-headedness and headaches.  Psychiatric/Behavioral:  Positive for dysphoric mood. Negative for suicidal ideas. The patient is nervous/anxious.     I have reviewed patient's Past Medical Hx, Surgical Hx, Family Hx, Social Hx, medications and allergies.   Physical Exam  Patient Vitals for the past 24 hrs:  BP Pulse Resp SpO2  06/09/21 1510 102/64 82 -- --  06/09/21 1503 118/75 89 17 100 %   Constitutional: Well-developed, well-nourished female in no acute distress. Tearful. Cardiovascular: normal rate Respiratory: normal effort GI: Abd soft, non-tender. Pos BS x 4 MS: Extremities nontender, no edema, normal ROM Neurologic: Alert and oriented x 4. Anxious appearing. Endorses depressed mood but denies SI, HI and thoughts of self harm. GU: deferred   LAB RESULTS Results for orders placed or performed during the hospital encounter of 06/09/21 (from the past 24 hour(s))  Wet prep, genital     Status: Abnormal   Collection Time: 06/09/21  3:14 PM   Specimen: Vaginal  Result Value Ref Range   Yeast Wet Prep HPF POC NONE SEEN NONE SEEN   Trich, Wet Prep NONE SEEN NONE SEEN   Clue Cells Wet Prep HPF POC NONE SEEN NONE SEEN   WBC, Wet Prep HPF POC MODERATE (A) NONE SEEN   Sperm NONE SEEN   hCG, quantitative, pregnancy     Status: Abnormal   Collection Time: 06/09/21  4:04 PM  Result Value Ref Range   hCG, Beta Chain, Quant, S 206 (H) <5 mIU/mL  Comprehensive metabolic panel     Status: Abnormal   Collection Time: 06/09/21  4:04 PM  Result Value Ref Range   Sodium 136 135 - 145 mmol/L   Potassium 4.2 3.5 -  5.1 mmol/L   Chloride 102 98 - 111 mmol/L   CO2 28 22 - 32 mmol/L   Glucose, Bld 84 70 - 99 mg/dL   BUN 7 6 - 20 mg/dL   Creatinine, Ser 8.110.59 0.44 - 1.00 mg/dL   Calcium 9.2 8.9 - 91.410.3 mg/dL   Total Protein 7.6 6.5 - 8.1 g/dL   Albumin 3.5 3.5 - 5.0 g/dL   AST 14 (L) 15 - 41 U/L   ALT 13 0 - 44 U/L   Alkaline Phosphatase 50 38 - 126 U/L   Total Bilirubin 0.8 0.3 - 1.2 mg/dL   GFR, Estimated >78>60 >29>60 mL/min   Anion gap 6 5 - 15  Resp Panel by RT-PCR (Flu A&B, Covid) Nasopharyngeal Swab     Status: None   Collection Time: 06/09/21  4:06 PM   Specimen: Nasopharyngeal Swab; Nasopharyngeal(NP) swabs in vial transport medium  Result Value Ref Range   SARS Coronavirus 2 by RT PCR NEGATIVE  NEGATIVE   Influenza A by PCR NEGATIVE NEGATIVE   Influenza B by PCR NEGATIVE NEGATIVE  Urinalysis, Routine w reflex microscopic     Status: Abnormal   Collection Time: 06/09/21  4:12 PM  Result Value Ref Range   Color, Urine YELLOW YELLOW   APPearance CLOUDY (A) CLEAR   Specific Gravity, Urine 1.026 1.005 - 1.030   pH 5.0 5.0 - 8.0   Glucose, UA NEGATIVE NEGATIVE mg/dL   Hgb urine dipstick NEGATIVE NEGATIVE   Bilirubin Urine NEGATIVE NEGATIVE   Ketones, ur NEGATIVE NEGATIVE mg/dL   Protein, ur NEGATIVE NEGATIVE mg/dL   Nitrite NEGATIVE NEGATIVE   Leukocytes,Ua LARGE (A) NEGATIVE   RBC / HPF 6-10 0 - 5 RBC/hpf   WBC, UA 0-5 0 - 5 WBC/hpf   Bacteria, UA MANY (A) NONE SEEN   Squamous Epithelial / LPF 11-20 0 - 5   Mucus PRESENT    Ca Oxalate Crys, UA PRESENT   Troponin I (High Sensitivity)     Status: None   Collection Time: 06/09/21  4:14 PM  Result Value Ref Range   Troponin I (High Sensitivity) <2 <18 ng/L       IMAGING US OB LESS THAN 14 WEEKS WITH OB TRANSVAGINAL  Result Date: 06/09/2021 CLINICAL DATA:  Lower abdominal cramps EXAM: OBSTETRIC <14 WK [place] AND TRANSVAGINAL OB US TECHNIQUE: Both transabdominal and transvaginal ultrasound examinations were performed for complete evaluation  of the gestation as well as the maternal uterus, adnexal regions, and pelvic cul-de-sac. Transvaginal technique was performed to assess early pregnancy. COMPARISON:  None. FINDINGS: Intrauterine gestational sac: None Yolk sac:  Not Visualized. Embryo:  Not Visualized. Cardiac Activity: Not Visualized. Maternal uterus/adnexae: Anteverted uterus. Endometrial thickness of 6 mm. No intrauterine gestational sac identified. Bilateral ovaries are within normal limits. No evidence of adnexal mass. Trace fluid within the cul-de-sac. IMPRESSION: No evidence of intrauterine pregnancy at this time. No adnexal mass. Recommend close clinical follow-up, short interval repeat exam, and serial beta HCG levels. Electronically Signed   By: Duanne Guess D.O.   On: 06/09/2021 17:12    MAU Management/MDM: Orders Placed This Encounter  Procedures   Wet prep, genital   Resp Panel by RT-PCR (Flu A&B, Covid) Nasopharyngeal Swab   OB Urine Culture   US OB LESS THAN 14 WEEKS WITH OB TRANSVAGINAL   Urinalysis, Routine w reflex microscopic   hCG, quantitative, pregnancy   Comprehensive metabolic panel   Airborne and Contact precautions   EKG 12-Lead   Discharge patient Discharge disposition: 01-Home or Self Care; Discharge patient date: 06/09/2021   Discharge patient Discharge disposition: 01-Home or Self Care; Discharge patient date: 06/09/2021    Meds ordered this encounter  Medications   Doxylamine-Pyridoxine 10-10 MG TBEC    Sig: Take 2 tablets by mouth 2 (two) times daily as needed.    Dispense:  20 tablet    Refill:  1   famotidine (PEPCID) 20 MG tablet    Sig: Take 1 tablet (20 mg total) by mouth 2 (two) times daily.    Dispense:  60 tablet    Refill:  1   hydrOXYzine (VISTARIL) 25 MG capsule    Sig: Take 1 capsule (25 mg total) by mouth every 8 (eight) hours as needed for anxiety.    Dispense:  30 capsule    Refill:  1   Prenatal Vit-Fe Fumarate-FA (PRENATAL VITAMIN) 27-0.8 MG TABS    Sig: Take 1 tablet  by mouth daily.    Dispense:  30 tablet   Treatments in MAU included: none. Pt discharged with strict ectopic pregnancy precautions. Also discussed strict return precautions for SI and thoughts of self harm or other concerns.  ASSESSMENT & PLAN: JUAN OLTHOFF is a 30 y.o. G3P2002 at [redacted]w[redacted]d by LMP who presents to maternity admissions reporting concern of sharp and burning chest pain for the past 3 days in addition to crampy lower abdominal pain without vaginal bleeding. Highly desired pregnancy. 1. Other chest pain: Pt reports intermittent sharp chest pain that has awakened her from sleep for the past several days. Most likely GERD given nature of symptoms and increased intake of spicy fast food in the evenings. Pt's anxiety is likely also contributing. Reassuringly, EKG and troponin within normal limits. COVID test negative. - provided reassurance and encouraged ongoing therapy with counselor - provided scripts for pepcid BID and hydroxyzine prn as noted below  2. Anxiety: Pt reports extreme stress and depressed mood, worsened over the past month given the recent death of her step-father. No current medications for anxiety or depression, but pt reports taking in the past. She recently started seeing a counselor. Denies SI, HI and thoughts of self harm. - provided script for hydroxyzine 25mg  every 8 hours prn for anxiety - provided [facility] resources and strict return precautions for safety concerns  3. Pregnancy of unknown anatomic location  Abdominal Pain in pregnancy: Wet prep within normal limits. UA notable for large LE and many bacteria. Ultrasound in MAU without evidence of ectopic pregnancy but unable to confirm intrauterine pregnancy. Beta-hcg 206. - plan to return to MAU in 48 hours for repeat beta-hcg - provided strict return precautions of signs/symptoms of ectopic pregnancy   Discharge home with plan to follow-up with initial prenatal visit as already scheduled next week. Allergies as of  06/09/2021   No Known Allergies      Medication List     STOP taking these medications    ondansetron 4 MG tablet Commonly known as: Zofran       TAKE these medications    Doxylamine-Pyridoxine 10-10 MG Tbec Take 2 tablets by mouth 2 (two) times daily as needed.   famotidine 20 MG tablet Commonly known as: PEPCID Take 1 tablet (20 mg total) by mouth 2 (two) times daily.   hydrOXYzine 25 MG capsule Commonly known as: VISTARIL Take 1 capsule (25 mg total) by mouth every 8 (eight) hours as needed for anxiety.   Prenatal Vitamin 27-0.8 MG Tabs Take 1 tablet by mouth daily.       06/11/2021, MD OB Fellow, Faculty [facility] 06/09/2021 6:54 PM

## 2021-06-09 NOTE — MAU Note (Signed)
...  Bailey Bailey is a 30 y.o. at approximately [redacted]w[redacted]d here in MAU reporting: Burning/sharp chest pains that began Wednesday night around 2200 while lying down. She states it lasted 15 minutes and ceased. She states both Thursday morning and this morning around 0100-0200 the pain returned and ceased with sitting up, breathing, drinking water, and relaxing. She also endorses occasional lower abdominal cramping that she last felt this morning around 0700. Denies current pain. No VB but states her discharge is white/yellow and has an odor.   LMP: 05/02/2021

## 2021-06-09 NOTE — MAU Note (Signed)
Patient sent home from room by Dr. Barb Merino, MD. Patient signed printed AVS. Placed in chart.

## 2021-06-11 ENCOUNTER — Inpatient Hospital Stay (HOSPITAL_COMMUNITY)
Admission: AD | Admit: 2021-06-11 | Discharge: 2021-06-11 | Disposition: A | Discharge status: Home / Self Care | Payer: Medicaid Other | Attending: Obstetrics and Gynecology | Admitting: Obstetrics and Gynecology

## 2021-06-11 ENCOUNTER — Other Ambulatory Visit: Payer: Self-pay

## 2021-06-11 DIAGNOSIS — Z3A01 Less than 8 weeks gestation of pregnancy: Secondary | ICD-10-CM | POA: Diagnosis not present

## 2021-06-11 DIAGNOSIS — E349 Endocrine disorder, unspecified: Secondary | ICD-10-CM | POA: Diagnosis not present

## 2021-06-11 DIAGNOSIS — R7989 Other specified abnormal findings of blood chemistry: Secondary | ICD-10-CM

## 2021-06-11 DIAGNOSIS — O99281 Endocrine, nutritional and metabolic diseases complicating pregnancy, first trimester: Secondary | ICD-10-CM | POA: Diagnosis present

## 2021-06-11 DIAGNOSIS — Z3689 Encounter for other specified antenatal screening: Secondary | ICD-10-CM

## 2021-06-11 LAB — CULTURE, OB URINE: Culture: >=100000 — AB

## 2021-06-11 LAB — HCG, QUANTITATIVE, PREGNANCY: hCG, Beta Chain, Quant, S: 418 m[IU]/mL — ABNORMAL HIGH (ref <5)

## 2021-06-11 NOTE — MAU Note (Signed)
Pt reports to mau for follow up hcg labs.  Pt denies any pain or bleeding today.

## 2021-06-11 NOTE — MAU Provider Note (Signed)
History   Chief Complaint:  Follow-up   Bailey Bailey is  30 y.o. [id number] Patient's last menstrual period was 05/02/2021.Marland Kitchen Patient is here for follow up of quantitative HCG and ongoing surveillance of pregnancy status. She is [redacted]w[redacted]d weeks gestation  by LMP.    Since her last visit, the patient is without new complaint. The patient reports bleeding as  none now.  She denies any pain.  General ROS:  negative  Her previous Quantitative HCG values are: Results for Bailey Bailey (MRN [id number]) as of 06/11/2021 14:04  Ref. Range 06/09/2021 16:04  HCG, Beta Chain, Quant, S Latest Ref Range: <5 mIU/mL 206 (H)   Physical Exam   Blood pressure 110/71, pulse 95, temperature 98.7 F (37.1 C), temperature source Oral, resp. rate 16, last menstrual period 05/02/2021, SpO2 100 %, unknown if currently breastfeeding.  Physical Exam Vitals and nursing note reviewed.  Constitutional:      General: She is not in acute distress.    Appearance: She is well-developed.  HENT:     Head: Normocephalic.  Eyes:     Pupils: Pupils are equal, round, and reactive to light.  Cardiovascular:     Rate and Rhythm: Normal rate and regular rhythm.  Pulmonary:     Effort: Pulmonary effort is normal. No respiratory distress.     Breath sounds: Normal breath sounds.  Abdominal:     Palpations: Abdomen is soft.     Tenderness: There is no abdominal tenderness.  Musculoskeletal:        General: Normal range of motion.     Cervical back: Normal range of motion.  Skin:    General: Skin is warm and dry.  Neurological:     Mental Status: She is alert and oriented to person, place, and time.  Psychiatric:        Behavior: Behavior normal.        Thought Content: Thought content normal.        Judgment: Judgment normal.     Labs: Results for orders placed or performed during the hospital encounter of 06/11/21 (from the past 24 hour(s))  hCG, quantitative, pregnancy   Collection Time: 06/11/21 12:54  PM  Result Value Ref Range   hCG, Beta Chain, Quant, S 418 (H) <5 mIU/mL   Assessment:   1. Elevated serum hCG   2. [redacted] weeks gestation of pregnancy     Reviewed results with patient and appropriate rise in HCG. Patient verbalized understanding and follow up plan.  Plan: -Discharge home in stable condition -First trimester precautions discussed -Patient advised to follow-up with [facility] in 2 weeks for repeat ultrasound, order placed -Patient may return to MAU as needed or if her condition were to change or worsen  Rolm Bookbinder, CNM 06/11/2021, 2:05 PM

## 2021-06-12 ENCOUNTER — Other Ambulatory Visit: Payer: Self-pay | Admitting: Advanced Practice Midwife

## 2021-06-12 DIAGNOSIS — O2341 Unspecified infection of urinary tract in pregnancy, first trimester: Secondary | ICD-10-CM

## 2021-06-12 LAB — POCT PREGNANCY, URINE: Preg Test, Ur: POSITIVE — AB

## 2021-06-12 MED ORDER — CEFADROXIL 500 MG PO CAPS: 500.0000 mg | ORAL_CAPSULE | Freq: Two times a day (BID) | ORAL | 0 refills | Status: DC

## 2021-06-12 NOTE — Progress Notes (Signed)
+   UTI on urine culture collected in MAU 06/17. Patient notified by phone. Confirmed correct pharmacy.   Clayton Bibles, MSN, CNM Certified Nurse Midwife, [facility] for [employer], [facility] 06/12/21 11:32 AM

## 2021-06-13 ENCOUNTER — Telehealth: Payer: Self-pay | Admitting: Obstetrics and Gynecology

## 2021-06-13 LAB — GC/CHLAMYDIA PROBE AMP (~~LOC~~) NOT AT ARMC
Chlamydia: NEGATIVE
Comment: NEGATIVE
Comment: NORMAL
Neisseria Gonorrhea: NEGATIVE

## 2021-06-13 NOTE — Telephone Encounter (Signed)
Called pt to discuss recent result of repeat beta-hcg. Discussed that level has doubled, which is a normal result. Pt confirmed initial prenatal appt already scheduled. No concerning symptoms at this time. Also has started taking antibiotic for recently diagnosed UTI.  Sheila Oats, MD OB Fellow, Faculty [facility] 06/13/2021 9:27 AM

## 2021-06-16 ENCOUNTER — Other Ambulatory Visit: Payer: Self-pay

## 2021-06-16 ENCOUNTER — Inpatient Hospital Stay (HOSPITAL_COMMUNITY)
Admission: AD | Admit: 2021-06-16 | Discharge: 2021-06-16 | Disposition: A | Discharge status: Home / Self Care | Payer: Medicaid Other | Attending: Obstetrics and Gynecology | Admitting: Obstetrics and Gynecology

## 2021-06-16 ENCOUNTER — Inpatient Hospital Stay (HOSPITAL_COMMUNITY): Payer: Medicaid Other

## 2021-06-16 DIAGNOSIS — O26891 Other specified pregnancy related conditions, first trimester: Secondary | ICD-10-CM | POA: Insufficient documentation

## 2021-06-16 DIAGNOSIS — R1032 Left lower quadrant pain: Secondary | ICD-10-CM

## 2021-06-16 DIAGNOSIS — O00102 Left tubal pregnancy without intrauterine pregnancy: Secondary | ICD-10-CM

## 2021-06-16 DIAGNOSIS — Z3A01 Less than 8 weeks gestation of pregnancy: Secondary | ICD-10-CM | POA: Insufficient documentation

## 2021-06-16 DIAGNOSIS — O3680X0 Pregnancy with inconclusive fetal viability, not applicable or unspecified: Secondary | ICD-10-CM

## 2021-06-16 DIAGNOSIS — R9389 Abnormal findings on diagnostic imaging of other specified body structures: Secondary | ICD-10-CM | POA: Diagnosis not present

## 2021-06-16 LAB — CBC WITH DIFFERENTIAL/PLATELET
Abs Immature Granulocytes: 0.03 10*3/uL (ref 0.00–0.07)
Basophils Absolute: 0 10*3/uL (ref 0.0–0.1)
Basophils Relative: 0 %
Eosinophils Absolute: 0.1 10*3/uL (ref 0.0–0.5)
Eosinophils Relative: 1 %
HCT: 35.9 % — ABNORMAL LOW (ref 36.0–46.0)
Hemoglobin: 11.6 g/dL — ABNORMAL LOW (ref 12.0–15.0)
Immature Granulocytes: 0 %
Lymphocytes Relative: 34 %
Lymphs Abs: 3.3 10*3/uL (ref 0.7–4.0)
MCH: 28.3 pg (ref 26.0–34.0)
MCHC: 32.3 g/dL (ref 30.0–36.0)
MCV: 87.6 fL (ref 80.0–100.0)
Monocytes Absolute: 0.5 10*3/uL (ref 0.1–1.0)
Monocytes Relative: 6 %
Neutro Abs: 5.7 10*3/uL (ref 1.7–7.7)
Neutrophils Relative %: 59 %
Platelets: 224 10*3/uL (ref 150–400)
RBC: 4.1 MIL/uL (ref 3.87–5.11)
RDW: 12.8 % (ref 11.5–15.5)
WBC: 9.7 10*3/uL (ref 4.0–10.5)
nRBC: 0 % (ref 0.0–0.2)

## 2021-06-16 LAB — COMPREHENSIVE METABOLIC PANEL
ALT: 17 U/L (ref 0–44)
AST: 17 U/L (ref 15–41)
Albumin: 3.4 g/dL — ABNORMAL LOW (ref 3.5–5.0)
Alkaline Phosphatase: 47 U/L (ref 38–126)
Anion gap: 6 (ref 5–15)
BUN: 5 mg/dL — ABNORMAL LOW (ref 6–20)
CO2: 26 mmol/L (ref 22–32)
Calcium: 8.7 mg/dL — ABNORMAL LOW (ref 8.9–10.3)
Chloride: 100 mmol/L (ref 98–111)
Creatinine, Ser: 0.64 mg/dL (ref 0.44–1.00)
GFR, Estimated: >60 mL/min (ref >60)
Glucose, Bld: 109 mg/dL — ABNORMAL HIGH (ref 70–99)
Potassium: 3.7 mmol/L (ref 3.5–5.1)
Sodium: 132 mmol/L — ABNORMAL LOW (ref 135–145)
Total Bilirubin: 1.2 mg/dL (ref 0.3–1.2)
Total Protein: 7.5 g/dL (ref 6.5–8.1)

## 2021-06-16 LAB — HCG, QUANTITATIVE, PREGNANCY: hCG, Beta Chain, Quant, S: 881 m[IU]/mL — ABNORMAL HIGH (ref <5)

## 2021-06-16 LAB — URINALYSIS, ROUTINE W REFLEX MICROSCOPIC
Bilirubin Urine: NEGATIVE
Glucose, UA: NEGATIVE mg/dL
Ketones, ur: NEGATIVE mg/dL
Nitrite: NEGATIVE
Protein, ur: NEGATIVE mg/dL
Specific Gravity, Urine: 1.005 (ref 1.005–1.030)
pH: 7 (ref 5.0–8.0)

## 2021-06-16 MED ORDER — METHOTREXATE FOR ECTOPIC PREGNANCY
50.0000 mg/m2 | Freq: Once | INTRAMUSCULAR | Status: AC
  Administered 2021-06-16: 106.5 mg via INTRAMUSCULAR
  Filled 2021-06-16: qty 4.26

## 2021-06-16 NOTE — MAU Provider Note (Signed)
Chief Complaint: Abdominal Pain   Event Date/Time   First Provider Initiated Contact with Patient 06/16/21 0521        SUBJECTIVE HPI: Bailey Bailey is a 30 y.o. G3P2002 at [redacted]w[redacted]d by LMP who presents to maternity admissions reporting Sharp LLQ pain, which has now migrated to suprapubic and radiating into rectum.  Has been seen this past week for pelvic pain.  HCGs have doubled appropriately   Is scheduled for [place] next week. . She denies vaginal bleeding, vaginal itching/burning, urinary symptoms, h/a, dizziness, n/v, or fever/chills.    Abdominal Pain This is a new problem. The current episode started today. The onset quality is sudden. The problem occurs intermittently. The problem has been gradually improving. The pain is located in the LLQ. The quality of the pain is cramping and sharp. Pain radiation: rectum. Pertinent negatives include no anorexia, constipation, diarrhea, dysuria, fever, frequency, headaches, myalgias, nausea or vomiting. Nothing aggravates the pain. The pain is relieved by Nothing. She has tried nothing for the symptoms.   RN Note: Pt reports lower left side Abd pain started last night, radiates to lower left back, 7/10, constant sharp pain, position change does not help. Denies VB, fevers, chills.  Took progesterone prescribed and pain started after.   Past Medical History:  Diagnosis Date   Anemia    Anxiety    panic attacks   Asthma    inhaler PRN- rarely uses   History of postpartum depression    mild- no meds   Hx of chlamydia infection 07/2013   Hx of tooth extraction 2015   Past Surgical History:  Procedure Laterality Date   ARTHROTOMY Right 01/14/2020   Procedure: ARTHROTOMY;  Surgeon: Bjorn Pippin, MD;  Location: [facility];  Service: Orthopedics;  Laterality: Right;   CESAREAN SECTION  04/16/2012   Procedure: CESAREAN SECTION;  Surgeon: Meriel Pica, MD;  Location: [facility];  Service: Gynecology;  Laterality: N/A;   CESAREAN  SECTION N/A 08/03/2015   Procedure: CESAREAN SECTION;  Surgeon: Richarda Overlie, MD;  Location: [facility];  Service: Obstetrics;  Laterality: N/A;  Repeat edc 08/10/15 nkda   HARDWARE REMOVAL Right 01/14/2020   Procedure: HARDWARE REMOVAL;  Surgeon: Bjorn Pippin, MD;  Location: [facility];  Service: Orthopedics;  Laterality: Right;   NO PAST SURGERIES     ORIF HUMERUS FRACTURE Right 04/24/2019   Procedure: OPEN REDUCTION INTERNAL FIXATION (ORIF) DISTAL HUMERUS FRACTURE;  Surgeon: Bjorn Pippin, MD;  Location[facility];  Service: Orthopedics;  Laterality: Right;   ULNAR NERVE TRANSPOSITION Right 01/14/2020   Procedure: RIGHT ELBOW ARTHROTOMY WITH CAPSULAR EXCISION , REMOVAL RETAINED HARDWARE, NEUROPLASTY AND TRANSPOSITION,ULNAR NERVE AT ELBOW;  Surgeon: Bjorn Pippin, MD;  Location: [facility];  Service: Orthopedics;  Laterality: Right;  BLOCK EXPAREL   Social History   Socioeconomic History   Marital status: Single    Spouse name: Not on file   Number of children: Not on file   Years of education: Not on file   Highest education level: Not on file  Occupational History   Not on file  Tobacco Use   Smoking status: Never   Smokeless tobacco: Never  Substance and Sexual Activity   Alcohol use: No   Drug use: No   Sexual activity: Yes  Other Topics Concern   Not on file  Social History Narrative   Not on file   Social Determinants of Health   Financial Resource Strain: Not  on file  Food Insecurity: Not on file  Transportation Needs: Not on file  Physical Activity: Not on file  Stress: Not on file  Social Connections: Not on file  Intimate Partner Violence: Not on file   No current facility-administered medications on file prior to encounter.   Current Outpatient Medications on File Prior to Encounter  Medication Sig Dispense Refill   cefadroxil (DURICEF) 500 MG capsule Take 1 capsule (500 mg total) by mouth 2 (two) times daily. 14 capsule 0   Prenatal  Vit-Fe Fumarate-FA (PRENATAL VITAMIN) 27-0.8 MG TABS Take 1 tablet by mouth daily. 30 tablet    Doxylamine-Pyridoxine 10-10 MG TBEC Take 2 tablets by mouth 2 (two) times daily as needed. 20 tablet 1   famotidine (PEPCID) 20 MG tablet Take 1 tablet (20 mg total) by mouth 2 (two) times daily. 60 tablet 1   hydrOXYzine (VISTARIL) 25 MG capsule Take 1 capsule (25 mg total) by mouth every 8 (eight) hours as needed for anxiety. 30 capsule 1   No Known Allergies  I have reviewed patient's Past Medical Hx, Surgical Hx, Family Hx, Social Hx, medications and allergies.   ROS:  Review of Systems  Constitutional:  Negative for fever.  Gastrointestinal:  Positive for abdominal pain. Negative for anorexia, constipation, diarrhea, nausea and vomiting.  Genitourinary:  Negative for dysuria and frequency.  Musculoskeletal:  Negative for myalgias.  Neurological:  Negative for headaches.  Review of Systems  Other systems negative   Physical Exam  Physical Exam Patient Vitals for the past 24 hrs:  BP Temp Pulse Resp SpO2 Weight  06/16/21 0517 134/89 98.1 F (36.7 C) 92 16 100 % 99.3 kg   Constitutional: Well-developed, well-nourished female in no acute distress.  Cardiovascular: normal rate Respiratory: normal effort GI: Abd soft, non-tender except slightly tender over LLQ which radiates pain to rectum.  MS: Extremities nontender, no edema, normal ROM Neurologic: Alert and oriented x 4.  GU: Neg CVAT.  PELVIC EXAM: deferred in lieu of [place]  LAB RESULTS  Prior HCGs: 06/09/21 = 206 06/11/21 = 418 06/14/21 = "800" in office  Results for orders placed or performed during the hospital encounter of 06/16/21 (from the past 24 hour(s))  hCG, quantitative, pregnancy     Status: Abnormal   Collection Time: 06/16/21  5:37 AM  Result Value Ref Range   hCG, Beta Chain, Quant, S 881 (H) <5 mIU/mL  Urinalysis, Routine w reflex microscopic Urine, Clean Catch     Status: Abnormal   Collection Time: 06/16/21   6:13 AM  Result Value Ref Range   Color, Urine YELLOW YELLOW   APPearance CLEAR CLEAR   Specific Gravity, Urine 1.005 1.005 - 1.030   pH 7.0 5.0 - 8.0   Glucose, UA NEGATIVE NEGATIVE mg/dL   Hgb urine dipstick SMALL (A) NEGATIVE   Bilirubin Urine NEGATIVE NEGATIVE   Ketones, ur NEGATIVE NEGATIVE mg/dL   Protein, ur NEGATIVE NEGATIVE mg/dL   Nitrite NEGATIVE NEGATIVE   Leukocytes,Ua SMALL (A) NEGATIVE   RBC / HPF 0-5 0 - 5 RBC/hpf   WBC, UA 0-5 0 - 5 WBC/hpf   Bacteria, UA RARE (A) NONE SEEN   Squamous Epithelial / LPF 0-5 0 - 5   IMAGING US OB Transvaginal  Result Date: 06/16/2021 CLINICAL DATA:  Left lower quadrant pain. Pregnancy of unknown location. Quantitative beta HCG from today is still in progress. EXAM: TRANSVAGINAL OB ULTRASOUND TECHNIQUE: Transvaginal ultrasound was performed for complete evaluation of the gestation as well as  the maternal uterus, adnexal regions, and pelvic cul-de-sac. COMPARISON:  Seven days ago FINDINGS: No clear intrauterine sac. Minimal fluid is seen at the fundic endometrium without intra decidual or clear sac-like appearance, also noted on prior. There is a vague nodular structure in the left adnexa separate from the ovary which itself measures normal dimensions 22 x 14 mm. The extra ovarian nodular structure measures approximately 3 cm. No definite peritoneal fluid IMPRESSION: Findings are concerning but not definite for ectopic on the left where there is an extra ovarian nodular structure. No visible intrauterine sac but need correlation with pending beta HCG. Electronically Signed   By: Marnee Spring M.D.   On: 06/16/2021 06:29   US OB LESS THAN 14 WEEKS WITH OB TRANSVAGINAL  Result Date: 06/09/2021 CLINICAL DATA:  Lower abdominal cramps EXAM: OBSTETRIC <14 WK [place] AND TRANSVAGINAL OB US TECHNIQUE: Both transabdominal and transvaginal ultrasound examinations were performed for complete evaluation of the gestation as well as the maternal uterus, adnexal  regions, and pelvic cul-de-sac. Transvaginal technique was performed to assess early pregnancy. COMPARISON:  None. FINDINGS: Intrauterine gestational sac: None Yolk sac:  Not Visualized. Embryo:  Not Visualized. Cardiac Activity: Not Visualized. Maternal uterus/adnexae: Anteverted uterus. Endometrial thickness of 6 mm. No intrauterine gestational sac identified. Bilateral ovaries are within normal limits. No evidence of adnexal mass. Trace fluid within the cul-de-sac. IMPRESSION: No evidence of intrauterine pregnancy at this time. No adnexal mass. Recommend close clinical follow-up, short interval repeat exam, and serial beta HCG levels. Electronically Signed   By: Duanne Guess D.O.   On: 06/09/2021 17:12     MAU Management/MDM: Ordered repeat HCG to assess for rise.  Marland Kitchen   Pelvic exam and cultures done last week. Will check repeat Ultrasound to rule out ectopic. Discussed we do not expect to see fetus yet.  Consult Drs Donavan Foil and Elon Spanner with presentation, exam findings, and results.   Dr Elon Spanner will come in and assess the patient and view the ultrasound  This pain can represent a normal pregnancy with bleeding, spontaneous abortion or even an ectopic which can be life-threatening.  The process as listed above helps to determine which of these is present.    ASSESSMENT 1. LLQ pain   2. Pregnancy of unknown anatomic location     PLAN Dr Vincente Poli Assumed care of this patient  Wynelle Bourgeois CNM, MSN Certified Nurse-Midwife 06/16/2021  5:27 AM

## 2021-06-16 NOTE — H&P (Signed)
30 year old G 3 P 2002 at 6 w 3 days presents for LLQ pain. Pain now 3/ 10. Had HCG yesterday 800  Ultrasound 3 cm left adnexal mass Suspicious for ectopic. HCG 881  BP 134/89   Pulse 92   Temp 98.1 F (36.7 C)   Resp 16   Wt 99.3 kg   LMP 05/02/2021   SpO2 100%   BMI 36.44 kg/m  Abdomen is soft and non tender No rebound or guarding  Results for orders placed or performed during the hospital encounter of 06/16/21 (from the past 24 hour(s))  hCG, quantitative, pregnancy     Status: Abnormal   Collection Time: 06/16/21  5:37 AM  Result Value Ref Range   hCG, Beta Chain, Quant, S 881 (H) <5 mIU/mL  Urinalysis, Routine w reflex microscopic Urine, Clean Catch     Status: Abnormal   Collection Time: 06/16/21  6:13 AM  Result Value Ref Range   Color, Urine YELLOW YELLOW   APPearance CLEAR CLEAR   Specific Gravity, Urine 1.005 1.005 - 1.030   pH 7.0 5.0 - 8.0   Glucose, UA NEGATIVE NEGATIVE mg/dL   Hgb urine dipstick SMALL (A) NEGATIVE   Bilirubin Urine NEGATIVE NEGATIVE   Ketones, ur NEGATIVE NEGATIVE mg/dL   Protein, ur NEGATIVE NEGATIVE mg/dL   Nitrite NEGATIVE NEGATIVE   Leukocytes,Ua SMALL (A) NEGATIVE   RBC / HPF 0-5 0 - 5 RBC/hpf   WBC, UA 0-5 0 - 5 WBC/hpf   Bacteria, UA RARE (A) NONE SEEN   Squamous Epithelial / LPF 0-5 0 - 5   Impression: Probable Left Ectopic pregnancy with LLQ mass and abnormally rising HCG and adnexal pain  PLAN: Patient extensively counseled about options - Methotrexate versus Surgery  Patient is advised of risk of rupture of ectopic pregnancy if left untreated  Risks of failure of methotrexate discussed with patient  Patient agrees to methotrexate  Patient given ectopic precautions  Patient to follow up on Monday in our office for HCG

## 2021-06-16 NOTE — MAU Note (Addendum)
Pt reports lower left side Abd pain started last night, radiates to lower left back, 7/10, constant sharp pain, position change does not help. Denies VB, fevers, chills. Feels pain and pressure in bottom  LBM: 06/09/2021  Took progesterone prescribed last night and pain started after.

## 2021-06-20 ENCOUNTER — Ambulatory Visit: Admission: RE | Admit: 2021-06-20 | Payer: Medicaid Other | Source: Ambulatory Visit

## 2021-11-26 ENCOUNTER — Encounter (HOSPITAL_COMMUNITY): Payer: Self-pay | Admitting: Obstetrics & Gynecology

## 2021-11-26 ENCOUNTER — Inpatient Hospital Stay (HOSPITAL_COMMUNITY)
Admission: AD | Admit: 2021-11-26 | Discharge: 2021-11-26 | Disposition: A | Discharge status: Home / Self Care | Payer: Medicaid Other | Attending: Obstetrics & Gynecology | Admitting: Obstetrics & Gynecology

## 2021-11-26 ENCOUNTER — Other Ambulatory Visit: Payer: Self-pay

## 2021-11-26 ENCOUNTER — Inpatient Hospital Stay (HOSPITAL_COMMUNITY): Payer: Medicaid Other

## 2021-11-26 DIAGNOSIS — Z6790 Unspecified blood type, Rh positive: Secondary | ICD-10-CM | POA: Diagnosis not present

## 2021-11-26 DIAGNOSIS — M25562 Pain in left knee: Secondary | ICD-10-CM | POA: Insufficient documentation

## 2021-11-26 DIAGNOSIS — M79604 Pain in right leg: Secondary | ICD-10-CM | POA: Insufficient documentation

## 2021-11-26 DIAGNOSIS — N83201 Unspecified ovarian cyst, right side: Secondary | ICD-10-CM | POA: Insufficient documentation

## 2021-11-26 DIAGNOSIS — O3680X0 Pregnancy with inconclusive fetal viability, not applicable or unspecified: Secondary | ICD-10-CM | POA: Insufficient documentation

## 2021-11-26 DIAGNOSIS — Z3A00 Weeks of gestation of pregnancy not specified: Secondary | ICD-10-CM | POA: Insufficient documentation

## 2021-11-26 DIAGNOSIS — O26891 Other specified pregnancy related conditions, first trimester: Secondary | ICD-10-CM | POA: Diagnosis present

## 2021-11-26 DIAGNOSIS — M79652 Pain in left thigh: Secondary | ICD-10-CM | POA: Diagnosis not present

## 2021-11-26 DIAGNOSIS — R109 Unspecified abdominal pain: Secondary | ICD-10-CM | POA: Diagnosis not present

## 2021-11-26 DIAGNOSIS — O26899 Other specified pregnancy related conditions, unspecified trimester: Secondary | ICD-10-CM

## 2021-11-26 LAB — COMPREHENSIVE METABOLIC PANEL
ALT: 22 U/L (ref 0–44)
AST: 21 U/L (ref 15–41)
Albumin: 3.6 g/dL (ref 3.5–5.0)
Alkaline Phosphatase: 47 U/L (ref 38–126)
Anion gap: 8 (ref 5–15)
BUN: <5 mg/dL — ABNORMAL LOW (ref 6–20)
CO2: 29 mmol/L (ref 22–32)
Calcium: 9.1 mg/dL (ref 8.9–10.3)
Chloride: 100 mmol/L (ref 98–111)
Creatinine, Ser: 0.63 mg/dL (ref 0.44–1.00)
GFR, Estimated: >60 mL/min (ref >60)
Glucose, Bld: 89 mg/dL (ref 70–99)
Potassium: 3.6 mmol/L (ref 3.5–5.1)
Sodium: 137 mmol/L (ref 135–145)
Total Bilirubin: 0.6 mg/dL (ref 0.3–1.2)
Total Protein: 7.8 g/dL (ref 6.5–8.1)

## 2021-11-26 LAB — CBC WITH DIFFERENTIAL/PLATELET
Abs Immature Granulocytes: 0.03 10*3/uL (ref 0.00–0.07)
Basophils Absolute: 0 10*3/uL (ref 0.0–0.1)
Basophils Relative: 0 %
Eosinophils Absolute: 0.1 10*3/uL (ref 0.0–0.5)
Eosinophils Relative: 1 %
HCT: 39.1 % (ref 36.0–46.0)
Hemoglobin: 12.5 g/dL (ref 12.0–15.0)
Immature Granulocytes: 0 %
Lymphocytes Relative: 35 %
Lymphs Abs: 4.1 10*3/uL — ABNORMAL HIGH (ref 0.7–4.0)
MCH: 28.1 pg (ref 26.0–34.0)
MCHC: 32 g/dL (ref 30.0–36.0)
MCV: 87.9 fL (ref 80.0–100.0)
Monocytes Absolute: 0.6 10*3/uL (ref 0.1–1.0)
Monocytes Relative: 5 %
Neutro Abs: 6.8 10*3/uL (ref 1.7–7.7)
Neutrophils Relative %: 59 %
Platelets: 310 10*3/uL (ref 150–400)
RBC: 4.45 MIL/uL (ref 3.87–5.11)
RDW: 13.2 % (ref 11.5–15.5)
WBC: 11.7 10*3/uL — ABNORMAL HIGH (ref 4.0–10.5)
nRBC: 0 % (ref 0.0–0.2)

## 2021-11-26 LAB — URINALYSIS, ROUTINE W REFLEX MICROSCOPIC
Bilirubin Urine: NEGATIVE
Glucose, UA: NEGATIVE mg/dL
Hgb urine dipstick: NEGATIVE
Ketones, ur: NEGATIVE mg/dL
Nitrite: NEGATIVE
Protein, ur: NEGATIVE mg/dL
Specific Gravity, Urine: 1.019 (ref 1.005–1.030)
pH: 7 (ref 5.0–8.0)

## 2021-11-26 LAB — ABO/RH: ABO/RH(D): A POS

## 2021-11-26 LAB — POCT PREGNANCY, URINE: Preg Test, Ur: POSITIVE — AB

## 2021-11-26 LAB — HCG, QUANTITATIVE, PREGNANCY: hCG, Beta Chain, Quant, S: 38 m[IU]/mL — ABNORMAL HIGH (ref <5)

## 2021-11-26 NOTE — MAU Provider Note (Signed)
History     CSN: [id number]  Arrival date and time: 11/26/21 1806   Event Date/Time   First Provider Initiated Contact with Patient 11/26/21 1928      Chief Complaint  Patient presents with   Abdominal Pain   Possible Pregnancy   HPI  [name] is a 30 y.o. female [phone] here in MAU with left leg pain. The pain starts at the top of her thigh and radiates down to her left knee. The pain is there every time she becomes pregnant.  The pain has been there for months. At times it happens in both legs, however the pain is worse when pregnant in her left leg.   Of note, the patient denies ever reporting abdominal pain to the registration of nurse. She reports leg pain only that has been there chronically. She has no bleeding. No pregnancy complaints today.   OB History   This patient's OB History needs to be verified. Open OB History to review and resolve any issues.  Gravida  4   Para  2   Term  2   Preterm  0   AB  0   Living  2      SAB  0   IAB  0   Ectopic  0   Multiple  0   Live Births  2           Past Medical History:  Diagnosis Date   Anemia    Anxiety    panic attacks   Asthma    inhaler PRN- rarely uses   History of postpartum depression    mild- no meds   Hx of chlamydia infection 07/2013   Hx of tooth extraction 2015    Past Surgical History:  Procedure Laterality Date   ARTHROTOMY Right 01/14/2020   Procedure: ARTHROTOMY;  Surgeon: Bjorn Pippin, MD;  Location: [facility];  Service: Orthopedics;  Laterality: Right;   CESAREAN SECTION  04/16/2012   Procedure: CESAREAN SECTION;  Surgeon: Meriel Pica, MD;  Location: [facility];  Service: Gynecology;  Laterality: N/A;   CESAREAN SECTION N/A 08/03/2015   Procedure: CESAREAN SECTION;  Surgeon: Richarda Overlie, MD;  Location: [facility];  Service: Obstetrics;  Laterality: N/A;  Repeat edc 08/10/15 nkda   HARDWARE REMOVAL Right 01/14/2020   Procedure: HARDWARE REMOVAL;   Surgeon: Bjorn Pippin, MD;  Location: [facility];  Service: Orthopedics;  Laterality: Right;   NO PAST SURGERIES     ORIF HUMERUS FRACTURE Right 04/24/2019   Procedure: OPEN REDUCTION INTERNAL FIXATION (ORIF) DISTAL HUMERUS FRACTURE;  Surgeon: Bjorn Pippin, MD;  Location: [facility];  Service: Orthopedics;  Laterality: Right;   ULNAR NERVE TRANSPOSITION Right 01/14/2020   Procedure: RIGHT ELBOW ARTHROTOMY WITH CAPSULAR EXCISION , REMOVAL RETAINED HARDWARE, NEUROPLASTY AND TRANSPOSITION,ULNAR NERVE AT ELBOW;  Surgeon: Bjorn Pippin, MD;  Location: [facility];  Service: Orthopedics;  Laterality: Right;  BLOCK EXPAREL    Family History  Problem Relation Age of Onset   Kidney disease Mother    Migraines Mother    Diabetes Father    Breast cancer Father    Heart disease Maternal Grandmother 45       CHF    Social History   Tobacco Use   Smoking status: Never   Smokeless tobacco: Never  Substance Use Topics   Alcohol use: No   Drug use: No    Allergies: No Known Allergies  Medications Prior  to Admission  Medication Sig Dispense Refill Last Dose   Prenatal Vit-Fe Fumarate-FA (PRENATAL VITAMIN) 27-0.8 MG TABS Take 1 tablet by mouth daily. 30 tablet  11/26/2021   progesterone (PROMETRIUM) 200 MG capsule Take 200 mg by mouth daily.   11/25/2021   cefadroxil (DURICEF) 500 MG capsule Take 1 capsule (500 mg total) by mouth 2 (two) times daily. 14 capsule 0    Doxylamine-Pyridoxine 10-10 MG TBEC Take 2 tablets by mouth 2 (two) times daily as needed. 20 tablet 1    famotidine (PEPCID) 20 MG tablet Take 1 tablet (20 mg total) by mouth 2 (two) times daily. 60 tablet 1    hydrOXYzine (VISTARIL) 25 MG capsule Take 1 capsule (25 mg total) by mouth every 8 (eight) hours as needed for anxiety. 30 capsule 1    Results for orders placed or performed during the hospital encounter of 11/26/21 (from the past 48 hour(s))  Pregnancy, urine POC     Status: Abnormal   Collection Time:  11/26/21  6:44 PM  Result Value Ref Range   Preg Test, Ur POSITIVE (A) NEGATIVE    Comment:        THE SENSITIVITY OF THIS METHODOLOGY IS >24 mIU/mL   Urinalysis, Routine w reflex microscopic Urine, Clean Catch     Status: Abnormal   Collection Time: 11/26/21  6:45 PM  Result Value Ref Range   Color, Urine YELLOW YELLOW   APPearance CLOUDY (A) CLEAR   Specific Gravity, Urine 1.019 1.005 - 1.030   pH 7.0 5.0 - 8.0   Glucose, UA NEGATIVE NEGATIVE mg/dL   Hgb urine dipstick NEGATIVE NEGATIVE   Bilirubin Urine NEGATIVE NEGATIVE   Ketones, ur NEGATIVE NEGATIVE mg/dL   Protein, ur NEGATIVE NEGATIVE mg/dL   Nitrite NEGATIVE NEGATIVE   Leukocytes,Ua MODERATE (A) NEGATIVE   RBC / HPF 6-10 0 - 5 RBC/hpf   WBC, UA 0-5 0 - 5 WBC/hpf   Bacteria, UA RARE (A) NONE SEEN   Squamous Epithelial / LPF 11-20 0 - 5   Mucus PRESENT     Comment: Performed at [facility], [address], [place], [place] [address]  CBC with Differential/Platelet     Status: Abnormal   Collection Time: 11/26/21  7:36 PM  Result Value Ref Range   WBC 11.7 (H) 4.0 - 10.5 K/uL   RBC 4.45 3.87 - 5.11 MIL/uL   Hemoglobin 12.5 12.0 - 15.0 g/dL   HCT 25.9 56.3 - 87.5 %   MCV 87.9 80.0 - 100.0 fL   MCH 28.1 26.0 - 34.0 pg   MCHC 32.0 30.0 - 36.0 g/dL   RDW 64.3 32.9 - 51.8 %   Platelets 310 150 - 400 K/uL   nRBC 0.0 0.0 - 0.2 %   Neutrophils Relative % 59 %   Neutro Abs 6.8 1.7 - 7.7 K/uL   Lymphocytes Relative 35 %   Lymphs Abs 4.1 (H) 0.7 - 4.0 K/uL   Monocytes Relative 5 %   Monocytes Absolute 0.6 0.1 - 1.0 K/uL   Eosinophils Relative 1 %   Eosinophils Absolute 0.1 0.0 - 0.5 K/uL   Basophils Relative 0 %   Basophils Absolute 0.0 0.0 - 0.1 K/uL   Immature Granulocytes 0 %   Abs Immature Granulocytes 0.03 0.00 - 0.07 K/uL    Comment: Performed at [facility], [address] [place], [place] [address]  Comprehensive metabolic panel     Status: Abnormal   Collection Time: 11/26/21  7:36 PM  Result  Value Ref Range   Sodium 137 135 - 145 mmol/L   Potassium 3.6 3.5 - 5.1 mmol/L   Chloride 100 98 - 111 mmol/L   CO2 29 22 - 32 mmol/L   Glucose, Bld 89 70 - 99 mg/dL    Comment: Glucose reference range applies only to samples taken after fasting for at least 8 hours.   BUN <5 (L) 6 - 20 mg/dL   Creatinine, Ser 9.56 0.44 - 1.00 mg/dL   Calcium 9.1 8.9 - 21.3 mg/dL   Total Protein 7.8 6.5 - 8.1 g/dL   Albumin 3.6 3.5 - 5.0 g/dL   AST 21 15 - 41 U/L   ALT 22 0 - 44 U/L   Alkaline Phosphatase 47 38 - 126 U/L   Total Bilirubin 0.6 0.3 - 1.2 mg/dL   GFR, Estimated >08 >65 mL/min    Comment: (NOTE) Calculated using the CKD-EPI Creatinine Equation (2021)    Anion gap 8 5 - 15    Comment: Performed at [facility], [address], [place], [place] [address]  ABO/Rh     Status: None   Collection Time: 11/26/21  7:36 PM  Result Value Ref Range   ABO/RH(D)      A POS Performed at [facility], [address] [place], [place] [address]   hCG, quantitative, pregnancy     Status: Abnormal   Collection Time: 11/26/21  7:36 PM  Result Value Ref Range   hCG, Beta Chain, Quant, S 38 (H) <5 mIU/mL    Comment:          GEST. AGE      CONC.  (mIU/mL)   <=1 WEEK        5 - 50     2 WEEKS       50 - 500     3 WEEKS       100 - 10,000     4 WEEKS     1,000 - 30,000     5 WEEKS     3,500 - 115,000   6-8 WEEKS     12,000 - 270,000    12 WEEKS     15,000 - 220,000        FEMALE AND NON-PREGNANT FEMALE:     LESS THAN 5 mIU/mL Performed at [facility], [address], [place], [place] [address]     US OB LESS THAN 14 WEEKS WITH [place] TRANSVAGINAL  Result Date: 11/26/2021 CLINICAL DATA:  Abdominal pain. Positive urine pregnancy test. EXAM: OBSTETRIC <14 WK [place] AND TRANSVAGINAL OB US TECHNIQUE: Both transabdominal and transvaginal ultrasound examinations were performed for complete evaluation of the gestation as well as the maternal uterus, adnexal regions, and pelvic cul-de-sac.  Transvaginal technique was performed to assess early pregnancy. COMPARISON:  None. FINDINGS: Intrauterine gestational sac: None Yolk sac:  Visualized. Embryo:  Not Visualized. Maternal uterus/adnexae: Heterogeneous appearance of the endometrium which measures 8 mm in thickness. The right ovary measures 4.3 x 2.5 x 2.3 cm and contains a physiologic cyst, probably corpus luteum. Left ovary measures 3.0 x 2.0 x 2.3 cm and is normal. IMPRESSION: Heterogeneous appearance of the endometrium without intrauterine gestational sac seen. Please correlate clinically. Normal appearance of the ovaries. Electronically Signed   By: Ted Mcalpine M.D.   On: 11/26/2021 20:43     Review of Systems  Musculoskeletal:  Positive for myalgias. Negative for arthralgias, back pain, gait problem, joint  swelling, neck pain and neck stiffness.  Skin:  Negative for color change.  Neurological:  Negative for syncope and weakness.  Physical Exam   Blood pressure 115/64, pulse 78, temperature 98.6 F (37 C), resp. rate 16, last menstrual period 10/01/2021, SpO2 100 %, unknown if currently breastfeeding.  Physical Exam Vitals and nursing note reviewed.  Constitutional:      General: She is not in acute distress.    Appearance: She is well-developed. She is not ill-appearing, toxic-appearing or diaphoretic.  HENT:     Head: Normocephalic.  Genitourinary:    Rectum: Normal.  Musculoskeletal:     Left upper leg: No swelling, edema, deformity, lacerations, tenderness or bony tenderness.       Legs:  Skin:    General: Skin is warm.  Neurological:     Mental Status: She is alert.   MAU Course  Procedures  MDM  HIV, CBC, Hcg, ABO US OB transvaginal   Discussed patient with on Call Dr. Langston Masker who will arrange for follow up in the office this week for Quant.   Assessment and Plan   A:  1. Pregnancy of unknown anatomic location   2. Abdominal pain in pregnancy   3. Blood type, Rh positive       P:  Discharge home Return to MAU for OB complaints F/u with [facility] May need PT   Duane Lope, NP 11/27/2021 12:52 PM

## 2021-11-26 NOTE — MAU Note (Addendum)
Pt reports pain in her lower abd and pain radiates down both legs. Denies dysuria , denies bleeding. Reports she has not felt the baby movement at all during the pregnancy. Pt reports she is not 29 weeks pregnancy as chart indicates. States she miscarried that pregnancy. Reports she had a positive home preg test yesterday.

## 2021-11-28 LAB — CULTURE, OB URINE: Culture: <10000 — AB

## 2022-06-27 ENCOUNTER — Inpatient Hospital Stay (HOSPITAL_COMMUNITY): Payer: Medicaid Other

## 2022-06-27 ENCOUNTER — Encounter (HOSPITAL_COMMUNITY): Payer: Self-pay | Admitting: Obstetrics & Gynecology

## 2022-06-27 ENCOUNTER — Inpatient Hospital Stay (HOSPITAL_COMMUNITY)
Admission: AD | Admit: 2022-06-27 | Discharge: 2022-06-27 | Disposition: A | Discharge status: Home / Self Care | Payer: Medicaid Other | Attending: Obstetrics and Gynecology | Admitting: Obstetrics and Gynecology

## 2022-06-27 DIAGNOSIS — O26891 Other specified pregnancy related conditions, first trimester: Secondary | ICD-10-CM | POA: Diagnosis present

## 2022-06-27 DIAGNOSIS — O210 Mild hyperemesis gravidarum: Secondary | ICD-10-CM | POA: Diagnosis not present

## 2022-06-27 DIAGNOSIS — M7918 Myalgia, other site: Secondary | ICD-10-CM | POA: Diagnosis not present

## 2022-06-27 DIAGNOSIS — Z3A01 Less than 8 weeks gestation of pregnancy: Secondary | ICD-10-CM | POA: Diagnosis not present

## 2022-06-27 LAB — CBC
HCT: 35.4 % — ABNORMAL LOW (ref 36.0–46.0)
Hemoglobin: 11.4 g/dL — ABNORMAL LOW (ref 12.0–15.0)
MCH: 27.9 pg (ref 26.0–34.0)
MCHC: 32.2 g/dL (ref 30.0–36.0)
MCV: 86.6 fL (ref 80.0–100.0)
Platelets: 244 10*3/uL (ref 150–400)
RBC: 4.09 MIL/uL (ref 3.87–5.11)
RDW: 13.2 % (ref 11.5–15.5)
WBC: 9 10*3/uL (ref 4.0–10.5)
nRBC: 0 % (ref 0.0–0.2)

## 2022-06-27 LAB — BASIC METABOLIC PANEL
Anion gap: 9 (ref 5–15)
BUN: 7 mg/dL (ref 6–20)
CO2: 25 mmol/L (ref 22–32)
Calcium: 9.3 mg/dL (ref 8.9–10.3)
Chloride: 102 mmol/L (ref 98–111)
Creatinine, Ser: 0.63 mg/dL (ref 0.44–1.00)
GFR, Estimated: >60 mL/min (ref >60)
Glucose, Bld: 103 mg/dL — ABNORMAL HIGH (ref 70–99)
Potassium: 3.9 mmol/L (ref 3.5–5.1)
Sodium: 136 mmol/L (ref 135–145)

## 2022-06-27 LAB — URINALYSIS, ROUTINE W REFLEX MICROSCOPIC
Bacteria, UA: NONE SEEN
Bilirubin Urine: NEGATIVE
Glucose, UA: NEGATIVE mg/dL
Hgb urine dipstick: NEGATIVE
Ketones, ur: NEGATIVE mg/dL
Nitrite: NEGATIVE
Protein, ur: NEGATIVE mg/dL
Specific Gravity, Urine: 1.013 (ref 1.005–1.030)
pH: 6 (ref 5.0–8.0)

## 2022-06-27 LAB — HCG, QUANTITATIVE, PREGNANCY: hCG, Beta Chain, Quant, S: 116870 m[IU]/mL — ABNORMAL HIGH (ref <5)

## 2022-06-27 LAB — POCT PREGNANCY, URINE: Preg Test, Ur: POSITIVE — AB

## 2022-06-27 MED ORDER — DOXYLAMINE-PYRIDOXINE 10-10 MG PO TBEC: 2.0000 | DELAYED_RELEASE_TABLET | Freq: Every day | ORAL | 1 refills | Status: DC

## 2022-06-27 NOTE — MAU Note (Addendum)
RN went into pt room to collect vaginal swabs for wet prep and gc/chlamydia; pt was uneasy about obtaining the swabs. RN explained what the swabs would test for and offered the pt to self swab if that would make her more comfortable. Pt stated they did a swab in her OB office during a speculum exam. RN explained that we do not have access to her records from her OB office, so the tests would need to be completed here to rule out any infections. RN also explained that a speculum exam is not required in order to obtain the swabs. RN informed pt that she is able to refuse the vaginal swabs if she did not want to get them done. Pt stated she did not want to do the swabs and she would follow up with her primary OB. RN informed M. Bhambri, CNM - orders discontinued.

## 2022-06-27 NOTE — MAU Provider Note (Signed)
History     CSN: [id number]  Arrival date and time: 06/27/22 [id number]   Event Date/Time   First Provider Initiated Contact with Patient 06/27/22 0630      Chief Complaint  Patient presents with   Emesis During Pregnancy   Abdominal Pain   Back Pain   Leg Pain   31 y.o. [id number] @[redacted]w[redacted]d  presenting with multiple complaints. Reports nausea and vomiting over the last week. No vomiting since 7/3. She has tried ginger but it didn't help and left a bad taste in her mouth. Reports LBP and pain down the front of her thighs for about 2 weeks. Pain is shooting but feels numb at times. Endorses bilateral LAP that is intermittent and usually subsides with standing. She is currently not having any of the above sx and feels hungry.    OB History     Gravida  6   Para  2   Term  2   Preterm  0   AB  3   Living  2      SAB  3   IAB  0   Ectopic  0   Multiple  0   Live Births  2           Past Medical History:  Diagnosis Date   Anemia    Anxiety    panic attacks   Asthma    inhaler PRN- rarely uses   History of postpartum depression    mild- no meds   Hx of chlamydia infection 07/2013   Hx of tooth extraction 2015    Past Surgical History:  Procedure Laterality Date   ARTHROTOMY Right 01/14/2020   Procedure: ARTHROTOMY;  Surgeon: Hiram Gash, MD;  Location: [facility];  Service: Orthopedics;  Laterality: Right;   CESAREAN SECTION  04/16/2012   Procedure: CESAREAN SECTION;  Surgeon: Margarette Asal, MD;  Location: [facility];  Service: Gynecology;  Laterality: N/A;   CESAREAN SECTION N/A 08/03/2015   Procedure: CESAREAN SECTION;  Surgeon: Molli Posey, MD;  Location: [facility];  Service: Obstetrics;  Laterality: N/A;  Repeat edc 08/10/15 nkda   HARDWARE REMOVAL Right 01/14/2020   Procedure: HARDWARE REMOVAL;  Surgeon: Hiram Gash, MD;  Location: [facility];  Service: Orthopedics;  Laterality: Right;   NO PAST SURGERIES     ORIF HUMERUS  FRACTURE Right 04/24/2019   Procedure: OPEN REDUCTION INTERNAL FIXATION (ORIF) DISTAL HUMERUS FRACTURE;  Surgeon: Hiram Gash, MD;  Location: [facility];  Service: Orthopedics;  Laterality: Right;   TONSILLECTOMY     ULNAR NERVE TRANSPOSITION Right 01/14/2020   Procedure: RIGHT ELBOW ARTHROTOMY WITH CAPSULAR EXCISION , REMOVAL RETAINED HARDWARE, NEUROPLASTY AND TRANSPOSITION,ULNAR NERVE AT ELBOW;  Surgeon: Hiram Gash, MD;  Location: [facility];  Service: Orthopedics;  Laterality: Right;  BLOCK EXPAREL    Family History  Problem Relation Age of Onset   Kidney disease Mother    Migraines Mother    Diabetes Father    Breast cancer Father    Heart disease Maternal Grandmother 62       CHF    Social History   Tobacco Use   Smoking status: Never   Smokeless tobacco: Never  Vaping Use   Vaping Use: Never used  Substance Use Topics   Alcohol use: No   Drug use: No    Allergies: No Known Allergies  Medications Prior to Admission  Medication Sig Dispense Refill Last Dose   Prenatal Vit-Fe  Fumarate-FA (PRENATAL VITAMIN) 27-0.8 MG TABS Take 1 tablet by mouth daily. 30 tablet  06/26/2022   progesterone (PROMETRIUM) 200 MG capsule Take 200 mg by mouth daily.   06/26/2022   Doxylamine-Pyridoxine 10-10 MG TBEC Take 2 tablets by mouth 2 (two) times daily as needed. (Patient not taking: Reported on 06/27/2022) 20 tablet 1 Not Taking   famotidine (PEPCID) 20 MG tablet Take 1 tablet (20 mg total) by mouth 2 (two) times daily. (Patient not taking: Reported on 06/27/2022) 60 tablet 1 Not Taking   hydrOXYzine (VISTARIL) 25 MG capsule Take 1 capsule (25 mg total) by mouth every 8 (eight) hours as needed for anxiety. (Patient not taking: Reported on 06/27/2022) 30 capsule 1 Not Taking    Review of Systems  Constitutional:  Negative for chills and fever.  Gastrointestinal:  Positive for abdominal pain, nausea and vomiting.  Genitourinary:  Negative for vaginal bleeding and vaginal discharge.   Musculoskeletal:  Positive for back pain.   Physical Exam   Blood pressure 102/67, pulse 67, temperature 98.6 F (37 C), resp. rate 16, height 5\' 5"  (1.651 m), weight 99.3 kg, last menstrual period 10/01/2021, SpO2 100 %, unknown if currently breastfeeding.  Physical Exam Vitals and nursing note reviewed.  Constitutional:      General: She is not in acute distress.    Appearance: Normal appearance.  HENT:     Head: Normocephalic and atraumatic.  Cardiovascular:     Rate and Rhythm: Normal rate.  Pulmonary:     Effort: Pulmonary effort is normal. No respiratory distress.  Abdominal:     General: There is no distension.     Palpations: Abdomen is soft. There is no mass.     Tenderness: There is no abdominal tenderness. There is no right CVA tenderness, left CVA tenderness, guarding or rebound.     Hernia: No hernia is present.  Musculoskeletal:        General: Normal range of motion.     Cervical back: Normal and normal range of motion.     Thoracic back: Normal.     Lumbar back: Normal.  Skin:    General: Skin is warm and dry.  Neurological:     General: No focal deficit present.     Mental Status: She is alert and oriented to person, place, and time.  Psychiatric:        Mood and Affect: Mood normal.        Behavior: Behavior normal.    Results for orders placed or performed during the hospital encounter of 06/27/22 (from the past 24 hour(s))  Urinalysis, Routine w reflex microscopic     Status: Abnormal   Collection Time: 06/27/22  5:45 AM  Result Value Ref Range   Color, Urine YELLOW YELLOW   APPearance HAZY (A) CLEAR   Specific Gravity, Urine 1.013 1.005 - 1.030   pH 6.0 5.0 - 8.0   Glucose, UA NEGATIVE NEGATIVE mg/dL   Hgb urine dipstick NEGATIVE NEGATIVE   Bilirubin Urine NEGATIVE NEGATIVE   Ketones, ur NEGATIVE NEGATIVE mg/dL   Protein, ur NEGATIVE NEGATIVE mg/dL   Nitrite NEGATIVE NEGATIVE   Leukocytes,Ua SMALL (A) NEGATIVE   RBC / HPF 11-20 0 - 5 RBC/hpf    WBC, UA 0-5 0 - 5 WBC/hpf   Bacteria, UA NONE SEEN NONE SEEN   Squamous Epithelial / LPF 6-10 0 - 5   Mucus PRESENT    Budding Yeast PRESENT    Hyphae Yeast PRESENT   Pregnancy, urine POC  Status: Abnormal   Collection Time: 06/27/22  5:59 AM  Result Value Ref Range   Preg Test, Ur POSITIVE (A) NEGATIVE  CBC     Status: Abnormal   Collection Time: 06/27/22  6:53 AM  Result Value Ref Range   WBC 9.0 4.0 - 10.5 K/uL   RBC 4.09 3.87 - 5.11 MIL/uL   Hemoglobin 11.4 (L) 12.0 - 15.0 g/dL   HCT 86.5 (L) 78.4 - 69.6 %   MCV 86.6 80.0 - 100.0 fL   MCH 27.9 26.0 - 34.0 pg   MCHC 32.2 30.0 - 36.0 g/dL   RDW 29.5 28.4 - 13.2 %   Platelets 244 150 - 400 K/uL   nRBC 0.0 0.0 - 0.2 %  hCG, quantitative, pregnancy     Status: Abnormal   Collection Time: 06/27/22  6:53 AM  Result Value Ref Range   hCG, Beta Chain, Quant, S 116,870 (H) <5 mIU/mL  Basic metabolic panel     Status: Abnormal   Collection Time: 06/27/22  6:53 AM  Result Value Ref Range   Sodium 136 135 - 145 mmol/L   Potassium 3.9 3.5 - 5.1 mmol/L   Chloride 102 98 - 111 mmol/L   CO2 25 22 - 32 mmol/L   Glucose, Bld 103 (H) 70 - 99 mg/dL   BUN 7 6 - 20 mg/dL   Creatinine, Ser 4.40 0.44 - 1.00 mg/dL   Calcium 9.3 8.9 - 10.2 mg/dL   GFR, Estimated >72 >53 mL/min   Anion gap 9 5 - 15   US OB LESS THAN 14 WEEKS WITH OB TRANSVAGINAL  Result Date: 06/27/2022 CLINICAL DATA:  31 year old female with radiating abdominal pain for 2 weeks in the 1st trimester of pregnancy. Quantitative beta hCG is pending. Unknown dates by LMP. EXAM: OBSTETRIC <14 WK [place] AND TRANSVAGINAL OB US TECHNIQUE: Both transabdominal and transvaginal ultrasound examinations were performed for complete evaluation of the gestation as well as the maternal uterus, adnexal regions, and pelvic cul-de-sac. Transvaginal technique was performed to assess early pregnancy. COMPARISON:  None relevant. FINDINGS: Intrauterine gestational sac: Single Yolk sac:  Visible Embryo:   Visible Cardiac Activity: Detected Heart Rate: 136 bpm CRL:  10.6 mm   7 w   1 d                  [place] EDC: 02/12/2023 Subchorionic hemorrhage:  Small volume (image 50). Maternal uterus/adnexae: No pelvic free fluid. Both ovaries appear normal, with probable corpus luteum on the right. The left ovary is 1.9 by 1.3 x 1.9 cm. The right ovary is 2.9 x 2.4 x 5.4 cm. IMPRESSION: 1. Single living IUP with estimated gestational age of [redacted] weeks and 1 day by crown-rump length. 2. Small volume subchorionic hemorrhage. Electronically Signed   By: Odessa Fleming M.D.   On: 06/27/2022 07:55    MAU Course  Procedures  MDM Labs and [place] ordered and reviewed. Normal IUP on [place]. Pain likely MSK, discussed comfort measures. Stable for discharge home.   Assessment and Plan   1. [redacted] weeks gestation of pregnancy   2. Musculoskeletal pain   3. Morning sickness    Discharge home Follow up at [facility] as scheduled SAB precautions Rx Diclegis  Allergies as of 06/27/2022   No Known Allergies      Medication List     STOP taking these medications    hydrOXYzine 25 MG capsule Commonly known as: VISTARIL       TAKE these  medications    Doxylamine-Pyridoxine 10-10 MG Tbec Take 2 tablets by mouth at bedtime. May also take 1 tab in am and 1 tab in afternoon What changed:  when to take this reasons to take this additional instructions   famotidine 20 MG tablet Commonly known as: PEPCID Take 1 tablet (20 mg total) by mouth 2 (two) times daily.   Prenatal Vitamin 27-0.8 MG Tabs Take 1 tablet by mouth daily.   progesterone 200 MG capsule Commonly known as: PROMETRIUM Take 200 mg by mouth daily.         Julianne Handler, CNM 06/27/2022, 8:13 AM

## 2022-06-27 NOTE — MAU Note (Signed)
.  Bailey Bailey is a 31 y.o. at [redacted]w[redacted]d here in MAU reporting pain from lower abdomen down to her feet on both legs. Also having lower back pain that radiates to feet. Pain present about 2 wks. At first pain pain subsided when walking but now it does not subside. Having a lot of n/v and also a headache.  LMP: unknown Onset of complaint: 2wks Pain score: 7 Vitals:   06/27/22 0548  Pulse: 74  Resp: 16  Temp: 98.6 F (37 C)  SpO2: 100%     FHT:n/a Lab orders placed from triage:   Upt and u/a

## 2022-07-06 ENCOUNTER — Inpatient Hospital Stay (HOSPITAL_COMMUNITY)
Admission: AD | Admit: 2022-07-06 | Discharge: 2022-07-06 | Disposition: A | Discharge status: Home / Self Care | Payer: Medicaid Other | Attending: Obstetrics and Gynecology | Admitting: Obstetrics and Gynecology

## 2022-07-06 ENCOUNTER — Other Ambulatory Visit: Payer: Self-pay

## 2022-07-06 ENCOUNTER — Encounter (HOSPITAL_COMMUNITY): Payer: Self-pay | Admitting: Obstetrics and Gynecology

## 2022-07-06 DIAGNOSIS — O26891 Other specified pregnancy related conditions, first trimester: Secondary | ICD-10-CM | POA: Insufficient documentation

## 2022-07-06 DIAGNOSIS — O219 Vomiting of pregnancy, unspecified: Secondary | ICD-10-CM | POA: Diagnosis not present

## 2022-07-06 DIAGNOSIS — O218 Other vomiting complicating pregnancy: Secondary | ICD-10-CM | POA: Diagnosis present

## 2022-07-06 DIAGNOSIS — Z3A08 8 weeks gestation of pregnancy: Secondary | ICD-10-CM | POA: Insufficient documentation

## 2022-07-06 LAB — URINALYSIS, ROUTINE W REFLEX MICROSCOPIC
Bilirubin Urine: NEGATIVE
Glucose, UA: NEGATIVE mg/dL
Hgb urine dipstick: NEGATIVE
Ketones, ur: 80 mg/dL — AB
Leukocytes,Ua: NEGATIVE
Nitrite: NEGATIVE
Protein, ur: 30 mg/dL — AB
Specific Gravity, Urine: 1.025 (ref 1.005–1.030)
pH: 5 (ref 5.0–8.0)

## 2022-07-06 LAB — BASIC METABOLIC PANEL
Anion gap: 11 (ref 5–15)
BUN: <5 mg/dL — ABNORMAL LOW (ref 6–20)
CO2: 24 mmol/L (ref 22–32)
Calcium: 9.4 mg/dL (ref 8.9–10.3)
Chloride: 101 mmol/L (ref 98–111)
Creatinine, Ser: 0.58 mg/dL (ref 0.44–1.00)
GFR, Estimated: >60 mL/min (ref >60)
Glucose, Bld: 81 mg/dL (ref 70–99)
Potassium: 4 mmol/L (ref 3.5–5.1)
Sodium: 136 mmol/L (ref 135–145)

## 2022-07-06 LAB — HEMOGLOBIN AND HEMATOCRIT, BLOOD
HCT: 36.7 % (ref 36.0–46.0)
Hemoglobin: 11.8 g/dL — ABNORMAL LOW (ref 12.0–15.0)

## 2022-07-06 MED ORDER — METOCLOPRAMIDE HCL 10 MG PO TABS
10.0000 mg | ORAL_TABLET | Freq: Three times a day (TID) | ORAL | 0 refills | Status: DC | PRN
Start: 2022-07-06 — End: 2022-08-19

## 2022-07-06 MED ORDER — METOCLOPRAMIDE HCL 5 MG/ML IJ SOLN
10.0000 mg | Freq: Once | INTRAMUSCULAR | Status: AC
  Administered 2022-07-06: 10 mg via INTRAVENOUS
  Filled 2022-07-06: qty 2

## 2022-07-06 MED ORDER — SCOPOLAMINE 1 MG/3DAYS TD PT72SCOPOLAMINE 1 MG/3DAYS
1.0000 | MEDICATED_PATCH | Freq: Once | TRANSDERMAL | Status: DC
Start: 2022-07-06 — End: 2022-07-06
  Administered 2022-07-06: 1.5 mg via TRANSDERMAL
  Filled 2022-07-06: qty 1

## 2022-07-06 MED ORDER — LACTATED RINGERS IV BOLUS
1000.0000 mL | Freq: Once | INTRAVENOUS | Status: AC
  Administered 2022-07-06: 1000 mL via INTRAVENOUS

## 2022-07-06 MED ORDER — SCOPOLAMINE 1 MG/3DAYS TD PT72: 1.0000 | MEDICATED_PATCH | TRANSDERMAL | 0 refills | Status: DC

## 2022-07-06 NOTE — MAU Provider Note (Signed)
History     [id number]  Arrival date and time: 07/06/22 1236    Chief Complaint  Patient presents with   Dizziness   Morning Sickness     HPI Bailey Bailey is a 31 y.o. at [redacted]w[redacted]d who presents for nausea, vomiting, & dizziness. Reports continued nausea & vomiting with the pregnancy. Vomits 2-3 times per day, everyday. States she's unable to keep down food. Has been taking diclegis but states she doesn't take it regularly because it makes her feel lightheaded & dizzy. Reports some pain in LLQ. Last BM was this morning but states she has been constipated.  Denies fever, LOC, dysuria, vaginal bleeding.   OB History     Gravida  6   Para  2   Term  2   Preterm  0   AB  3   Living  2      SAB  3   IAB  0   Ectopic  0   Multiple  0   Live Births  2           Past Medical History:  Diagnosis Date   Anemia    Anxiety    panic attacks   Asthma    inhaler PRN- rarely uses   History of postpartum depression    mild- no meds   Hx of chlamydia infection 07/2013   Hx of tooth extraction 2015    Past Surgical History:  Procedure Laterality Date   ARTHROTOMY Right 01/14/2020   Procedure: ARTHROTOMY;  Surgeon: Bjorn Pippin, MD;  Location: [facility];  Service: Orthopedics;  Laterality: Right;   CESAREAN SECTION  04/16/2012   Procedure: CESAREAN SECTION;  Surgeon: Meriel Pica, MD;  Location: [facility];  Service: Gynecology;  Laterality: N/A;   CESAREAN SECTION N/A 08/03/2015   Procedure: CESAREAN SECTION;  Surgeon: Richarda Overlie, MD;  Location: [facility];  Service: Obstetrics;  Laterality: N/A;  Repeat edc 08/10/15 nkda   HARDWARE REMOVAL Right 01/14/2020   Procedure: HARDWARE REMOVAL;  Surgeon: Bjorn Pippin, MD;  Location: [facility];  Service: Orthopedics;  Laterality: Right;   ORIF HUMERUS FRACTURE Right 04/24/2019   Procedure: OPEN REDUCTION INTERNAL FIXATION (ORIF) DISTAL HUMERUS FRACTURE;  Surgeon: Bjorn Pippin, MD;   Location: [facility];  Service: Orthopedics;  Laterality: Right;   TONSILLECTOMY     ULNAR NERVE TRANSPOSITION Right 01/14/2020   Procedure: RIGHT ELBOW ARTHROTOMY WITH CAPSULAR EXCISION , REMOVAL RETAINED HARDWARE, NEUROPLASTY AND TRANSPOSITION,ULNAR NERVE AT ELBOW;  Surgeon: Bjorn Pippin, MD;  Location: [facility];  Service: Orthopedics;  Laterality: Right;  BLOCK EXPAREL    Family History  Problem Relation Age of Onset   Kidney disease Mother    Migraines Mother    Diabetes Father    Breast cancer Father    Heart disease Maternal Grandmother 44       CHF    No Known Allergies  No current facility-administered medications on file prior to encounter.   Current Outpatient Medications on File Prior to Encounter  Medication Sig Dispense Refill   Doxylamine-Pyridoxine 10-10 MG TBEC Take 2 tablets by mouth at bedtime. May also take 1 tab in am and 1 tab in afternoon 100 tablet 1   Prenatal Vit-Fe Fumarate-FA (PRENATAL VITAMIN) 27-0.8 MG TABS Take 1 tablet by mouth daily. 30 tablet    progesterone (PROMETRIUM) 200 MG capsule Take 200 mg by mouth daily.     famotidine (PEPCID) 20 MG tablet  Take 1 tablet (20 mg total) by mouth 2 (two) times daily. (Patient not taking: Reported on 06/27/2022) 60 tablet 1     ROS Pertinent positives and negative per HPI, all others reviewed and negative  Physical Exam   BP 111/68 (BP Location: Right Arm)   Pulse 77   Temp 98.9 F (37.2 C) (Oral)   Resp 16   Ht 5\' 5"  (1.651 m)   Wt 96.4 kg   LMP 10/01/2021   SpO2 96% Comment: room air  BMI 35.36 kg/m   Patient Vitals for the past 24 hrs:  BP Temp Temp src Pulse Resp SpO2 Height Weight  07/06/22 1253 111/68 98.9 F (37.2 C) Oral 77 16 96 % -- --  07/06/22 1250 -- -- -- -- -- -- 5\' 5"  (1.651 m) 96.4 kg    Physical Exam Vitals and nursing note reviewed.  Constitutional:      General: She is not in acute distress.    Appearance: Normal appearance.  HENT:     Head: Normocephalic  and atraumatic.  Eyes:     Conjunctiva/sclera: Conjunctivae normal.  Cardiovascular:     Rate and Rhythm: Normal rate and regular rhythm.     Heart sounds: Normal heart sounds.  Pulmonary:     Effort: Pulmonary effort is normal. No respiratory distress.     Breath sounds: Normal breath sounds. No wheezing.  Abdominal:     Palpations: Abdomen is soft.     Tenderness: There is no abdominal tenderness.  Skin:    General: Skin is warm and dry.  Neurological:     Mental Status: She is alert.       Bedside Ultrasound Pt informed that the ultrasound is considered a limited OB ultrasound and is not intended to be a complete ultrasound exam.  Patient also informed that the ultrasound is not being completed with the intent of assessing for fetal or placental anomalies or any pelvic abnormalities.  Explained that the purpose of today's ultrasound is to assess for  viability.  Patient acknowledges the purpose of the exam and the limitations of the study.   \ My interpretation: Live IUP with FHR 160 bpm   Labs Results for orders placed or performed during the hospital encounter of 07/06/22 (from the past 24 hour(s))  Urinalysis, Routine w reflex microscopic Urine, Clean Catch     Status: Abnormal   Collection Time: 07/06/22  1:01 PM  Result Value Ref Range   Color, Urine AMBER (A) YELLOW   APPearance HAZY (A) CLEAR   Specific Gravity, Urine 1.025 1.005 - 1.030   pH 5.0 5.0 - 8.0   Glucose, UA NEGATIVE NEGATIVE mg/dL   Hgb urine dipstick NEGATIVE NEGATIVE   Bilirubin Urine NEGATIVE NEGATIVE   Ketones, ur 80 (A) NEGATIVE mg/dL   Protein, ur 30 (A) NEGATIVE mg/dL   Nitrite NEGATIVE NEGATIVE   Leukocytes,Ua NEGATIVE NEGATIVE   RBC / HPF 6-10 0 - 5 RBC/hpf   WBC, UA 0-5 0 - 5 WBC/hpf   Bacteria, UA RARE (A) NONE SEEN   Squamous Epithelial / LPF 6-10 0 - 5   Mucus PRESENT   Hemoglobin and hematocrit, blood     Status: Abnormal   Collection Time: 07/06/22  2:11 PM  Result Value Ref Range    Hemoglobin 11.8 (L) 12.0 - 15.0 g/dL   HCT 07/08/22 07/08/22 - 08.6 %  Basic metabolic panel     Status: Abnormal   Collection Time: 07/06/22  2:11 PM  Result  Value Ref Range   Sodium 136 135 - 145 mmol/L   Potassium 4.0 3.5 - 5.1 mmol/L   Chloride 101 98 - 111 mmol/L   CO2 24 22 - 32 mmol/L   Glucose, Bld 81 70 - 99 mg/dL   BUN <5 (L) 6 - 20 mg/dL   Creatinine, Ser 0.09 0.44 - 1.00 mg/dL   Calcium 9.4 8.9 - 23.3 mg/dL   GFR, Estimated >00 >76 mL/min   Anion gap 11 5 - 15    Imaging No results found.  MAU Course  Procedures Lab Orders         Urinalysis, Routine w reflex microscopic Urine, Clean Catch         Hemoglobin and hematocrit, blood         Basic metabolic panel     Meds ordered this encounter  Medications   lactated ringers bolus 1,000 mL   metoCLOPramide (REGLAN) injection 10 mg   scopolamine (TRANSDERM-SCOP) 1 MG/3DAYS 1.5 mg   metoCLOPramide (REGLAN) 10 MG tablet    Sig: Take 1 tablet (10 mg total) by mouth every 8 (eight) hours as needed for nausea.    Dispense:  30 tablet    Refill:  0    Order Specific Question:   Supervising Provider    Answer:   CONSTANT, PEGGY [[id number]]   scopolamine (TRANSDERM-SCOP) 1 MG/3DAYS    Sig: Place 1 patch (1.5 mg total) onto the skin every 3 (three) days.    Dispense:  10 patch    Refill:  0    Order Specific Question:   Supervising Provider    Answer:   CONSTANT, PEGGY [[id number]]   Imaging Orders  No imaging studies ordered today    MDM Labs ordered & reviewed Treated with IV fluids & meds - will rx reglan & scop patch Reviewed previous visits in epic BSUS for fetal viability  Hemoglobin & electrolytes stable. Patient reports improvement in symptoms after treatment. Suspect dizzy/lightheaded symptoms side effect of doxylamine.  Will change meds to reglan & scop patch.  Assessment and Plan   1. Nausea and vomiting during pregnancy prior to [redacted] weeks gestation   2. [redacted] weeks gestation of pregnancy    -Rx reglan &  scopolamine -Restart pepcid previously prescribed -Reviewed reasons to return to MAU  Judeth Horn, NP 07/06/22 3:57 PM

## 2022-07-06 NOTE — Discharge Instructions (Signed)

## 2022-07-06 NOTE — MAU Note (Signed)
Bailey Bailey is a 30 y.o. at [redacted]w[redacted]d here in MAU reporting: feeling very faint. Thinks due to vomiting and is not eating. And is having left sided pain. No bleeding or abnormal discharge.   Onset of complaint: ongoing  Pain score: 8/10  Vitals:   07/06/22 1253  BP: 111/68  Pulse: 77  Resp: 16  Temp: 98.9 F (37.2 C)  SpO2: 96%     Lab orders placed from triage: UA

## 2022-07-09 LAB — OB RESULTS CONSOLE RPR: RPR: NONREACTIVE

## 2022-07-09 LAB — OB RESULTS CONSOLE ABO/RH: RH Type: POSITIVE

## 2022-07-09 LAB — OB RESULTS CONSOLE RUBELLA ANTIBODY, IGM: Rubella: IMMUNE

## 2022-07-09 LAB — HEPATITIS C ANTIBODY: HCV Ab: NEGATIVE

## 2022-07-09 LAB — OB RESULTS CONSOLE HIV ANTIBODY (ROUTINE TESTING): HIV: NONREACTIVE

## 2022-07-09 LAB — OB RESULTS CONSOLE ANTIBODY SCREEN: Antibody Screen: NEGATIVE

## 2022-07-09 LAB — OB RESULTS CONSOLE HEPATITIS B SURFACE ANTIGEN: Hepatitis B Surface Ag: NEGATIVE

## 2022-07-10 ENCOUNTER — Other Ambulatory Visit: Payer: Self-pay

## 2022-07-10 ENCOUNTER — Inpatient Hospital Stay (HOSPITAL_COMMUNITY)
Admission: AD | Admit: 2022-07-10 | Discharge: 2022-07-10 | Disposition: A | Discharge status: Home / Self Care | Payer: Medicaid Other | Attending: Obstetrics and Gynecology | Admitting: Obstetrics and Gynecology

## 2022-07-10 DIAGNOSIS — O99341 Other mental disorders complicating pregnancy, first trimester: Secondary | ICD-10-CM | POA: Insufficient documentation

## 2022-07-10 DIAGNOSIS — O99340 Other mental disorders complicating pregnancy, unspecified trimester: Secondary | ICD-10-CM | POA: Diagnosis not present

## 2022-07-10 DIAGNOSIS — Z3A09 9 weeks gestation of pregnancy: Secondary | ICD-10-CM | POA: Diagnosis not present

## 2022-07-10 DIAGNOSIS — F419 Anxiety disorder, unspecified: Secondary | ICD-10-CM | POA: Diagnosis not present

## 2022-07-10 DIAGNOSIS — R109 Unspecified abdominal pain: Secondary | ICD-10-CM | POA: Diagnosis present

## 2022-07-10 MED ORDER — HYDROXYZINE PAMOATE 50 MG PO CAPS
50.0000 mg | ORAL_CAPSULE | Freq: Three times a day (TID) | ORAL | 0 refills | Status: DC | PRN
Start: 2022-07-10 — End: 2023-01-13

## 2022-07-10 NOTE — Discharge Instructions (Signed)

## 2022-07-10 NOTE — MAU Note (Signed)
Bailey Bailey is a 31 y.o. at [redacted]w[redacted]d here in MAU reporting: she's having cramping that began last night and have worsened today.  Denies VB.  Reports hasn't taken any meds for pain.  Onset of complaint: last night Pain score: 6/10 Vitals:   07/10/22 1640  BP: 120/65  Pulse: 90  Resp: 20  Temp: 97.8 F (36.6 C)  SpO2: 98%     FHT:N/A Lab orders placed from triage:   UA

## 2022-07-10 NOTE — Progress Notes (Signed)
Bedside US done by C. Druscilla Brownie, CNM while in triage.  FHR seen.

## 2022-07-10 NOTE — MAU Provider Note (Signed)
Event Date/Time   First Provider Initiated Contact with Patient 07/10/22 1643      S [name] is a 31 y.o. [id number] patient who presents to MAU today with complaint of abdominal cramping. She reports lower abdominal cramping that has been ongoing throughout the pregnancy. This is not new and unchanged. She reports she is going out of the country and was really worried. She reports her anxiety has been really bad since finding out she was pregnant. She state she used to take Zoloft but her family told her it caused ADHD in her older child and she is afraid to take it again. She denies any vaginal bleeding or leaking of fluid.    O BP 120/65 (BP Location: Right Arm)   Pulse 90   Temp 97.8 F (36.6 C) (Oral)   Resp 20   Ht 5\' 5"  (1.651 m)   Wt 96.2 kg   LMP 10/01/2021   SpO2 98%   BMI 35.28 kg/m  Physical Exam Vitals and nursing note reviewed.  Constitutional:      General: She is not in acute distress.    Appearance: She is well-developed.  HENT:     Head: Normocephalic.  Eyes:     Pupils: Pupils are equal, round, and reactive to light.  Cardiovascular:     Rate and Rhythm: Normal rate and regular rhythm.     Heart sounds: Normal heart sounds.  Pulmonary:     Effort: Pulmonary effort is normal. No respiratory distress.     Breath sounds: Normal breath sounds.  Abdominal:     General: Bowel sounds are normal. There is no distension.     Palpations: Abdomen is soft.     Tenderness: There is no abdominal tenderness.  Skin:    General: Skin is warm and dry.  Neurological:     Mental Status: She is alert and oriented to person, place, and time.  Psychiatric:        Mood and Affect: Mood normal.        Behavior: Behavior normal.        Thought Content: Thought content normal.        Judgment: Judgment normal.     Pt informed that the ultrasound is considered a limited OB ultrasound and is not intended to be a complete ultrasound exam.  Patient also informed  that the ultrasound is not being completed with the intent of assessing for fetal or placental anomalies or any pelvic abnormalities.  Explained that the purpose of today's ultrasound is to assess for  viability.  Patient acknowledges the purpose of the exam and the limitations of the study.     Active 9 week fetus with FHR 145 bpm  A Medical screening exam complete 1. Anxiety during pregnancy   2. [redacted] weeks gestation of pregnancy    Reassurance provided. Discussed importance of maternal mental health in pregnancy and discussed options for medication. Patient desires medication in a PRN manner. Options reviewed and will send trial to pharmacy.  P -Discharge home in stable condition -Rx for vistaril sent to patient's pharmacy -First trimester precautions discussed -Patient advised to follow-up with OB as scheduled for prenatal care -Patient may return to MAU as needed or if her condition were to change or worsen   12/01/2021, Rolm Bookbinder 07/10/2022 4:43 PM

## 2022-07-12 ENCOUNTER — Inpatient Hospital Stay (HOSPITAL_COMMUNITY)
Admission: AD | Admit: 2022-07-12 | Discharge: 2022-07-12 | Disposition: A | Discharge status: Home / Self Care | Payer: Medicaid Other | Attending: Obstetrics and Gynecology | Admitting: Obstetrics and Gynecology

## 2022-07-12 DIAGNOSIS — F32A Depression, unspecified: Secondary | ICD-10-CM | POA: Diagnosis not present

## 2022-07-12 DIAGNOSIS — F419 Anxiety disorder, unspecified: Secondary | ICD-10-CM | POA: Insufficient documentation

## 2022-07-12 DIAGNOSIS — Z3A09 9 weeks gestation of pregnancy: Secondary | ICD-10-CM | POA: Insufficient documentation

## 2022-07-12 DIAGNOSIS — Z711 Person with feared health complaint in whom no diagnosis is made: Secondary | ICD-10-CM | POA: Insufficient documentation

## 2022-07-12 DIAGNOSIS — O99341 Other mental disorders complicating pregnancy, first trimester: Secondary | ICD-10-CM | POA: Diagnosis present

## 2022-07-12 LAB — URINALYSIS, ROUTINE W REFLEX MICROSCOPIC
Bilirubin Urine: NEGATIVE
Glucose, UA: NEGATIVE mg/dL
Hgb urine dipstick: NEGATIVE
Ketones, ur: NEGATIVE mg/dL
Nitrite: NEGATIVE
Protein, ur: NEGATIVE mg/dL
Specific Gravity, Urine: 1.01 (ref 1.005–1.030)
pH: 5 (ref 5.0–8.0)

## 2022-07-12 NOTE — Discharge Instructions (Signed)

## 2022-07-12 NOTE — MAU Note (Addendum)
Caroline Neill CNM in Triage to see pt and discuss plan of care. Pt then d/c home from Triage by CNM 

## 2022-07-12 NOTE — MAU Note (Signed)
.  Bailey Bailey is a 31 y.o. at [redacted]w[redacted]d here in MAU reporting some lower abd cramping which feels like gas. No BM in 2 days. Very concerned that her nausea has gone away which makes her worry the pregnancy is not doing well. Denies VB.  Onset of complaint: ongoing Pain score: 5 Vitals:   07/12/22 2117 07/12/22 2120  BP:  107/65  Pulse: 91   Resp: 18   Temp: 98 F (36.7 C)   SpO2: 98%      FHT:n/a Lab orders placed from triage:   u/a already in

## 2022-07-12 NOTE — MAU Provider Note (Signed)
Event Date/Time   First Provider Initiated Contact with Patient 07/12/22 2134     S [name] is a 31 y.o. [id number] patient who presents to MAU today stating she is very anxious and depressed. She worried constantly and is very concerned because her pregnancy symptoms have improved. She reports intermittent abdominal cramping but none now. Denies any vaginal bleeding or discharge. She is struggling with daily tasks and is very stressed.   O BP 107/65   Pulse 91   Temp 98 F (36.7 C)   Resp 18   Ht 5\' 5"  (1.651 m)   Wt 95.7 kg   LMP 10/01/2021   SpO2 98%   BMI 35.11 kg/m  Physical Exam Vitals and nursing note reviewed.  Constitutional:      General: She is not in acute distress.    Appearance: She is well-developed.  HENT:     Head: Normocephalic.  Eyes:     Pupils: Pupils are equal, round, and reactive to light.  Cardiovascular:     Rate and Rhythm: Normal rate and regular rhythm.     Heart sounds: Normal heart sounds.  Pulmonary:     Effort: Pulmonary effort is normal. No respiratory distress.     Breath sounds: Normal breath sounds.  Abdominal:     General: Bowel sounds are normal. There is no distension.     Palpations: Abdomen is soft.     Tenderness: There is no abdominal tenderness.  Skin:    General: Skin is warm and dry.  Neurological:     Mental Status: She is alert and oriented to person, place, and time.  Psychiatric:        Mood and Affect: Mood is anxious. Affect is tearful.        Behavior: Behavior normal.        Thought Content: Thought content normal.        Judgment: Judgment normal.     A Medical screening exam complete 1. Physically well but worried   2. Anxiety in pregnancy in first trimester, antepartum   3. [redacted] weeks gestation of pregnancy    -CNM spent >45 minutes at bedside providing reassurance. Patient denies any SI/HI. CNM discussed strong recommendation to restart Zoloft as patient reports it worked well in the past.  Patient has next prenatal appointment on 7/28  P -Discharge home in stable condition -First trimester precautions discussed -Patient advised to follow-up with OB as scheduled for prenatal care -Patient may return to MAU as needed or if her condition were to change or worsen   8/28, Rolm Bookbinder 07/12/2022 9:34 PM

## 2022-07-13 LAB — CULTURE, OB URINE: Culture: <10000 — AB

## 2022-07-19 LAB — OB RESULTS CONSOLE GC/CHLAMYDIA
Chlamydia: NEGATIVE
Neisseria Gonorrhea: NEGATIVE

## 2022-07-31 ENCOUNTER — Encounter (HOSPITAL_COMMUNITY): Payer: Self-pay | Admitting: Obstetrics and Gynecology

## 2022-07-31 ENCOUNTER — Inpatient Hospital Stay (HOSPITAL_COMMUNITY)
Admission: AD | Admit: 2022-07-31 | Discharge: 2022-07-31 | Disposition: A | Discharge status: Home / Self Care | Payer: Medicaid Other | Attending: Obstetrics and Gynecology | Admitting: Obstetrics and Gynecology

## 2022-07-31 DIAGNOSIS — Z3A12 12 weeks gestation of pregnancy: Secondary | ICD-10-CM | POA: Insufficient documentation

## 2022-07-31 DIAGNOSIS — O219 Vomiting of pregnancy, unspecified: Secondary | ICD-10-CM | POA: Insufficient documentation

## 2022-07-31 LAB — URINALYSIS, ROUTINE W REFLEX MICROSCOPIC
Bilirubin Urine: NEGATIVE
Glucose, UA: NEGATIVE mg/dL
Hgb urine dipstick: NEGATIVE
Ketones, ur: 80 mg/dL — AB
Nitrite: NEGATIVE
Protein, ur: 100 mg/dL — AB
Specific Gravity, Urine: 1.03 (ref 1.005–1.030)
pH: 5 (ref 5.0–8.0)

## 2022-07-31 MED ORDER — LACTATED RINGERS IV BOLUS
1000.0000 mL | Freq: Once | INTRAVENOUS | Status: AC
  Administered 2022-07-31: 1000 mL via INTRAVENOUS

## 2022-07-31 MED ORDER — SCOPOLAMINE 1 MG/3DAYS TD PT72: 1.0000 | MEDICATED_PATCH | TRANSDERMAL | 1 refills | Status: DC

## 2022-07-31 MED ORDER — FAMOTIDINE IN NACL 20-0.9 MG/50ML-% IV SOLN
20.0000 mg | Freq: Once | INTRAVENOUS | Status: AC
  Administered 2022-07-31: 20 mg via INTRAVENOUS
  Filled 2022-07-31: qty 50

## 2022-07-31 MED ORDER — METOCLOPRAMIDE HCL 5 MG/ML IJ SOLN
10.0000 mg | Freq: Once | INTRAMUSCULAR | Status: AC
  Administered 2022-07-31: 10 mg via INTRAVENOUS
  Filled 2022-07-31: qty 2

## 2022-07-31 MED ORDER — SCOPOLAMINE 1 MG/3DAYS TD PT72
1.0000 | MEDICATED_PATCH | Freq: Once | TRANSDERMAL | Status: DC
  Administered 2022-07-31: 1.5 mg via TRANSDERMAL
  Filled 2022-07-31: qty 1

## 2022-07-31 NOTE — MAU Note (Signed)
Assisted pt to restroom via wc to collect specimen.  "Very weak"

## 2022-07-31 NOTE — MAU Note (Signed)
Bailey Bailey is a 31 y.o. at [redacted]w[redacted]d here in MAU reporting: pt came by EMS for vomiting.  Started yesterday.  Recently in the [place], returned to [place] on 8/5. No one else on the trip is sick . Reports some pain in LLQ, comes and goes, depending on position, has had NO pain. No bleeding. Wants to make sure the baby is ok, "she is throwing up too much".  Ran out of her nausea meds.  Onset of complaint: yesterday. Pain score: none Vitals:   07/31/22 1154  BP: 126/76  Pulse: (!) 108  Resp: 20  Temp: 98.2 F (36.8 C)  SpO2: 99%     FHT:152 Lab orders placed from triage:  urine

## 2022-07-31 NOTE — MAU Provider Note (Signed)
History     CSN: [id number]  Arrival date and time: 07/31/22 1140   Event Date/Time   First Provider Initiated Contact with Patient 07/31/22 1254      Chief Complaint  Patient presents with   Emesis   Bailey Bailey a  31 y.o. [id number] at [redacted]w[redacted]d presents to MAU via with complaints of nausea and vomiting for the last 3 days. Patient went on a 5 days cruise to the [place] and upon returning ran out of her nausea med's and has taking anything since. Patient states she has vomited 25+ times today. States she feels dizzy and lightheaded. She has not attempted anything, to alleviate nausea since returning. She denies diarrhea and anyone else from the trip to be sick. She also denies fever and pain. She has not other complaints.         OB History     Gravida  6   Para  2   Term  2   Preterm  0   AB  3   Living  2      SAB  2   IAB  0   Ectopic  1   Multiple  0   Live Births  2           Past Medical History:  Diagnosis Date   Anemia    Anxiety    panic attacks   Asthma    inhaler PRN- rarely uses   History of postpartum depression    mild- no meds   Hx of chlamydia infection 07/2013   Hx of tooth extraction 2015    Past Surgical History:  Procedure Laterality Date   ARTHROTOMY Right 01/14/2020   Procedure: ARTHROTOMY;  Surgeon: Bjorn Pippin, MD;  Location: [facility];  Service: Orthopedics;  Laterality: Right;   CESAREAN SECTION  04/16/2012   Procedure: CESAREAN SECTION;  Surgeon: Meriel Pica, MD;  Location: [facility];  Service: Gynecology;  Laterality: N/A;   CESAREAN SECTION N/A 08/03/2015   Procedure: CESAREAN SECTION;  Surgeon: Richarda Overlie, MD;  Location: [facility];  Service: Obstetrics;  Laterality: N/A;  Repeat edc 08/10/15 nkda   HARDWARE REMOVAL Right 01/14/2020   Procedure: HARDWARE REMOVAL;  Surgeon: Bjorn Pippin, MD;  Location: [facility];  Service: Orthopedics;  Laterality: Right;   ORIF HUMERUS  FRACTURE Right 04/24/2019   Procedure: OPEN REDUCTION INTERNAL FIXATION (ORIF) DISTAL HUMERUS FRACTURE;  Surgeon: Bjorn Pippin, MD;  Location: [facility];  Service: Orthopedics;  Laterality: Right;   TONSILLECTOMY     ULNAR NERVE TRANSPOSITION Right 01/14/2020   Procedure: RIGHT ELBOW ARTHROTOMY WITH CAPSULAR EXCISION , REMOVAL RETAINED HARDWARE, NEUROPLASTY AND TRANSPOSITION,ULNAR NERVE AT ELBOW;  Surgeon: Bjorn Pippin, MD;  Location: [facility];  Service: Orthopedics;  Laterality: Right;  BLOCK EXPAREL    Family History  Problem Relation Age of Onset   Kidney disease Mother    Migraines Mother    Diabetes Father    Breast cancer Father    Heart disease Maternal Grandmother 90       CHF    Social History   Tobacco Use   Smoking status: Never   Smokeless tobacco: Never  Vaping Use   Vaping Use: Never used  Substance Use Topics   Alcohol use: No   Drug use: No    Allergies: No Known Allergies  Medications Prior to Admission  Medication Sig Dispense Refill Last Dose   Prenatal Vit-Fe Fumarate-FA (PRENATAL  VITAMIN) 27-0.8 MG TABS Take 1 tablet by mouth daily. 30 tablet  Past Week   progesterone (PROMETRIUM) 200 MG capsule Take 200 mg by mouth daily.   Past Week   scopolamine (TRANSDERM-SCOP) 1 MG/3DAYS Place 1 patch (1.5 mg total) onto the skin every 3 (three) days. 10 patch 0 Past Week   famotidine (PEPCID) 20 MG tablet Take 1 tablet (20 mg total) by mouth 2 (two) times daily. (Patient not taking: Reported on 06/27/2022) 60 tablet 1    hydrOXYzine (VISTARIL) 50 MG capsule Take 1 capsule (50 mg total) by mouth 3 (three) times daily as needed. 30 capsule 0    metoCLOPramide (REGLAN) 10 MG tablet Take 1 tablet (10 mg total) by mouth every 8 (eight) hours as needed for nausea. 30 tablet 0     Review of Systems  Constitutional:  Positive for appetite change. Negative for chills, diaphoresis, fatigue and fever.  Respiratory:  Negative for cough, chest tightness, shortness  of breath and wheezing.   Gastrointestinal:  Positive for nausea and vomiting. Negative for abdominal pain, constipation and diarrhea.  Genitourinary:  Negative for difficulty urinating, flank pain, frequency, vaginal bleeding, vaginal discharge and vaginal pain.  Musculoskeletal:  Negative for back pain.  Allergic/Immunologic: Negative for environmental allergies and food allergies.  Neurological:  Positive for dizziness, weakness and light-headedness. Negative for headaches.  Psychiatric/Behavioral:  Negative for hallucinations and suicidal ideas.    Physical Exam   Blood pressure 126/76, pulse (!) 108, temperature 98.2 F (36.8 C), temperature source Axillary, resp. rate 20, height 5\' 5"  (1.651 m), weight 91.5 kg, last menstrual period 10/01/2021, SpO2 99 %, unknown if currently breastfeeding.  Physical Exam Vitals and nursing note reviewed.  Constitutional:      General: She is not in acute distress.    Appearance: Normal appearance. She is ill-appearing.  HENT:     Head: Normocephalic.  Cardiovascular:     Rate and Rhythm: Normal rate.  Pulmonary:     Effort: Pulmonary effort is normal.  Musculoskeletal:     Cervical back: Normal range of motion.  Skin:    General: Skin is warm and dry.  Neurological:     Mental Status: She is alert and oriented to person, place, and time.  Psychiatric:        Mood and Affect: Mood normal.    MAU Course  Procedures Lab Orders         Culture, OB Urine         Urinalysis, Routine w reflex microscopic Urine, Clean Catch    Meds ordered this encounter  Medications   lactated ringers bolus 1,000 mL   scopolamine (TRANSDERM-SCOP) 1 MG/3DAYS 1.5 mg   metoCLOPramide (REGLAN) injection 10 mg   famotidine (PEPCID) IVPB 20 mg premix    MDM Nausea and vomiting improved with IV fluids, scop patch, Pepcid and Reglan.  Urine reflexed to Culture.  Plan for discharge.   Assessment and Plan   1. Nausea and vomiting in pregnancy   2. [redacted]  weeks gestation of pregnancy    - Discussed nausea and vomiting in pregnancy is normal.  - Refill Rx on Scop patch sent to outpatient pharmacy.  - Encouraged continued use of scop patch as needed. Discussed that nausea and vomiting improves in the 2nd trimester. Information provided on N/V and non-medicinal options for relief.  - Early pregnancy precautions reviewed.  - FHT: 152 - Urine culture pending. Will notify patient of concerning results.  - Patient discharged home in  stable condition and may Return to MAU as needed.   Claudette Head, MSN CNM  07/31/2022, 12:54 PM

## 2022-08-02 LAB — CULTURE, OB URINE: Culture: >=100000 — AB

## 2022-08-19 ENCOUNTER — Inpatient Hospital Stay (HOSPITAL_COMMUNITY)
Admission: AD | Admit: 2022-08-19 | Discharge: 2022-08-19 | Disposition: A | Discharge status: Home / Self Care | Payer: Medicaid Other | Attending: Obstetrics and Gynecology | Admitting: Obstetrics and Gynecology

## 2022-08-19 ENCOUNTER — Encounter (HOSPITAL_COMMUNITY): Payer: Self-pay | Admitting: Obstetrics and Gynecology

## 2022-08-19 ENCOUNTER — Other Ambulatory Visit: Payer: Self-pay

## 2022-08-19 DIAGNOSIS — Z3A14 14 weeks gestation of pregnancy: Secondary | ICD-10-CM | POA: Insufficient documentation

## 2022-08-19 DIAGNOSIS — O219 Vomiting of pregnancy, unspecified: Secondary | ICD-10-CM

## 2022-08-19 LAB — CBC WITH DIFFERENTIAL/PLATELET
Abs Immature Granulocytes: 0.04 10*3/uL (ref 0.00–0.07)
Basophils Absolute: 0 10*3/uL (ref 0.0–0.1)
Basophils Relative: 0 %
Eosinophils Absolute: 0 10*3/uL (ref 0.0–0.5)
Eosinophils Relative: 0 %
HCT: 39.5 % (ref 36.0–46.0)
Hemoglobin: 13.1 g/dL (ref 12.0–15.0)
Immature Granulocytes: 1 %
Lymphocytes Relative: 17 %
Lymphs Abs: 1.5 10*3/uL (ref 0.7–4.0)
MCH: 29.5 pg (ref 26.0–34.0)
MCHC: 33.2 g/dL (ref 30.0–36.0)
MCV: 89 fL (ref 80.0–100.0)
Monocytes Absolute: 0.4 10*3/uL (ref 0.1–1.0)
Monocytes Relative: 4 %
Neutro Abs: 6.8 10*3/uL (ref 1.7–7.7)
Neutrophils Relative %: 78 %
Platelets: 262 10*3/uL (ref 150–400)
RBC: 4.44 MIL/uL (ref 3.87–5.11)
RDW: 13.8 % (ref 11.5–15.5)
WBC: 8.8 10*3/uL (ref 4.0–10.5)
nRBC: 0 % (ref 0.0–0.2)

## 2022-08-19 LAB — URINALYSIS, ROUTINE W REFLEX MICROSCOPIC
Bilirubin Urine: NEGATIVE
Glucose, UA: NEGATIVE mg/dL
Hgb urine dipstick: NEGATIVE
Ketones, ur: 80 mg/dL — AB
Nitrite: NEGATIVE
Protein, ur: 100 mg/dL — AB
RBC / HPF: >50 RBC/hpf — ABNORMAL HIGH (ref 0–5)
Specific Gravity, Urine: 1.029 (ref 1.005–1.030)
Squamous Epithelial / HPF: >50 — ABNORMAL HIGH (ref 0–5)
pH: 5 (ref 5.0–8.0)

## 2022-08-19 LAB — COMPREHENSIVE METABOLIC PANEL
ALT: 27 U/L (ref 0–44)
AST: 22 U/L (ref 15–41)
Albumin: 3.5 g/dL (ref 3.5–5.0)
Alkaline Phosphatase: 46 U/L (ref 38–126)
Anion gap: 11 (ref 5–15)
BUN: 6 mg/dL (ref 6–20)
CO2: 19 mmol/L — ABNORMAL LOW (ref 22–32)
Calcium: 9.7 mg/dL (ref 8.9–10.3)
Chloride: 105 mmol/L (ref 98–111)
Creatinine, Ser: 0.63 mg/dL (ref 0.44–1.00)
GFR, Estimated: >60 mL/min (ref >60)
Glucose, Bld: 88 mg/dL (ref 70–99)
Potassium: 3.5 mmol/L (ref 3.5–5.1)
Sodium: 135 mmol/L (ref 135–145)
Total Bilirubin: 0.8 mg/dL (ref 0.3–1.2)
Total Protein: 8 g/dL (ref 6.5–8.1)

## 2022-08-19 MED ORDER — SCOPOLAMINE 1 MG/3DAYS TD PT72
1.0000 | MEDICATED_PATCH | TRANSDERMAL | Status: DC
Start: 2022-08-19 — End: 2022-08-19
  Administered 2022-08-19: 1.5 mg via TRANSDERMAL
  Filled 2022-08-19: qty 1

## 2022-08-19 MED ORDER — LACTATED RINGERS IV BOLUS
1000.0000 mL | Freq: Once | INTRAVENOUS | Status: AC
  Administered 2022-08-19: 1000 mL via INTRAVENOUS

## 2022-08-19 MED ORDER — PROMETHAZINE HCL 12.5 MG PO TABS: 12.5000 mg | ORAL_TABLET | Freq: Four times a day (QID) | ORAL | 0 refills | Status: DC | PRN

## 2022-08-19 MED ORDER — ONDANSETRON HCL 4 MG/2ML IJ SOLN
4.0000 mg | Freq: Once | INTRAMUSCULAR | Status: AC
  Administered 2022-08-19: 4 mg via INTRAVENOUS
  Filled 2022-08-19: qty 2

## 2022-08-19 MED ORDER — FAMOTIDINE IN NACL 20-0.9 MG/50ML-% IV SOLN
20.0000 mg | Freq: Once | INTRAVENOUS | Status: AC
  Administered 2022-08-19: 20 mg via INTRAVENOUS
  Filled 2022-08-19: qty 50

## 2022-08-19 MED ORDER — SODIUM CHLORIDE 0.9 % IV SOLN
12.5000 mg | Freq: Once | INTRAVENOUS | Status: AC
  Administered 2022-08-19: 12.5 mg via INTRAVENOUS
  Filled 2022-08-19: qty 0.5

## 2022-08-19 NOTE — MAU Provider Note (Signed)
History     CSN: [id number]  Arrival date and time: 08/19/22 1043   Event Date/Time   First Provider Initiated Contact with Patient 08/19/22 1131      Chief Complaint  Patient presents with   Nausea   [name] is a 31 y.o. [id number] at [redacted]w[redacted]d who presents to MAU for nausea and vomiting. Patient states she was using a scopolamine patch up until about 2 days ago when she took it off because it was expired and she did not know that 20 additional patches had been sent to her pharmacy on 8/8, but pt is aware now. Pt states she was also sent Reglan, but reports that this did not work for her.  Patient states in the past 24 hours she has "vomited too many times to count" and states that food and drink come up within minutes of ingestion. She was not experiencing these issues when she was using scopolamine continuously.  Allergies? NKDA Current medications/supplements? PNV    OB History     Gravida  6   Para  2   Term  2   Preterm  0   AB  3   Living  2      SAB  2   IAB  0   Ectopic  1   Multiple  0   Live Births  2           Past Medical History:  Diagnosis Date   Anemia    Anxiety    panic attacks   Asthma    inhaler PRN- rarely uses   History of postpartum depression    mild- no meds   Hx of chlamydia infection 07/2013   Hx of tooth extraction 2015    Past Surgical History:  Procedure Laterality Date   ARTHROTOMY Right 01/14/2020   Procedure: ARTHROTOMY;  Surgeon: Hiram Gash, MD;  Location: [facility];  Service: Orthopedics;  Laterality: Right;   CESAREAN SECTION  04/16/2012   Procedure: CESAREAN SECTION;  Surgeon: Margarette Asal, MD;  Location: [facility];  Service: Gynecology;  Laterality: N/A;   CESAREAN SECTION N/A 08/03/2015   Procedure: CESAREAN SECTION;  Surgeon: Molli Posey, MD;  Location: [facility];  Service: Obstetrics;  Laterality: N/A;  Repeat edc 08/10/15 nkda   HARDWARE REMOVAL Right 01/14/2020    Procedure: HARDWARE REMOVAL;  Surgeon: Hiram Gash, MD;  Location: [facility];  Service: Orthopedics;  Laterality: Right;   ORIF HUMERUS FRACTURE Right 04/24/2019   Procedure: OPEN REDUCTION INTERNAL FIXATION (ORIF) DISTAL HUMERUS FRACTURE;  Surgeon: Hiram Gash, MD;  Location: [facility];  Service: Orthopedics;  Laterality: Right;   TONSILLECTOMY     ULNAR NERVE TRANSPOSITION Right 01/14/2020   Procedure: RIGHT ELBOW ARTHROTOMY WITH CAPSULAR EXCISION , REMOVAL RETAINED HARDWARE, NEUROPLASTY AND TRANSPOSITION,ULNAR NERVE AT ELBOW;  Surgeon: Hiram Gash, MD;  Location: [facility];  Service: Orthopedics;  Laterality: Right;  BLOCK EXPAREL    Family History  Problem Relation Age of Onset   Kidney disease Mother    Migraines Mother    Diabetes Father    Breast cancer Father    Heart disease Maternal Grandmother 62       CHF    Social History   Tobacco Use   Smoking status: Never   Smokeless tobacco: Never  Vaping Use   Vaping Use: Never used  Substance Use Topics   Alcohol use: No   Drug use: No  Allergies: No Known Allergies  Medications Prior to Admission  Medication Sig Dispense Refill Last Dose   metoCLOPramide (REGLAN) 10 MG tablet Take 1 tablet (10 mg total) by mouth every 8 (eight) hours as needed for nausea. 30 tablet 0 08/19/2022   scopolamine (TRANSDERM-SCOP) 1 MG/3DAYS Place 1 patch (1.5 mg total) onto the skin every 3 (three) days. 10 patch 0 Past Week   scopolamine (TRANSDERM-SCOP) 1 MG/3DAYS Place 1 patch (1.5 mg total) onto the skin every 3 (three) days. 10 patch 1 Past Week   famotidine (PEPCID) 20 MG tablet Take 1 tablet (20 mg total) by mouth 2 (two) times daily. (Patient not taking: Reported on 06/27/2022) 60 tablet 1    hydrOXYzine (VISTARIL) 50 MG capsule Take 1 capsule (50 mg total) by mouth 3 (three) times daily as needed. 30 capsule 0    Prenatal Vit-Fe Fumarate-FA (PRENATAL VITAMIN) 27-0.8 MG TABS Take 1 tablet by mouth daily.  30 tablet     progesterone (PROMETRIUM) 200 MG capsule Take 200 mg by mouth daily.       Review of Systems  Constitutional:  Negative for chills, diaphoresis, fatigue and fever.  Eyes:  Negative for visual disturbance.  Respiratory:  Negative for shortness of breath.   Cardiovascular:  Negative for chest pain.  Gastrointestinal:  Positive for nausea and vomiting. Negative for abdominal pain, constipation and diarrhea.  Genitourinary:  Negative for dysuria, flank pain, frequency, pelvic pain, urgency, vaginal bleeding and vaginal discharge.  Neurological:  Negative for dizziness, weakness, light-headedness and headaches.   Physical Exam   Blood pressure 104/62, pulse 90, temperature 98.1 F (36.7 C), temperature source Oral, resp. rate 20, height 5\' 5"  (1.651 m), weight 89.9 kg, last menstrual period 10/01/2021, SpO2 98 %, unknown if currently breastfeeding.  Patient Vitals for the past 24 hrs:  BP Temp Temp src Pulse Resp SpO2 Height Weight  08/19/22 1512 104/62 -- -- 90 -- -- -- --  08/19/22 1241 111/63 -- -- -- -- -- -- --  08/19/22 1054 (!) 111/45 98.1 F (36.7 C) Oral (!) 122 20 98 % -- --  08/19/22 1052 -- -- -- -- -- -- 5\' 5"  (1.651 m) 89.9 kg   Physical Exam Vitals and nursing note reviewed.  Constitutional:      General: She is not in acute distress.    Appearance: Normal appearance. She is not ill-appearing, toxic-appearing or diaphoretic.  HENT:     Head: Normocephalic and atraumatic.  Pulmonary:     Effort: Pulmonary effort is normal.  Neurological:     Mental Status: She is alert and oriented to person, place, and time.  Psychiatric:        Mood and Affect: Mood normal.        Behavior: Behavior normal.        Thought Content: Thought content normal.        Judgment: Judgment normal.    Results for orders placed or performed during the hospital encounter of 08/19/22 (from the past 24 hour(s))  CBC with Differential/Platelet     Status: None   Collection Time:  08/19/22 12:05 PM  Result Value Ref Range   WBC 8.8 4.0 - 10.5 K/uL   RBC 4.44 3.87 - 5.11 MIL/uL   Hemoglobin 13.1 12.0 - 15.0 g/dL   HCT 08/21/22 08/21/22 - 39.7 %   MCV 89.0 80.0 - 100.0 fL   MCH 29.5 26.0 - 34.0 pg   MCHC 33.2 30.0 - 36.0 g/dL   RDW 13.8  11.5 - 15.5 %   Platelets 262 150 - 400 K/uL   nRBC 0.0 0.0 - 0.2 %   Neutrophils Relative % 78 %   Neutro Abs 6.8 1.7 - 7.7 K/uL   Lymphocytes Relative 17 %   Lymphs Abs 1.5 0.7 - 4.0 K/uL   Monocytes Relative 4 %   Monocytes Absolute 0.4 0.1 - 1.0 K/uL   Eosinophils Relative 0 %   Eosinophils Absolute 0.0 0.0 - 0.5 K/uL   Basophils Relative 0 %   Basophils Absolute 0.0 0.0 - 0.1 K/uL   Immature Granulocytes 1 %   Abs Immature Granulocytes 0.04 0.00 - 0.07 K/uL  Comprehensive metabolic panel     Status: Abnormal   Collection Time: 08/19/22 12:05 PM  Result Value Ref Range   Sodium 135 135 - 145 mmol/L   Potassium 3.5 3.5 - 5.1 mmol/L   Chloride 105 98 - 111 mmol/L   CO2 19 (L) 22 - 32 mmol/L   Glucose, Bld 88 70 - 99 mg/dL   BUN 6 6 - 20 mg/dL   Creatinine, Ser 0.63 0.44 - 1.00 mg/dL   Calcium 9.7 8.9 - 10.3 mg/dL   Total Protein 8.0 6.5 - 8.1 g/dL   Albumin 3.5 3.5 - 5.0 g/dL   AST 22 15 - 41 U/L   ALT 27 0 - 44 U/L   Alkaline Phosphatase 46 38 - 126 U/L   Total Bilirubin 0.8 0.3 - 1.2 mg/dL   GFR, Estimated >60 >60 mL/min   Anion gap 11 5 - 15  Urinalysis, Routine w reflex microscopic Urine, Clean Catch     Status: Abnormal   Collection Time: 08/19/22 12:08 PM  Result Value Ref Range   Color, Urine AMBER (A) YELLOW   APPearance CLOUDY (A) CLEAR   Specific Gravity, Urine 1.029 1.005 - 1.030   pH 5.0 5.0 - 8.0   Glucose, UA NEGATIVE NEGATIVE mg/dL   Hgb urine dipstick NEGATIVE NEGATIVE   Bilirubin Urine NEGATIVE NEGATIVE   Ketones, ur 80 (A) NEGATIVE mg/dL   Protein, ur 100 (A) NEGATIVE mg/dL   Nitrite NEGATIVE NEGATIVE   Leukocytes,Ua MODERATE (A) NEGATIVE   RBC / HPF >50 (H) 0 - 5 RBC/hpf   WBC, UA 21-50 0 - 5  WBC/hpf   Bacteria, UA RARE (A) NONE SEEN   Squamous Epithelial / LPF >50 (H) 0 - 5   Mucus PRESENT    Budding Yeast PRESENT    Hyaline Casts, UA PRESENT    No results found.  MAU Course  Procedures  MDM -N/V in pregnancy -weight today 89.9kg, 5.8kg weight loss since 07/12/2022 -UA: amber/cloudy/80ketones/100PRO/mod leuks/rare bacteria, sending urine for culture -CBC w/Diff: WNL -CMP: no abnormalities requiring treatment -1L LR + 20mg  Pepcid + 4mg  Zofran + 12.5mg  Phenergan given, pt reports NV now resolved -pt able to urinate after fluids -PO challenge successful -pt discharged to home in stable condition  Orders Placed This Encounter  Procedures   Culture, OB Urine    Standing Status:   Standing    Number of Occurrences:   1   Urinalysis, Routine w reflex microscopic Urine, Clean Catch    Standing Status:   Standing    Number of Occurrences:   1   CBC with Differential/Platelet    Standing Status:   Standing    Number of Occurrences:   1   Comprehensive metabolic panel    Standing Status:   Standing    Number of Occurrences:   1  Insert peripheral IV    Standing Status:   Standing    Number of Occurrences:   1   Meds ordered this encounter  Medications   lactated ringers bolus 1,000 mL   ondansetron (ZOFRAN) injection 4 mg   famotidine (PEPCID) IVPB 20 mg premix   scopolamine (TRANSDERM-SCOP) 1 MG/3DAYS 1.5 mg   promethazine (PHENERGAN) 12.5 mg in sodium chloride 0.9 % 50 mL IVPB   lactated ringers bolus 1,000 mL   Assessment and Plan   1. Nausea and vomiting in pregnancy   2. [redacted] weeks gestation of pregnancy     Allergies as of 08/19/2022   No Known Allergies      Medication List     STOP taking these medications    metoCLOPramide 10 MG tablet Commonly known as: REGLAN       TAKE these medications    famotidine 20 MG tablet Commonly known as: PEPCID Take 1 tablet (20 mg total) by mouth 2 (two) times daily.   hydrOXYzine 50 MG  capsule Commonly known as: Vistaril Take 1 capsule (50 mg total) by mouth 3 (three) times daily as needed.   Prenatal Vitamin 27-0.8 MG Tabs Take 1 tablet by mouth daily.   progesterone 200 MG capsule Commonly known as: PROMETRIUM Take 200 mg by mouth daily.   promethazine 12.5 MG tablet Commonly known as: PHENERGAN Take 1 tablet (12.5 mg total) by mouth every 6 (six) hours as needed for nausea or vomiting.   scopolamine 1 MG/3DAYS Commonly known as: TRANSDERM-SCOP Place 1 patch (1.5 mg total) onto the skin every 3 (three) days.   scopolamine 1 MG/3DAYS Commonly known as: TRANSDERM-SCOP Place 1 patch (1.5 mg total) onto the skin every 3 (three) days.       -will call with culture results, if positive -pt advised to take medications around the clock and not to stop taking if feeling better -RX phenergan -discussed nonpharmacologic and pharmacologic treatments of N/V -discussed normal expectations for N/V in pregnancy -pt discharged to home in stable condition  Bailey Bailey 08/19/2022, 3:15 PM

## 2022-08-19 NOTE — MAU Note (Signed)
Bailey Bailey is a 31 y.o. at [redacted]w[redacted]d here in MAU reporting: nausea and vomiting. States it started the day before yesterday. States she ran out of scop patch. States it was effective when she was wearing them. No pain or bleeding.   Onset of complaint: ongoing  Pain score: 0/10  Vitals:   08/19/22 1054  BP: (!) 111/45  Pulse: (!) 122  Resp: 20  Temp: 98.1 F (36.7 C)  SpO2: 98%     FHT:152  Lab orders placed from triage: UA

## 2022-08-20 LAB — CULTURE, OB URINE: Culture: 20000 — AB

## 2022-08-24 ENCOUNTER — Encounter (HOSPITAL_COMMUNITY): Payer: Self-pay | Admitting: Obstetrics & Gynecology

## 2022-08-24 ENCOUNTER — Inpatient Hospital Stay (HOSPITAL_COMMUNITY)
Admission: AD | Admit: 2022-08-24 | Discharge: 2022-08-25 | Disposition: A | Discharge status: Home / Self Care | Payer: Medicaid Other | Attending: Obstetrics & Gynecology | Admitting: Obstetrics & Gynecology

## 2022-08-24 DIAGNOSIS — Z3A15 15 weeks gestation of pregnancy: Secondary | ICD-10-CM | POA: Diagnosis not present

## 2022-08-24 DIAGNOSIS — O210 Mild hyperemesis gravidarum: Secondary | ICD-10-CM | POA: Diagnosis not present

## 2022-08-24 LAB — COMPREHENSIVE METABOLIC PANEL
ALT: 24 U/L (ref 0–44)
AST: 19 U/L (ref 15–41)
Albumin: 3.5 g/dL (ref 3.5–5.0)
Alkaline Phosphatase: 45 U/L (ref 38–126)
Anion gap: 15 (ref 5–15)
BUN: 5 mg/dL — ABNORMAL LOW (ref 6–20)
CO2: 19 mmol/L — ABNORMAL LOW (ref 22–32)
Calcium: 9.8 mg/dL (ref 8.9–10.3)
Chloride: 105 mmol/L (ref 98–111)
Creatinine, Ser: 0.56 mg/dL (ref 0.44–1.00)
GFR, Estimated: >60 mL/min (ref >60)
Glucose, Bld: 62 mg/dL — ABNORMAL LOW (ref 70–99)
Potassium: 3.2 mmol/L — ABNORMAL LOW (ref 3.5–5.1)
Sodium: 139 mmol/L (ref 135–145)
Total Bilirubin: 1.1 mg/dL (ref 0.3–1.2)
Total Protein: 8.3 g/dL — ABNORMAL HIGH (ref 6.5–8.1)

## 2022-08-24 LAB — CBC
HCT: 42.2 % (ref 36.0–46.0)
Hemoglobin: 13.1 g/dL (ref 12.0–15.0)
MCH: 28.8 pg (ref 26.0–34.0)
MCHC: 31 g/dL (ref 30.0–36.0)
MCV: 92.7 fL (ref 80.0–100.0)
Platelets: 191 10*3/uL (ref 150–400)
RBC: 4.55 MIL/uL (ref 3.87–5.11)
RDW: 13.7 % (ref 11.5–15.5)
WBC: 10.6 10*3/uL — ABNORMAL HIGH (ref 4.0–10.5)
nRBC: 0 % (ref 0.0–0.2)

## 2022-08-24 MED ORDER — LACTATED RINGERS IV BOLUS
1000.0000 mL | Freq: Once | INTRAVENOUS | Status: AC
Start: 2022-08-24 — End: 2022-08-24
  Administered 2022-08-24: 1000 mL via INTRAVENOUS

## 2022-08-24 MED ORDER — FAMOTIDINE IN NACL 20-0.9 MG/50ML-% IV SOLN
20.0000 mg | Freq: Once | INTRAVENOUS | Status: AC
  Administered 2022-08-24: 20 mg via INTRAVENOUS
  Filled 2022-08-24: qty 50

## 2022-08-24 MED ORDER — LACTATED RINGERS IV BOLUS: 1000.0000 mL | Freq: Once | INTRAVENOUS | Status: DC

## 2022-08-24 MED ORDER — ONDANSETRON HCL 4 MG/2ML IJ SOLN
4.0000 mg | Freq: Once | INTRAMUSCULAR | Status: AC
Start: 2022-08-24 — End: 2022-08-24
  Administered 2022-08-24: 4 mg via INTRAVENOUS
  Filled 2022-08-24: qty 2

## 2022-08-24 MED ORDER — METOCLOPRAMIDE HCL 5 MG/ML IJ SOLN: 10.0000 mg | Freq: Once | INTRAMUSCULAR | Status: DC

## 2022-08-24 NOTE — MAU Note (Signed)
.  Bailey Bailey is a 31 y.o. at [redacted]w[redacted]d here in MAU reporting: n/v continues got worse yesterday.  Has been using nausea meds but they are not working.  Feels like she is dehydrated. Denies any pain or cramping. LMP:  Onset of complaint: yesterday Pain score: 0 There were no vitals filed for this visit.   FHT:152 Lab orders placed from triage:

## 2022-08-24 NOTE — MAU Provider Note (Signed)
History     CSN: [id number]  Arrival date and time: 08/24/22 1801   None     Chief Complaint  Patient presents with  . Emesis   HPI Bailey Bailey is a 31 y.o. [id number] at [redacted]w[redacted]d who presents to MAU for ongoing nausea and vomiting. Patient reports worsening nausea and vomiting for the last 2 days. She reports Scopolamine patch usually works, however has recently been breaking skin out behind her ears so she took it off and forgot to replace it. She reports she has a phenergan prescription however it makes her feels worse so has not used recently. She reports she is unable to keep down any solids or fluids. She denies abdominal pain or vaginal bleeding.   OB History     Gravida  6   Para  2   Term  2   Preterm  0   AB  3   Living  2      SAB  2   IAB  0   Ectopic  1   Multiple  0   Live Births  2           Past Medical History:  Diagnosis Date  . Anemia   . Anxiety    panic attacks  . Asthma    inhaler PRN- rarely uses  . History of postpartum depression    mild- no meds  . Hx of chlamydia infection 07/2013  . Hx of tooth extraction 2015    Past Surgical History:  Procedure Laterality Date  . ARTHROTOMY Right 01/14/2020   Procedure: ARTHROTOMY;  Surgeon: Bjorn Pippin, MD;  Location: [facility];  Service: Orthopedics;  Laterality: Right;  . CESAREAN SECTION  04/16/2012   Procedure: CESAREAN SECTION;  Surgeon: Meriel Pica, MD;  Location: [facility];  Service: Gynecology;  Laterality: N/A;  . CESAREAN SECTION N/A 08/03/2015   Procedure: CESAREAN SECTION;  Surgeon: Richarda Overlie, MD;  Location: [facility];  Service: Obstetrics;  Laterality: N/A;  Repeat edc 08/10/15 nkda  . HARDWARE REMOVAL Right 01/14/2020   Procedure: HARDWARE REMOVAL;  Surgeon: Bjorn Pippin, MD;  Location: [facility];  Service: Orthopedics;  Laterality: Right;  . ORIF HUMERUS FRACTURE Right 04/24/2019   Procedure: OPEN REDUCTION INTERNAL FIXATION  (ORIF) DISTAL HUMERUS FRACTURE;  Surgeon: Bjorn Pippin, MD;  Location: [facility];  Service: Orthopedics;  Laterality: Right;  . TONSILLECTOMY    . ULNAR NERVE TRANSPOSITION Right 01/14/2020   Procedure: RIGHT ELBOW ARTHROTOMY WITH CAPSULAR EXCISION , REMOVAL RETAINED HARDWARE, NEUROPLASTY AND TRANSPOSITION,ULNAR NERVE AT ELBOW;  Surgeon: Bjorn Pippin, MD;  Location: [facility];  Service: Orthopedics;  Laterality: Right;  BLOCK EXPAREL    Family History  Problem Relation Age of Onset  . Kidney disease Mother   . Migraines Mother   . Diabetes Father   . Breast cancer Father   . Heart disease Maternal Grandmother 65       CHF    Social History   Tobacco Use  . Smoking status: Never  . Smokeless tobacco: Never  Vaping Use  . Vaping Use: Never used  Substance Use Topics  . Alcohol use: No  . Drug use: No    Allergies: No Known Allergies  Medications Prior to Admission  Medication Sig Dispense Refill Last Dose  . Prenatal Vit-Fe Fumarate-FA (PRENATAL VITAMIN) 27-0.8 MG TABS Take 1 tablet by mouth daily. 30 tablet  08/24/2022  . promethazine (PHENERGAN) 12.5 MG  tablet Take 1 tablet (12.5 mg total) by mouth every 6 (six) hours as needed for nausea or vomiting. 30 tablet 0 08/24/2022  . scopolamine (TRANSDERM-SCOP) 1 MG/3DAYS Place 1 patch (1.5 mg total) onto the skin every 3 (three) days. 10 patch 0 08/24/2022  . scopolamine (TRANSDERM-SCOP) 1 MG/3DAYS Place 1 patch (1.5 mg total) onto the skin every 3 (three) days. 10 patch 1 08/24/2022  . famotidine (PEPCID) 20 MG tablet Take 1 tablet (20 mg total) by mouth 2 (two) times daily. (Patient not taking: Reported on 06/27/2022) 60 tablet 1   . hydrOXYzine (VISTARIL) 50 MG capsule Take 1 capsule (50 mg total) by mouth 3 (three) times daily as needed. 30 capsule 0   . progesterone (PROMETRIUM) 200 MG capsule Take 200 mg by mouth daily.       Review of Systems  Constitutional: Negative.   Respiratory: Negative.    Cardiovascular:  Negative.   Gastrointestinal:  Positive for nausea and vomiting.  Genitourinary: Negative.   Neurological: Negative.    Physical Exam   Blood pressure 120/74, pulse (!) 114, temperature 98.2 F (36.8 C), resp. rate 18, height 5\' 5"  (1.651 m), weight 89.4 kg, last menstrual period 10/01/2021, unknown if currently breastfeeding.  Physical Exam Vitals and nursing note reviewed.  Constitutional:      General: She is not in acute distress. Eyes:     Pupils: Pupils are equal, round, and reactive to light.  Cardiovascular:     Rate and Rhythm: Tachycardia present.  Pulmonary:     Effort: Pulmonary effort is normal.  Abdominal:     Palpations: Abdomen is soft.     Tenderness: There is no abdominal tenderness.  Musculoskeletal:        General: Normal range of motion.     Cervical back: Normal range of motion.  Skin:    General: Skin is warm and dry.  Neurological:     General: No focal deficit present.     Mental Status: She is alert and oriented to person, place, and time.  Psychiatric:        Mood and Affect: Mood normal.        Behavior: Behavior normal.        Judgment: Judgment normal.   FHR: 152 bpm via doppler  MAU Course  Procedures  MDM Patient unable to void for urine sample. Patient given 2L LR bolus with IV Zofran and Pepcid. CBC and CMP***  Assessment and Plan  ***   12/01/2021 08/24/2022, 9:53 PM

## 2022-08-25 DIAGNOSIS — Z3A15 15 weeks gestation of pregnancy: Secondary | ICD-10-CM

## 2022-08-25 DIAGNOSIS — O210 Mild hyperemesis gravidarum: Secondary | ICD-10-CM

## 2022-08-25 LAB — URINALYSIS, ROUTINE W REFLEX MICROSCOPIC
Bilirubin Urine: NEGATIVE
Glucose, UA: NEGATIVE mg/dL
Hgb urine dipstick: NEGATIVE
Ketones, ur: 80 mg/dL — AB
Nitrite: NEGATIVE
Protein, ur: 30 mg/dL — AB
Specific Gravity, Urine: 1.025 (ref 1.005–1.030)
pH: 6 (ref 5.0–8.0)

## 2022-08-25 MED ORDER — ONDANSETRON 4 MG PO TBDP: 4.0000 mg | ORAL_TABLET | Freq: Three times a day (TID) | ORAL | 0 refills | Status: DC | PRN

## 2022-08-25 NOTE — MAU Provider Note (Incomplete)
History     CSN: [id number]  Arrival date and time: 08/24/22 1801   None     Chief Complaint  Patient presents with  . Emesis   HPI Bailey Bailey is a 31 y.o. [id number] at [redacted]w[redacted]d who presents to MAU for ongoing nausea and vomiting. Patient reports worsening nausea and vomiting for the last 2 days. She reports Scopolamine patch usually works, however has recently been breaking skin out behind her ears so she took it off and forgot to replace it. She reports she has a phenergan prescription however it makes her feels worse so has not used recently. She reports she is unable to keep down any solids or fluids. She denies abdominal pain or vaginal bleeding.   OB History     Gravida  6   Para  2   Term  2   Preterm  0   AB  3   Living  2      SAB  2   IAB  0   Ectopic  1   Multiple  0   Live Births  2           Past Medical History:  Diagnosis Date  . Anemia   . Anxiety    panic attacks  . Asthma    inhaler PRN- rarely uses  . History of postpartum depression    mild- no meds  . Hx of chlamydia infection 07/2013  . Hx of tooth extraction 2015    Past Surgical History:  Procedure Laterality Date  . ARTHROTOMY Right 01/14/2020   Procedure: ARTHROTOMY;  Surgeon: Bjorn Pippin, MD;  Location: [facility];  Service: Orthopedics;  Laterality: Right;  . CESAREAN SECTION  04/16/2012   Procedure: CESAREAN SECTION;  Surgeon: Meriel Pica, MD;  Location: [facility];  Service: Gynecology;  Laterality: N/A;  . CESAREAN SECTION N/A 08/03/2015   Procedure: CESAREAN SECTION;  Surgeon: Richarda Overlie, MD;  Location: [facility];  Service: Obstetrics;  Laterality: N/A;  Repeat edc 08/10/15 nkda  . HARDWARE REMOVAL Right 01/14/2020   Procedure: HARDWARE REMOVAL;  Surgeon: Bjorn Pippin, MD;  Location: [facility];  Service: Orthopedics;  Laterality: Right;  . ORIF HUMERUS FRACTURE Right 04/24/2019   Procedure: OPEN REDUCTION INTERNAL FIXATION  (ORIF) DISTAL HUMERUS FRACTURE;  Surgeon: Bjorn Pippin, MD;  Location: [facility];  Service: Orthopedics;  Laterality: Right;  . TONSILLECTOMY    . ULNAR NERVE TRANSPOSITION Right 01/14/2020   Procedure: RIGHT ELBOW ARTHROTOMY WITH CAPSULAR EXCISION , REMOVAL RETAINED HARDWARE, NEUROPLASTY AND TRANSPOSITION,ULNAR NERVE AT ELBOW;  Surgeon: Bjorn Pippin, MD;  Location: [facility];  Service: Orthopedics;  Laterality: Right;  BLOCK EXPAREL    Family History  Problem Relation Age of Onset  . Kidney disease Mother   . Migraines Mother   . Diabetes Father   . Breast cancer Father   . Heart disease Maternal Grandmother 65       CHF    Social History   Tobacco Use  . Smoking status: Never  . Smokeless tobacco: Never  Vaping Use  . Vaping Use: Never used  Substance Use Topics  . Alcohol use: No  . Drug use: No    Allergies: No Known Allergies  Medications Prior to Admission  Medication Sig Dispense Refill Last Dose  . Prenatal Vit-Fe Fumarate-FA (PRENATAL VITAMIN) 27-0.8 MG TABS Take 1 tablet by mouth daily. 30 tablet  08/24/2022  . promethazine (PHENERGAN) 12.5 MG  tablet Take 1 tablet (12.5 mg total) by mouth every 6 (six) hours as needed for nausea or vomiting. 30 tablet 0 08/24/2022  . scopolamine (TRANSDERM-SCOP) 1 MG/3DAYS Place 1 patch (1.5 mg total) onto the skin every 3 (three) days. 10 patch 0 08/24/2022  . scopolamine (TRANSDERM-SCOP) 1 MG/3DAYS Place 1 patch (1.5 mg total) onto the skin every 3 (three) days. 10 patch 1 08/24/2022  . famotidine (PEPCID) 20 MG tablet Take 1 tablet (20 mg total) by mouth 2 (two) times daily. (Patient not taking: Reported on 06/27/2022) 60 tablet 1   . hydrOXYzine (VISTARIL) 50 MG capsule Take 1 capsule (50 mg total) by mouth 3 (three) times daily as needed. 30 capsule 0   . progesterone (PROMETRIUM) 200 MG capsule Take 200 mg by mouth daily.       Review of Systems  Constitutional: Negative.   Respiratory: Negative.    Cardiovascular:  Negative.   Gastrointestinal:  Positive for nausea and vomiting.  Genitourinary: Negative.   Neurological: Negative.    Physical Exam   Blood pressure 120/74, pulse (!) 114, temperature 98.2 F (36.8 C), resp. rate 18, height 5\' 5"  (1.651 m), weight 89.4 kg, last menstrual period 10/01/2021, unknown if currently breastfeeding.  Physical Exam Vitals and nursing note reviewed.  Constitutional:      General: She is not in acute distress. Eyes:     Pupils: Pupils are equal, round, and reactive to light.  Cardiovascular:     Rate and Rhythm: Tachycardia present.  Pulmonary:     Effort: Pulmonary effort is normal.  Abdominal:     Palpations: Abdomen is soft.     Tenderness: There is no abdominal tenderness.  Musculoskeletal:        General: Normal range of motion.     Cervical back: Normal range of motion.  Skin:    General: Skin is warm and dry.  Neurological:     General: No focal deficit present.     Mental Status: She is alert and oriented to person, place, and time.  Psychiatric:        Mood and Affect: Mood normal.        Behavior: Behavior normal.        Judgment: Judgment normal.   FHR: 152 bpm via doppler  MAU Course  Procedures  MDM Patient unable to void for urine sample on arrival so was given 2L LR bolus with IV Zofran and Pepcid. CBC and CMP stable. Patient tolerated PO.   Assessment and Plan  ***   12/01/2021, CNM 08/24/2022, 9:53 PM

## 2022-09-09 ENCOUNTER — Inpatient Hospital Stay (HOSPITAL_COMMUNITY)
Admission: AD | Admit: 2022-09-09 | Discharge: 2022-09-09 | Disposition: A | Discharge status: Home / Self Care | Payer: Medicaid Other | Attending: Obstetrics and Gynecology | Admitting: Obstetrics and Gynecology

## 2022-09-09 ENCOUNTER — Other Ambulatory Visit: Payer: Self-pay

## 2022-09-09 ENCOUNTER — Encounter (HOSPITAL_COMMUNITY): Payer: Self-pay | Admitting: Obstetrics and Gynecology

## 2022-09-09 DIAGNOSIS — O99512 Diseases of the respiratory system complicating pregnancy, second trimester: Secondary | ICD-10-CM | POA: Diagnosis present

## 2022-09-09 DIAGNOSIS — O210 Mild hyperemesis gravidarum: Secondary | ICD-10-CM | POA: Insufficient documentation

## 2022-09-09 DIAGNOSIS — Z3A17 17 weeks gestation of pregnancy: Secondary | ICD-10-CM | POA: Insufficient documentation

## 2022-09-09 DIAGNOSIS — O99342 Other mental disorders complicating pregnancy, second trimester: Secondary | ICD-10-CM | POA: Insufficient documentation

## 2022-09-09 LAB — URINALYSIS, ROUTINE W REFLEX MICROSCOPIC
Bilirubin Urine: NEGATIVE
Glucose, UA: NEGATIVE mg/dL
Hgb urine dipstick: NEGATIVE
Ketones, ur: 80 mg/dL — AB
Nitrite: NEGATIVE
Protein, ur: 100 mg/dL — AB
Specific Gravity, Urine: 1.028 (ref 1.005–1.030)
pH: 5 (ref 5.0–8.0)

## 2022-09-09 MED ORDER — SCOPOLAMINE 1 MG/3DAYS TD PT72
1.0000 | MEDICATED_PATCH | Freq: Once | TRANSDERMAL | Status: DC
  Administered 2022-09-09: 1.5 mg via TRANSDERMAL
  Filled 2022-09-09: qty 1

## 2022-09-09 MED ORDER — PROMETHAZINE HCL 25 MG PO TABS
25.0000 mg | ORAL_TABLET | Freq: Once | ORAL | Status: AC
  Administered 2022-09-09: 25 mg via ORAL
  Filled 2022-09-09: qty 1

## 2022-09-09 MED ORDER — LACTATED RINGERS IV BOLUS
1000.0000 mL | Freq: Once | INTRAVENOUS | Status: AC
  Administered 2022-09-09: 1000 mL via INTRAVENOUS

## 2022-09-09 MED ORDER — POLYETHYLENE GLYCOL 3350 17 GM/SCOOP PO POWD: 17.0000 g | Freq: Every day | ORAL | 2 refills | Status: DC | PRN

## 2022-09-09 MED ORDER — PROMETHAZINE HCL 12.5 MG PO TABS: 25.0000 mg | ORAL_TABLET | Freq: Four times a day (QID) | ORAL | 3 refills | Status: DC | PRN

## 2022-09-09 MED ORDER — SODIUM CHLORIDE 0.9 % IV SOLN
8.0000 mg | Freq: Once | INTRAVENOUS | Status: AC
  Administered 2022-09-09: 8 mg via INTRAVENOUS
  Filled 2022-09-09: qty 4

## 2022-09-09 MED ORDER — ONDANSETRON HCL 8 MG PO TABS: 8.0000 mg | ORAL_TABLET | Freq: Three times a day (TID) | ORAL | 3 refills | Status: DC | PRN

## 2022-09-09 MED ORDER — SCOPOLAMINE 1 MG/3DAYS TD PT72: 1.0000 | MEDICATED_PATCH | TRANSDERMAL | 6 refills | Status: DC

## 2022-09-09 NOTE — MAU Provider Note (Addendum)
Chief Complaint: Nausea   Event Date/Time   First Provider Initiated Contact with Patient 09/09/22 0954     SUBJECTIVE HPI: Bailey Bailey is a 31 y.o. [id number] at 5842w5d who presents to Maternity Admissions reporting exacerbation of hyperemesis. Scopolamine patches work well but pt has has mod-severe skin irritation behind both ears and cannot tolerate having them there. Tried p[lacing one on on her wrist and has similar reaction and then developed a fluid-filled lesion. Resolved with removing patch and applying neosporin. Zofran helps in the hospital but not when she takes it at home. ODT tastes very bad. Phenergan not very helpful. Upon review of previous MAU visits, pt has Rx for phenergan 12.5 mg and Zofran 4 mg. Asked pt if anyone had recommended higher doses and she said no one had.   Associated signs and symptoms: Neg for fever, chills, abd pain, VB.   Past Medical History:  Diagnosis Date   Anemia    Anxiety    panic attacks   Asthma    inhaler PRN- rarely uses   History of postpartum depression    mild- no meds   Hx of chlamydia infection 07/2013   Hx of tooth extraction 2015   OB History  Gravida Para Term Preterm AB Living  6 2 2  0 3 2  SAB IAB Ectopic Multiple Live Births  2 0 1 0 2    # Outcome Date GA Lbr Len/2nd Weight Sex Delivery Anes PTL Lv  6 Current           5 SAB 12/04/21 [redacted]w[redacted]d         4 Ectopic 05/2021          3 Term 08/03/15 3724w0d  2770 g F CS-LTranv Spinal  LIV  2 Term 04/16/12 6667w0d  3465 g F CS-LTranv EPI  LIV  1 SAB            Past Surgical History:  Procedure Laterality Date   ARTHROTOMY Right 01/14/2020   Procedure: ARTHROTOMY;  Surgeon: Bjorn PippinVarkey, Dax T, MD;  Location: [facility];  Service: Orthopedics;  Laterality: Right;   CESAREAN SECTION  04/16/2012   Procedure: CESAREAN SECTION;  Surgeon: Meriel Picaichard M Holland, MD;  Location: [facility];  Service: Gynecology;  Laterality: N/A;   CESAREAN SECTION N/A 08/03/2015   Procedure: CESAREAN  SECTION;  Surgeon: Richarda Overlieichard Holland, MD;  Location: [facility];  Service: Obstetrics;  Laterality: N/A;  Repeat edc 08/10/15 nkda   HARDWARE REMOVAL Right 01/14/2020   Procedure: HARDWARE REMOVAL;  Surgeon: Bjorn PippinVarkey, Dax T, MD;  Location: [facility];  Service: Orthopedics;  Laterality: Right;   ORIF HUMERUS FRACTURE Right 04/24/2019   Procedure: OPEN REDUCTION INTERNAL FIXATION (ORIF) DISTAL HUMERUS FRACTURE;  Surgeon: Bjorn PippinVarkey, Dax T, MD;  Location: [facility];  Service: Orthopedics;  Laterality: Right;   TONSILLECTOMY     ULNAR NERVE TRANSPOSITION Right 01/14/2020   Procedure: RIGHT ELBOW ARTHROTOMY WITH CAPSULAR EXCISION , REMOVAL RETAINED HARDWARE, NEUROPLASTY AND TRANSPOSITION,ULNAR NERVE AT ELBOW;  Surgeon: Bjorn PippinVarkey, Dax T, MD;  Location: [facility];  Service: Orthopedics;  Laterality: Right;  BLOCK EXPAREL   Social History   Socioeconomic History   Marital status: Single    Spouse name: Not on file   Number of children: Not on file   Years of education: Not on file   Highest education level: Not on file  Occupational History   Not on file  Tobacco Use   Smoking status: Never  Smokeless tobacco: Never  Vaping Use   Vaping Use: Never used  Substance and Sexual Activity   Alcohol use: No   Drug use: No   Sexual activity: Not Currently  Other Topics Concern   Not on file  Social History Narrative   Not on file   Social Determinants of Health   Financial Resource Strain: Not on file  Food Insecurity: Not on file  Transportation Needs: Not on file  Physical Activity: Not on file  Stress: Not on file  Social Connections: Not on file  Intimate Partner Violence: Not on file   Family History  Problem Relation Age of Onset   Kidney disease Mother    Migraines Mother    Diabetes Father    Breast cancer Father    Heart disease Maternal Grandmother 57       CHF   No current facility-administered medications on file prior to encounter.   Current  Outpatient Medications on File Prior to Encounter  Medication Sig Dispense Refill   ondansetron (ZOFRAN-ODT) 4 MG disintegrating tablet Take 1 tablet (4 mg total) by mouth every 8 (eight) hours as needed for nausea or vomiting. 15 tablet 0   famotidine (PEPCID) 20 MG tablet Take 1 tablet (20 mg total) by mouth 2 (two) times daily. (Patient not taking: Reported on 06/27/2022) 60 tablet 1   hydrOXYzine (VISTARIL) 50 MG capsule Take 1 capsule (50 mg total) by mouth 3 (three) times daily as needed. 30 capsule 0   Prenatal Vit-Fe Fumarate-FA (PRENATAL VITAMIN) 27-0.8 MG TABS Take 1 tablet by mouth daily. 30 tablet    progesterone (PROMETRIUM) 200 MG capsule Take 200 mg by mouth daily.     scopolamine (TRANSDERM-SCOP) 1 MG/3DAYS Place 1 patch (1.5 mg total) onto the skin every 3 (three) days. 10 patch 1   No Known Allergies  I have reviewed patient's Past Medical Hx, Surgical Hx, Family Hx, Social Hx, medications and allergies.   Review of Systems  Constitutional:  Negative for chills and fever.  Gastrointestinal:  Positive for constipation (Last BM 2 days ago), nausea and vomiting. Negative for abdominal pain and diarrhea.  Genitourinary:  Negative for vaginal bleeding.    OBJECTIVE Patient Vitals for the past 24 hrs:  BP Temp Temp src Pulse Resp SpO2 Height Weight  09/09/22 1238 (!) 99/53 -- -- 80 -- -- -- --  09/09/22 1144 (!) 93/47 -- -- 83 -- -- -- --  09/09/22 0932 (!) 108/54 98.2 F (36.8 C) Oral (!) 105 18 100 % -- --  09/09/22 0927 -- -- -- -- -- -- 5\' 5"  (1.651 m) 89.8 kg   Constitutional: Well-developed, well-nourished female in no acute distress. Vomiting in MAU.  Mucus membranes dry Cardiovascular: Mildly tachycardic Respiratory: normal rate and effort.  GI: Deferred Neurologic: Alert and oriented x 4.  GU: Deferred  FHR 142 by doppler  LAB RESULTS Results for orders placed or performed during the hospital encounter of 09/09/22 (from the past 24 hour(s))  Urinalysis,  Routine w reflex microscopic Urine, Clean Catch     Status: Abnormal   Collection Time: 09/09/22 11:43 AM  Result Value Ref Range   Color, Urine AMBER (A) YELLOW   APPearance CLOUDY (A) CLEAR   Specific Gravity, Urine 1.028 1.005 - 1.030   pH 5.0 5.0 - 8.0   Glucose, UA NEGATIVE NEGATIVE mg/dL   Hgb urine dipstick NEGATIVE NEGATIVE   Bilirubin Urine NEGATIVE NEGATIVE   Ketones, ur 80 (A) NEGATIVE mg/dL   Protein,  ur 100 (A) NEGATIVE mg/dL   Nitrite NEGATIVE NEGATIVE   Leukocytes,Ua TRACE (A) NEGATIVE   RBC / HPF 0-5 0 - 5 RBC/hpf   WBC, UA 0-5 0 - 5 WBC/hpf   Bacteria, UA RARE (A) NONE SEEN   Squamous Epithelial / LPF 21-50 0 - 5   Mucus PRESENT    Budding Yeast PRESENT     IMAGING No results found.  MAU COURSE Orders Placed This Encounter  Procedures   Urinalysis, Routine w reflex microscopic Urine, Clean Catch   Insert peripheral IV   Discharge patient   Meds ordered this encounter  Medications   lactated ringers bolus 1,000 mL   ondansetron (ZOFRAN) 8 mg in sodium chloride 0.9 % 50 mL IVPB   scopolamine (TRANSDERM-SCOP) 1 MG/3DAYS 1.5 mg   promethazine (PHENERGAN) tablet 25 mg   promethazine (PHENERGAN) 12.5 MG tablet    Sig: Take 2 tablets (25 mg total) by mouth every 6 (six) hours as needed for nausea or vomiting. May be taken orally, vaginally or rectally    Dispense:  30 tablet    Refill:  3    Order Specific Question:   Supervising Provider    Answer:   Elonda Husky, LUTHER H [[id number]]   scopolamine (TRANSDERM-SCOP) 1 MG/3DAYS    Sig: Place 1 patch (1.5 mg total) onto the skin every 3 (three) days.    Dispense:  10 patch    Refill:  6    Order Specific Question:   Supervising Provider    Answer:   Elonda Husky, LUTHER H [[id number]]   ondansetron (ZOFRAN) 8 MG tablet    Sig: Take 1 tablet (8 mg total) by mouth every 8 (eight) hours as needed for nausea or vomiting.    Dispense:  30 tablet    Refill:  3    Order Specific Question:   Supervising Provider    Answer:   Tania Ade  H [[id number]]   polyethylene glycol powder (GLYCOLAX/MIRALAX) 17 GM/SCOOP powder    Sig: Take 17 g by mouth daily as needed for moderate constipation.    Dispense:  500 g    Refill:  2    Order Specific Question:   Supervising Provider    Answer:   Elonda Husky, LUTHER H [[id number]]   [place] concentrated, + ketones.  1150: Tolerating ice and bite of cracker but still feeling like she will vomit. Wants to try Phenergan. Doesn't think tablet will stay down orally. Wants to try vaginally.    1240: Feeling better. Tolerating PO's. Feels ready to go home.   MDM - Exacerbation of hyperemesis usually controlled w/ Scopolamine patch but ran out due to patches not staying on. Also having significant skin irritation behind ears. Mild improvement w/ Zafran and Phenergan prio to today but pt noted to be taking lower doses. Of each.   - Per consult w/ Dr. Nehemiah Settle, can place Scopolamine patches on anterior and posterior shoulders. Rotate sights.   - Increased Zofran to 8 mg Q8. Rx Miralax for constipation  - Increased Phenergan to 25 mg dose. Instructed her that she may use it orally, vaginally or rectally.   ASSESSMENT 1. Hyperemesis complicating pregnancy, antepartum     PLAN Discharge home in stable condition. Hyperemesis precautions  Follow-up Information     [place] Follow up.   Contact information: [place] [address] [phone]         [facility] Follow up.  Specialty: Obstetrics and Gynecology Why: As needed if symptoms worsen Contact information: [address] [id number] [place]c [place] [address] [phone]               Allergies as of 09/09/2022   No Known Allergies      Medication List     STOP taking these medications    ondansetron 4 MG disintegrating tablet Commonly known as: ZOFRAN-ODT       TAKE these medications    famotidine 20 MG tablet Commonly known as:  PEPCID Take 1 tablet (20 mg total) by mouth 2 (two) times daily.   hydrOXYzine 50 MG capsule Commonly known as: Vistaril Take 1 capsule (50 mg total) by mouth 3 (three) times daily as needed.   ondansetron 8 MG tablet Commonly known as: ZOFRAN Take 1 tablet (8 mg total) by mouth every 8 (eight) hours as needed for nausea or vomiting.   polyethylene glycol powder 17 GM/SCOOP powder Commonly known as: GLYCOLAX/MIRALAX Take 17 g by mouth daily as needed for moderate constipation.   Prenatal Vitamin 27-0.8 MG Tabs Take 1 tablet by mouth daily.   progesterone 200 MG capsule Commonly known as: PROMETRIUM Take 200 mg by mouth daily.   promethazine 12.5 MG tablet Commonly known as: PHENERGAN Take 2 tablets (25 mg total) by mouth every 6 (six) hours as needed for nausea or vomiting. May be taken orally, vaginally or rectally What changed:  how much to take additional instructions   scopolamine 1 MG/3DAYS Commonly known as: TRANSDERM-SCOP Place 1 patch (1.5 mg total) onto the skin every 3 (three) days. What changed: Another medication with the same name was removed. Continue taking this medication, and follow the directions you see here.         Katrinka Blazing, [place], [place] 09/09/2022  12:52 PM

## 2022-09-09 NOTE — MAU Note (Signed)
Bailey Bailey is a 31 y.o. at [redacted]w[redacted]d here in MAU reporting: started vomiting yesterday. Took zofran last night around 2100 with no relief. Is out of her scop patches. No pain. No bleeding or LOF.   Onset of complaint: yesterday  Pain score: 0/10  Vitals:   09/09/22 0932  BP: (!) 108/54  Pulse: (!) 105  Resp: 18  Temp: 98.2 F (36.8 C)  SpO2: 100%     FHT:142  Lab orders placed from triage: UA

## 2022-09-11 ENCOUNTER — Inpatient Hospital Stay (HOSPITAL_COMMUNITY)
Admission: AD | Admit: 2022-09-11 | Discharge: 2022-09-11 | Disposition: A | Discharge status: Home / Self Care | Payer: Medicaid Other | Attending: Obstetrics & Gynecology | Admitting: Obstetrics & Gynecology

## 2022-09-11 ENCOUNTER — Encounter (HOSPITAL_COMMUNITY): Payer: Self-pay | Admitting: Obstetrics & Gynecology

## 2022-09-11 DIAGNOSIS — B3731 Acute candidiasis of vulva and vagina: Secondary | ICD-10-CM

## 2022-09-11 DIAGNOSIS — O98812 Other maternal infectious and parasitic diseases complicating pregnancy, second trimester: Secondary | ICD-10-CM | POA: Diagnosis not present

## 2022-09-11 DIAGNOSIS — Z3A18 18 weeks gestation of pregnancy: Secondary | ICD-10-CM | POA: Diagnosis not present

## 2022-09-11 DIAGNOSIS — O26892 Other specified pregnancy related conditions, second trimester: Secondary | ICD-10-CM | POA: Insufficient documentation

## 2022-09-11 DIAGNOSIS — O99512 Diseases of the respiratory system complicating pregnancy, second trimester: Secondary | ICD-10-CM | POA: Diagnosis not present

## 2022-09-11 DIAGNOSIS — R109 Unspecified abdominal pain: Secondary | ICD-10-CM | POA: Insufficient documentation

## 2022-09-11 DIAGNOSIS — O209 Hemorrhage in early pregnancy, unspecified: Secondary | ICD-10-CM | POA: Diagnosis present

## 2022-09-11 DIAGNOSIS — O99342 Other mental disorders complicating pregnancy, second trimester: Secondary | ICD-10-CM | POA: Diagnosis not present

## 2022-09-11 DIAGNOSIS — O23592 Infection of other part of genital tract in pregnancy, second trimester: Secondary | ICD-10-CM | POA: Diagnosis not present

## 2022-09-11 LAB — URINALYSIS, ROUTINE W REFLEX MICROSCOPIC
Bilirubin Urine: NEGATIVE
Glucose, UA: NEGATIVE mg/dL
Ketones, ur: 80 mg/dL — AB
Nitrite: NEGATIVE
Protein, ur: 30 mg/dL — AB
Specific Gravity, Urine: 1.02 (ref 1.005–1.030)
pH: 5 (ref 5.0–8.0)

## 2022-09-11 LAB — WET PREP, GENITAL
Clue Cells Wet Prep HPF POC: NONE SEEN
Sperm: NONE SEEN
Trich, Wet Prep: NONE SEEN
WBC, Wet Prep HPF POC: >=10 — AB (ref <10)

## 2022-09-11 MED ORDER — TERCONAZOLE 0.4 % VA CREA: 1.0000 | TOPICAL_CREAM | Freq: Every day | VAGINAL | 0 refills | Status: DC

## 2022-09-11 NOTE — MAU Provider Note (Signed)
History     CSN: [id number]  Arrival date and time: 09/11/22 0307   Event Date/Time   First Provider Initiated Contact with Patient 09/11/22 0402      Chief Complaint  Patient presents with   Vaginal Bleeding   Abdominal Pain   31 y.o. [id number] @18 .0 wks presenting with spotting and LAP. Reports feeling a cramp that woke her around 0200. She then saw pink when she went to the BR and wiped. Pain has now subsided and no longer bleeding. Denies recent IC. Reports using Phenergan tablet in her vagina yesterday, later felt burning and tried to remove the pill but it had dissolved.     OB History     Gravida  6   Para  2   Term  2   Preterm  0   AB  3   Living  2      SAB  2   IAB  0   Ectopic  1   Multiple  0   Live Births  2           Past Medical History:  Diagnosis Date   Anemia    Anxiety    panic attacks   Asthma    inhaler PRN- rarely uses   History of postpartum depression    mild- no meds   Hx of chlamydia infection 07/2013   Hx of tooth extraction 2015    Past Surgical History:  Procedure Laterality Date   ARTHROTOMY Right 01/14/2020   Procedure: ARTHROTOMY;  Surgeon: Hiram Gash, MD;  Location: [place];  Service: Orthopedics;  Laterality: Right;   CESAREAN SECTION  04/16/2012   Procedure: CESAREAN SECTION;  Surgeon: Margarette Asal, MD;  Location: [facility];  Service: Gynecology;  Laterality: N/A;   CESAREAN SECTION N/A 08/03/2015   Procedure: CESAREAN SECTION;  Surgeon: Molli Posey, MD;  Location: [facility];  Service: Obstetrics;  Laterality: N/A;  Repeat edc 08/10/15 nkda   HARDWARE REMOVAL Right 01/14/2020   Procedure: HARDWARE REMOVAL;  Surgeon: Hiram Gash, MD;  Location: [place];  Service: Orthopedics;  Laterality: Right;   ORIF HUMERUS FRACTURE Right 04/24/2019   Procedure: OPEN REDUCTION INTERNAL FIXATION (ORIF) DISTAL HUMERUS FRACTURE;  Surgeon: Hiram Gash, MD;  Location: [place];   Service: Orthopedics;  Laterality: Right;   TONSILLECTOMY     ULNAR NERVE TRANSPOSITION Right 01/14/2020   Procedure: RIGHT ELBOW ARTHROTOMY WITH CAPSULAR EXCISION , REMOVAL RETAINED HARDWARE, NEUROPLASTY AND TRANSPOSITION,ULNAR NERVE AT ELBOW;  Surgeon: Hiram Gash, MD;  Location: [place];  Service: Orthopedics;  Laterality: Right;  BLOCK EXPAREL    Family History  Problem Relation Age of Onset   Kidney disease Mother    Migraines Mother    Diabetes Father    Breast cancer Father    Heart disease Maternal Grandmother 62       CHF    Social History   Tobacco Use   Smoking status: Never   Smokeless tobacco: Never  Vaping Use   Vaping Use: Never used  Substance Use Topics   Alcohol use: No   Drug use: No    Allergies: No Known Allergies  Medications Prior to Admission  Medication Sig Dispense Refill Last Dose   famotidine (PEPCID) 20 MG tablet Take 1 tablet (20 mg total) by mouth 2 (two) times daily. (Patient not taking: Reported on 06/27/2022) 60 tablet 1    hydrOXYzine (VISTARIL) 50 MG capsule Take 1  capsule (50 mg total) by mouth 3 (three) times daily as needed. 30 capsule 0    ondansetron (ZOFRAN) 8 MG tablet Take 1 tablet (8 mg total) by mouth every 8 (eight) hours as needed for nausea or vomiting. 30 tablet 3 09/09/2022   polyethylene glycol powder (GLYCOLAX/MIRALAX) 17 GM/SCOOP powder Take 17 g by mouth daily as needed for moderate constipation. 500 g 2    Prenatal Vit-Fe Fumarate-FA (PRENATAL VITAMIN) 27-0.8 MG TABS Take 1 tablet by mouth daily. 30 tablet     progesterone (PROMETRIUM) 200 MG capsule Take 200 mg by mouth daily.      promethazine (PHENERGAN) 12.5 MG tablet Take 2 tablets (25 mg total) by mouth every 6 (six) hours as needed for nausea or vomiting. May be taken orally, vaginally or rectally 30 tablet 3 09/09/2022   scopolamine (TRANSDERM-SCOP) 1 MG/3DAYS Place 1 patch (1.5 mg total) onto the skin every 3 (three) days. 10 patch 6     Review  of Systems  Gastrointestinal:  Positive for abdominal pain.  Genitourinary:  Positive for vaginal bleeding.   Physical Exam   Blood pressure 103/67, pulse 86, temperature 98.2 F (36.8 C), temperature source Oral, resp. rate 18, height 5\' 5"  (1.651 m), weight 91 kg, last menstrual period 10/01/2021, SpO2 99 %, unknown if currently breastfeeding.  Physical Exam Constitutional:      General: She is in acute distress (tearful).     Appearance: She is well-developed.  HENT:     Head: Normocephalic and atraumatic.  Cardiovascular:     Rate and Rhythm: Normal rate.  Pulmonary:     Effort: Pulmonary effort is normal. No respiratory distress.  Abdominal:     General: There is no distension.     Palpations: Abdomen is soft. There is no mass.     Tenderness: There is no abdominal tenderness. There is no guarding or rebound.     Hernia: No hernia is present.     Comments: U/2  Genitourinary:    Comments: VE: closed/long Musculoskeletal:        General: Normal range of motion.     Cervical back: Normal range of motion.  Skin:    General: Skin is warm and dry.  Neurological:     General: No focal deficit present.     Mental Status: She is alert and oriented to person, place, and time.  Psychiatric:        Mood and Affect: Mood is anxious.        Behavior: Behavior normal.   FHT 145  Results for orders placed or performed during the hospital encounter of 09/11/22 (from the past 24 hour(s))  Wet prep, genital     Status: Abnormal   Collection Time: 09/11/22  4:07 AM  Result Value Ref Range   Yeast Wet Prep HPF POC PRESENT (A) NONE SEEN   Trich, Wet Prep NONE SEEN NONE SEEN   Clue Cells Wet Prep HPF POC NONE SEEN NONE SEEN   WBC, Wet Prep HPF POC >=10 (A) <10   Sperm NONE SEEN   Urinalysis, Routine w reflex microscopic     Status: Abnormal   Collection Time: 09/11/22  4:56 AM  Result Value Ref Range   Color, Urine AMBER (A) YELLOW   APPearance CLOUDY (A) CLEAR   Specific  Gravity, Urine 1.020 1.005 - 1.030   pH 5.0 5.0 - 8.0   Glucose, UA NEGATIVE NEGATIVE mg/dL   Hgb urine dipstick MODERATE (A) NEGATIVE   Bilirubin Urine  NEGATIVE NEGATIVE   Ketones, ur 80 (A) NEGATIVE mg/dL   Protein, ur 30 (A) NEGATIVE mg/dL   Nitrite NEGATIVE NEGATIVE   Leukocytes,Ua LARGE (A) NEGATIVE   RBC / HPF 21-50 0 - 5 RBC/hpf   WBC, UA 21-50 0 - 5 WBC/hpf   Bacteria, UA MANY (A) NONE SEEN   Squamous Epithelial / LPF 21-50 0 - 5   Mucus PRESENT    Budding Yeast PRESENT    MAU Course  Procedures  MDM Labs ordered and reviewed. UA with bacteria and WBCs but contaminated, will send culture. Treat for yeast, likely cause of spotting. Pt very anxious, admits to many MAU visits because she is worried about the well being of her baby. She reports her family has told her not to use medication for it and blamed her for her previous child having ADHD d/t using anxiety meds during pregnancy. 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 I encouraged pt to seek help such as psychotherapy and/or medication for anxiety. Stable for discharge home.   Assessment and Plan   1. [redacted] weeks gestation of pregnancy   2. Yeast vaginitis    Discharge home Follow up at [facility] as scheduled Return precautions Rx Terazol  Allergies as of 09/11/2022   No Known Allergies      Medication List     STOP taking these medications    famotidine 20 MG tablet Commonly known as: PEPCID   progesterone 200 MG capsule Commonly known as: PROMETRIUM       TAKE these medications    hydrOXYzine 50 MG capsule Commonly known as: Vistaril Take 1 capsule (50 mg total) by mouth 3 (three) times daily as needed.   ondansetron 8 MG tablet Commonly known as: ZOFRAN Take 1 tablet (8 mg total) by mouth every 8 (eight) hours as needed for nausea or vomiting.   polyethylene glycol powder 17 GM/SCOOP powder Commonly known as: GLYCOLAX/MIRALAX Take 17 g by mouth daily as needed for moderate constipation.    Prenatal Vitamin 27-0.8 MG Tabs Take 1 tablet by mouth daily.   promethazine 12.5 MG tablet Commonly known as: PHENERGAN Take 2 tablets (25 mg total) by mouth every 6 (six) hours as needed for nausea or vomiting. May be taken orally, vaginally or rectally   scopolamine 1 MG/3DAYS Commonly known as: TRANSDERM-SCOP Place 1 patch (1.5 mg total) onto the skin every 3 (three) days.   terconazole 0.4 % vaginal cream Commonly known as: TERAZOL 7 Place 1 applicator vaginally at bedtime.        Julianne Handler, CNM 09/11/2022, 5:20 AM

## 2022-09-11 NOTE — MAU Note (Signed)
.  Bailey Bailey is a 31 y.o. at [redacted]w[redacted]d here in MAU reporting: pinkish VB spotting with wiping, none currently when using the restroom in MAU, and ABD cramping that woke her up around 0200, but no pain currently. Pt reports using the prescribed medication Sunday in MAU, and it caused burning shortly after d/c and pt stated she showered the burning sensation subsided. Pt denies LOF and abnormal discharge. Pt states she is very anxious.  Denies recent intercourse  Onset of complaint: 0200 Pain score: 0/10 Vitals:   09/11/22 0331  BP: 103/67  Pulse: 86  Resp: 18  Temp: 98.2 F (36.8 C)  SpO2: 99%     FHT:145 Lab orders placed from triage:  ua

## 2022-09-12 LAB — CULTURE, OB URINE: Culture: 70000 — AB

## 2022-09-12 LAB — GC/CHLAMYDIA PROBE AMP (~~LOC~~) NOT AT ARMC
Chlamydia: NEGATIVE
Comment: NEGATIVE
Comment: NORMAL
Neisseria Gonorrhea: NEGATIVE

## 2022-09-13 ENCOUNTER — Encounter (HOSPITAL_COMMUNITY): Payer: Self-pay | Admitting: Obstetrics and Gynecology

## 2022-09-13 ENCOUNTER — Inpatient Hospital Stay (HOSPITAL_COMMUNITY)
Admission: AD | Admit: 2022-09-13 | Discharge: 2022-09-14 | Disposition: A | Discharge status: Home / Self Care | Payer: Medicaid Other | Attending: Obstetrics and Gynecology | Admitting: Obstetrics and Gynecology

## 2022-09-13 DIAGNOSIS — Z3A18 18 weeks gestation of pregnancy: Secondary | ICD-10-CM | POA: Insufficient documentation

## 2022-09-13 DIAGNOSIS — R109 Unspecified abdominal pain: Secondary | ICD-10-CM | POA: Diagnosis not present

## 2022-09-13 DIAGNOSIS — O26892 Other specified pregnancy related conditions, second trimester: Secondary | ICD-10-CM | POA: Insufficient documentation

## 2022-09-13 DIAGNOSIS — K5909 Other constipation: Secondary | ICD-10-CM | POA: Diagnosis not present

## 2022-09-13 DIAGNOSIS — O210 Mild hyperemesis gravidarum: Secondary | ICD-10-CM | POA: Insufficient documentation

## 2022-09-13 DIAGNOSIS — O99612 Diseases of the digestive system complicating pregnancy, second trimester: Secondary | ICD-10-CM | POA: Diagnosis not present

## 2022-09-13 DIAGNOSIS — K5903 Drug induced constipation: Secondary | ICD-10-CM

## 2022-09-13 MED ORDER — LACTATED RINGERS IV BOLUS
1000.0000 mL | Freq: Once | INTRAVENOUS | Status: AC
  Administered 2022-09-13: 1000 mL via INTRAVENOUS

## 2022-09-13 MED ORDER — FAMOTIDINE IN NACL 20-0.9 MG/50ML-% IV SOLN
20.0000 mg | Freq: Once | INTRAVENOUS | Status: AC
  Administered 2022-09-13: 20 mg via INTRAVENOUS
  Filled 2022-09-13: qty 50

## 2022-09-13 MED ORDER — SORBITOL 70 % SOLN
960.0000 mL | TOPICAL_OIL | Freq: Once | ORAL | Status: AC
  Administered 2022-09-13: 960 mL via RECTAL
  Filled 2022-09-13: qty 240

## 2022-09-13 MED ORDER — SODIUM CHLORIDE 0.9 % IV SOLN
25.0000 mg | Freq: Once | INTRAVENOUS | Status: AC
  Administered 2022-09-13: 25 mg via INTRAVENOUS
  Filled 2022-09-13: qty 1

## 2022-09-13 NOTE — MAU Note (Addendum)
.  Bailey Bailey is a 31 y.o. at [redacted]w[redacted]d here in MAU reporting: severe cramping and N/V . Pt report "she has vomiting more times than she can count". Pt states she has taken both of the her antiemetic medications today, and the Zofran is the only thing that helped. Pt not able to eat or drink, just Pedialyte. Pt reports the cramping started after she took the medication, intermittent cramping that gets more intense and sharp. Pt states she is very weak. Pt also reports It has been over a week since her last bowel movement, but she feels like she has to have one. PT denies VB, LOF, recent intercourse, and abnormal discharge.   Onset of complaint: yesterday  Pain score: 10/10 Vitals:   09/13/22 2153  BP: 108/62  Pulse: (!) 105  Resp: 18  Temp: 98.4 F (36.9 C)  SpO2: 97%     FHT:143 Lab orders placed from triage:

## 2022-09-13 NOTE — MAU Provider Note (Signed)
History     [id number]  Arrival date and time: 09/13/22 2028    Chief Complaint  Patient presents with   Abdominal Pain     HPI Bailey Bailey is a 31 y.o. at [redacted]w[redacted]d who presents for nausea, vomiting, constipation, and abdominal pain.  Has had nausea & vomiting throughout the pregnancy. Reports vomiting countless times today. Scopolamine was working for her but has skin reaction no matter where she places it so has discontinued it. Took phenergan & zofran today without relief.  Last BM was over a week ago. Was previously prescribed miralax but hasn't taken it yet due to vomiting.  Reports intermittent sharp abdominal pains since yesterday. Denies fever, dysuria, vaginal bleeding, LOF, or fever.   OB History     Gravida  6   Para  2   Term  2   Preterm  0   AB  3   Living  2      SAB  2   IAB  0   Ectopic  1   Multiple  0   Live Births  2           Past Medical History:  Diagnosis Date   Anemia    Anxiety    panic attacks   Asthma    inhaler PRN- rarely uses   History of postpartum depression    mild- no meds   Hx of chlamydia infection 07/2013   Hx of tooth extraction 2015    Past Surgical History:  Procedure Laterality Date   ARTHROTOMY Right 01/14/2020   Procedure: ARTHROTOMY;  Surgeon: Hiram Gash, MD;  Location: [place];  Service: Orthopedics;  Laterality: Right;   CESAREAN SECTION  04/16/2012   Procedure: CESAREAN SECTION;  Surgeon: Margarette Asal, MD;  Location: [facility];  Service: Gynecology;  Laterality: N/A;   CESAREAN SECTION N/A 08/03/2015   Procedure: CESAREAN SECTION;  Surgeon: Molli Posey, MD;  Location: [facility];  Service: Obstetrics;  Laterality: N/A;  Repeat edc 08/10/15 nkda   HARDWARE REMOVAL Right 01/14/2020   Procedure: HARDWARE REMOVAL;  Surgeon: Hiram Gash, MD;  Location: [place];  Service: Orthopedics;  Laterality: Right;   ORIF HUMERUS FRACTURE Right 04/24/2019    Procedure: OPEN REDUCTION INTERNAL FIXATION (ORIF) DISTAL HUMERUS FRACTURE;  Surgeon: Hiram Gash, MD;  Location: [place];  Service: Orthopedics;  Laterality: Right;   TONSILLECTOMY     ULNAR NERVE TRANSPOSITION Right 01/14/2020   Procedure: RIGHT ELBOW ARTHROTOMY WITH CAPSULAR EXCISION , REMOVAL RETAINED HARDWARE, NEUROPLASTY AND TRANSPOSITION,ULNAR NERVE AT ELBOW;  Surgeon: Hiram Gash, MD;  Location: [place];  Service: Orthopedics;  Laterality: Right;  BLOCK EXPAREL    Family History  Problem Relation Age of Onset   Kidney disease Mother    Migraines Mother    Diabetes Father    Breast cancer Father    Heart disease Maternal Grandmother 62       CHF    No Known Allergies  No current facility-administered medications on file prior to encounter.   Current Outpatient Medications on File Prior to Encounter  Medication Sig Dispense Refill   ondansetron (ZOFRAN) 8 MG tablet Take 1 tablet (8 mg total) by mouth every 8 (eight) hours as needed for nausea or vomiting. 30 tablet 3   Prenatal Vit-Fe Fumarate-FA (PRENATAL VITAMIN) 27-0.8 MG TABS Take 1 tablet by mouth daily. 30 tablet    promethazine (PHENERGAN) 12.5 MG tablet Take 2 tablets (  25 mg total) by mouth every 6 (six) hours as needed for nausea or vomiting. May be taken orally, vaginally or rectally 30 tablet 3   scopolamine (TRANSDERM-SCOP) 1 MG/3DAYS Place 1 patch (1.5 mg total) onto the skin every 3 (three) days. 10 patch 6   hydrOXYzine (VISTARIL) 50 MG capsule Take 1 capsule (50 mg total) by mouth 3 (three) times daily as needed. 30 capsule 0   polyethylene glycol powder (GLYCOLAX/MIRALAX) 17 GM/SCOOP powder Take 17 g by mouth daily as needed for moderate constipation. 500 g 2   terconazole (TERAZOL 7) 0.4 % vaginal cream Place 1 applicator vaginally at bedtime. 45 g 0     ROS Pertinent positives and negative per HPI, all others reviewed and negative  Physical Exam   BP 108/62 (BP Location: Right Arm)    Pulse (!) 105   Temp 98.4 F (36.9 C) (Oral)   Resp 18   Ht 5\' 5"  (1.651 m)   Wt 89.5 kg   LMP 10/01/2021   SpO2 97%   BMI 32.85 kg/m   Patient Vitals for the past 24 hrs:  BP Temp Temp src Pulse Resp SpO2 Height Weight  09/13/22 2153 108/62 98.4 F (36.9 C) Oral (!) 105 18 97 % 5\' 5"  (1.651 m) 89.5 kg    Physical Exam Vitals and nursing note reviewed. Exam conducted with a chaperone present.  Constitutional:      Appearance: She is well-developed. She is not ill-appearing or toxic-appearing.  HENT:     Head: Normocephalic and atraumatic.  Pulmonary:     Effort: Pulmonary effort is normal. No respiratory distress.  Abdominal:     General: Bowel sounds are decreased.     Palpations: Abdomen is soft.     Tenderness: There is no abdominal tenderness. There is no guarding.  Genitourinary:    Comments: Cervix closed/thick Skin:    General: Skin is warm and dry.  Neurological:     Mental Status: She is alert.     Labs No results found for this or any previous visit (from the past 24 hour(s)).  Imaging No results found.  MAU Course  Procedures Lab Orders         Urinalysis, Routine w reflex microscopic     Meds ordered this encounter  Medications   lactated ringers bolus 1,000 mL   famotidine (PEPCID) IVPB 20 mg premix   promethazine (PHENERGAN) 25 mg in sodium chloride 0.9 % 50 mL IVPB   sorbitol, milk of mag, mineral oil, glycerin (SMOG) enema   promethazine (PHENERGAN) 25 MG suppository    Sig: Place 1 suppository (25 mg total) rectally every 6 (six) hours as needed for nausea or vomiting.    Dispense:  12 each    Refill:  0    Order Specific Question:   Supervising Provider    Answer:   [id number]   Imaging Orders  No imaging studies ordered today    MDM FHT present via doppler & cervix closed Given IV fluids, pepcid, & phenergan. Patient not observed vomiting in MAU.   SMOG enema given for constipation. Patient tolerated only 100 cc of  fluid. Had a small BM. Discussed management of constipation at home. Abdominal pain likely related to constipation & vomiting.  Assessment and Plan   1. Hyperemesis gravidarum  -Rx phenergan suppository  2. Drug-induced constipation  -D/c zofran -Given information for constipation & meds to use at home - Reviewed reasons to return to MAU  3. [redacted]  weeks gestation of pregnancy      Judeth Horn, NP 09/14/22 12:58 AM

## 2022-09-14 DIAGNOSIS — K5903 Drug induced constipation: Secondary | ICD-10-CM

## 2022-09-14 DIAGNOSIS — Z3A18 18 weeks gestation of pregnancy: Secondary | ICD-10-CM

## 2022-09-14 DIAGNOSIS — O210 Mild hyperemesis gravidarum: Secondary | ICD-10-CM

## 2022-09-14 MED ORDER — ACETAMINOPHEN 325 MG PO TABS
650.0000 mg | ORAL_TABLET | Freq: Once | ORAL | Status: AC
  Administered 2022-09-14: 650 mg via ORAL
  Filled 2022-09-14: qty 2

## 2022-09-14 MED ORDER — PROMETHAZINE HCL 25 MG RE SUPP: 25.0000 mg | Freq: Four times a day (QID) | RECTAL | 0 refills | Status: DC | PRN

## 2022-09-14 NOTE — Discharge Instructions (Signed)
You have constipation which is hard stools that are difficult to pass. It is important to have regular bowel movements every 1-3 days that are soft and easy to pass. Hard stools increase your risk of hemorrhoids and are very uncomfortable.   To prevent constipation you can increase the amount of fiber in your diet. Examples of foods with fiber are leafy greens, whole grain breads, oatmeal and other grains.  It is also important to drink at least 64 ounces of water everyday.   If you have not had a bowel movement in 4-5 days, you made need to clean out your bowel.  This will help you establish normal movements through your bowel.    Miralax Clean out Take 8 capfuls of miralax in 64 oz of gatorade. You can use any fluid that appeals to you (gatorade, water, juice) Continue to drink at least 64 ounces of water throughout the day You can repeat with another 8 capfuls of miralax in 64 oz of gatorade if you are not having a large amount of stools You will need to be at home and close to a bathroom for about 8 hours when you do the above as you may need to go to the bathroom frequently.   After you are cleaned out: - Start Colace100mg twice daily - Start Miralax once daily - Start a daily fiber supplement like metamucil or citrucel - You can safely use enemas in pregnancy  - if you are having diarrhea you can reduce to Colace once a day or miralax every other day or a 1/2 capful daily.   

## 2022-11-13 ENCOUNTER — Inpatient Hospital Stay (HOSPITAL_COMMUNITY): Payer: Medicaid Other

## 2022-11-13 ENCOUNTER — Inpatient Hospital Stay (HOSPITAL_COMMUNITY)
Admission: AD | Admit: 2022-11-13 | Discharge: 2022-11-13 | Disposition: A | Discharge status: Home / Self Care | Payer: Medicaid Other | Attending: Obstetrics and Gynecology | Admitting: Obstetrics and Gynecology

## 2022-11-13 ENCOUNTER — Encounter (HOSPITAL_COMMUNITY): Payer: Self-pay | Admitting: Obstetrics and Gynecology

## 2022-11-13 DIAGNOSIS — Z3689 Encounter for other specified antenatal screening: Secondary | ICD-10-CM

## 2022-11-13 DIAGNOSIS — Z79899 Other long term (current) drug therapy: Secondary | ICD-10-CM | POA: Diagnosis not present

## 2022-11-13 DIAGNOSIS — Z3A27 27 weeks gestation of pregnancy: Secondary | ICD-10-CM | POA: Insufficient documentation

## 2022-11-13 DIAGNOSIS — O26892 Other specified pregnancy related conditions, second trimester: Secondary | ICD-10-CM | POA: Insufficient documentation

## 2022-11-13 DIAGNOSIS — R0789 Other chest pain: Secondary | ICD-10-CM | POA: Insufficient documentation

## 2022-11-13 DIAGNOSIS — R079 Chest pain, unspecified: Secondary | ICD-10-CM | POA: Diagnosis present

## 2022-11-13 LAB — CBC WITH DIFFERENTIAL/PLATELET
Abs Immature Granulocytes: 0.07 10*3/uL (ref 0.00–0.07)
Basophils Absolute: 0 10*3/uL (ref 0.0–0.1)
Basophils Relative: 0 %
Eosinophils Absolute: 0.1 10*3/uL (ref 0.0–0.5)
Eosinophils Relative: 1 %
HCT: 33.1 % — ABNORMAL LOW (ref 36.0–46.0)
Hemoglobin: 10.6 g/dL — ABNORMAL LOW (ref 12.0–15.0)
Immature Granulocytes: 1 %
Lymphocytes Relative: 14 %
Lymphs Abs: 1.8 10*3/uL (ref 0.7–4.0)
MCH: 29.4 pg (ref 26.0–34.0)
MCHC: 32 g/dL (ref 30.0–36.0)
MCV: 91.7 fL (ref 80.0–100.0)
Monocytes Absolute: 0.7 10*3/uL (ref 0.1–1.0)
Monocytes Relative: 6 %
Neutro Abs: 10.1 10*3/uL — ABNORMAL HIGH (ref 1.7–7.7)
Neutrophils Relative %: 78 %
Platelets: 215 10*3/uL (ref 150–400)
RBC: 3.61 MIL/uL — ABNORMAL LOW (ref 3.87–5.11)
RDW: 13.3 % (ref 11.5–15.5)
WBC: 12.9 10*3/uL — ABNORMAL HIGH (ref 4.0–10.5)
nRBC: 0 % (ref 0.0–0.2)

## 2022-11-13 LAB — COMPREHENSIVE METABOLIC PANEL
ALT: 22 U/L (ref 0–44)
AST: 20 U/L (ref 15–41)
Albumin: 2.8 g/dL — ABNORMAL LOW (ref 3.5–5.0)
Alkaline Phosphatase: 66 U/L (ref 38–126)
Anion gap: 12 (ref 5–15)
BUN: 5 mg/dL — ABNORMAL LOW (ref 6–20)
CO2: 21 mmol/L — ABNORMAL LOW (ref 22–32)
Calcium: 9 mg/dL (ref 8.9–10.3)
Chloride: 102 mmol/L (ref 98–111)
Creatinine, Ser: 0.46 mg/dL (ref 0.44–1.00)
GFR, Estimated: >60 mL/min (ref >60)
Glucose, Bld: 110 mg/dL — ABNORMAL HIGH (ref 70–99)
Potassium: 3.3 mmol/L — ABNORMAL LOW (ref 3.5–5.1)
Sodium: 135 mmol/L (ref 135–145)
Total Bilirubin: 0.5 mg/dL (ref 0.3–1.2)
Total Protein: 6.8 g/dL (ref 6.5–8.1)

## 2022-11-13 LAB — D-DIMER, QUANTITATIVE: D-Dimer, Quant: 1.3 ug/mL-FEU — ABNORMAL HIGH (ref 0.00–0.50)

## 2022-11-13 LAB — SEDIMENTATION RATE: Sed Rate: 54 mm/hr — ABNORMAL HIGH (ref 0–22)

## 2022-11-13 LAB — C-REACTIVE PROTEIN: CRP: 5.3 mg/dL — ABNORMAL HIGH (ref <1.0)

## 2022-11-13 MED ORDER — CYCLOBENZAPRINE HCL 10 MG PO TABS: 10.0000 mg | ORAL_TABLET | Freq: Three times a day (TID) | ORAL | 1 refills | Status: DC | PRN

## 2022-11-13 MED ORDER — OXYCODONE HCL 5 MG PO TABS
5.0000 mg | ORAL_TABLET | Freq: Once | ORAL | Status: AC
  Administered 2022-11-13: 5 mg via ORAL
  Filled 2022-11-13: qty 1

## 2022-11-13 MED ORDER — OXYCODONE-ACETAMINOPHEN 5-325 MG PO TABS: 1.0000 | ORAL_TABLET | ORAL | 0 refills | Status: AC | PRN

## 2022-11-13 MED ORDER — CYCLOBENZAPRINE HCL 5 MG PO TABS
10.0000 mg | ORAL_TABLET | Freq: Once | ORAL | Status: AC
  Administered 2022-11-13: 10 mg via ORAL
  Filled 2022-11-13: qty 2

## 2022-11-13 NOTE — MAU Note (Signed)
Pt says her chest hurts to breathe - started yesterday .  Had appointment today with Dr Vincente Poli. Worse today  No UC's  No cough , when she lays on left side - chest hurts ,  O2 sat - 99 %

## 2022-11-13 NOTE — MAU Provider Note (Signed)
Chief Complaint:  Chest Pain  First provider contact at [place].   HPI: Bailey Bailey is a 31 y.o. [id number] at 20w0dwho presents to maternity admissions reporting left sided chest pain that began yesterday. It is severe, sharp, aggravated by deep breaths and movement. Says she woke up with this pain. Has tried Tylenol with no relief, thinks she took her last dose about 5-6hrs ago. Was seen by her OB today who said it was a muscle strain. Denies vaginal bleeding, leaking of fluid, decreased fetal movement, fever, falls, or recent illness.   [place] [facility] [place] Has had multiple MAU visits prior (12) for nausea/vomiting and abdominal pain. No cardiac history.  Past Medical History:  Diagnosis Date   Anemia    Anxiety    panic attacks   Asthma    inhaler PRN- rarely uses   History of postpartum depression    mild- no meds   Hx of chlamydia infection 07/2013   Hx of tooth extraction 2015   OB History  Gravida Para Term Preterm AB Living  _0 0 3 2  SAB IAB Ectopic Multiple Live Births  2 0 1 0 2    # Outcome Date GA Lbr Len/2nd Weight Sex Delivery Anes PTL Lv  6 Current           5 SAB 12/04/21 [redacted]w[redacted]d       4 Ectopic 05/2021          3 Term 08/03/15 3953w0d lb 1.7 oz (2.77 kg) F CS-LTranv Spinal  LIV  2 Term 04/16/12 41w3w0dlb 10.2 oz (3.465 kg) F CS-LTranv EPI  LIV  1 SAB            Past Surgical History:  Procedure Laterality Date   ARTHROTOMY Right 01/14/2020   Procedure: ARTHROTOMY;  Surgeon: VarkHiram Gash;  [place] [facility] [facility] [place];  Laterality: Right;   CESAREAN SECTION  04/16/2012   Procedure: CESAREAN SECTION;  Surgeon: RichMargarette Asal;  [facility];  Service: Gynecology;  Laterality: N/A;   CESAREAN SECTION N/A 08/03/2015   Procedure: CESAREAN SECTION;  Surgeon: RichMolli Posey;  [facility];  Service: Obstetrics;  Laterality: N/A;  Repeat edc 08/10/15 nkda    HARDWARE REMOVAL Right 01/14/2020   Procedure: HARDWARE REMOVAL;  Surgeon: VarkHiram Gash;  [place] [facility];  Laterality: Right;   ORIF HUMERUS FRACTURE Right 04/24/2019   Procedure: OPEN REDUCTION INTERNAL FIXATION (ORIF) DISTAL HUMERUS FRACTURE;  Surgeon: VarkHiram Gash;  [facility] [facility] [place];  Laterality: Right;   TONSILLECTOMY     ULNAR NERVE TRANSPOSITION Right 01/14/2020   Procedure: RIGHT ELBOW ARTHROTOMY WITH CAPSULAR EXCISION , REMOVAL RETAINED HARDWARE, NEUROPLASTY AND TRANSPOSITION,ULNAR NERVE AT ELBOW;  Surgeon: VarkHiram Gash;  [place] [place];  Laterality: Right;  BLOCK EXPAREL   Family History  Problem Relation Age of Onset   Kidney disease Mother    Migraines Mother    Diabetes Father    Breast cancer Father    Heart disease Maternal Grandmother 62       CHF   Social History   Tobacco Use   Smoking status: Never   Smokeless tobacco: Never  Vaping Use   Vaping Use: Never used  Substance Use Topics   Alcohol use: No   Drug  use: No   No Known Allergies Medications Prior to Admission  Medication Sig Dispense Refill Last Dose   promethazine (PHENERGAN) 12.5 MG tablet Take 2 tablets (25 mg total) by mouth every 6 (six) hours as needed for nausea or vomiting. May be taken orally, vaginally or rectally 30 tablet 3 Past Month   hydrOXYzine (VISTARIL) 50 MG capsule Take 1 capsule (50 mg total) by mouth 3 (three) times daily as needed. 30 capsule 0    polyethylene glycol powder (GLYCOLAX/MIRALAX) 17 GM/SCOOP powder Take 17 g by mouth daily as needed for moderate constipation. 500 g 2    Prenatal Vit-Fe Fumarate-FA (PRENATAL VITAMIN) 27-0.8 MG TABS Take 1 tablet by mouth daily. 30 tablet     promethazine (PHENERGAN) 25 MG suppository Place 1 suppository (25 mg total) rectally every 6 (six) hours as needed for nausea or vomiting. 12 each 0    scopolamine  (TRANSDERM-SCOP) 1 MG/3DAYS Place 1 patch (1.5 mg total) onto the skin every 3 (three) days. 10 patch 6    terconazole (TERAZOL 7) 0.4 % vaginal cream Place 1 applicator vaginally at bedtime. 45 g 0    I have reviewed patient's Past Medical Hx, Surgical Hx, Family Hx, Social Hx, medications and allergies.   ROS:  Pertinent items noted in HPI and remainder of comprehensive ROS otherwise negative.   Physical Exam  Patient Vitals for the past 24 hrs:  BP Temp Temp src Pulse Resp Height Weight  11/13/22 0026 104/62 98.4 F (36.9 C) Oral 97 18 5' 5" (1.651 m) 190 lb (86.2 kg)   Constitutional: Well-developed, well-nourished female in acute distress (crying and unable to recline - can sit upright or lay flat on her left side) Cardiovascular: normal rate & rhythm, warm and well-perfused Respiratory: normal effort, but shallow breathing from pain GI: Abd soft, non-tender, gravid appropriate for gestational age MS: Extremities nontender, no edema, normal ROM Neurologic: Alert and oriented x 4.  GU: no CVA tenderness Pelvic: exam deferred  Physical Exam Chest:       Comments: Points of tenderness to palpation, especially tender under left breast when pressed.   Fetal Tracing: reactive Baseline: 140 Variability: moderate Accelerations: 10x10 (appropriate for gestational age) Decelerations: none Toco: relaxed   Labs: Results for orders placed or performed during the hospital encounter of 11/13/22 (from the past 24 hour(s))  CBC with Differential/Platelet     Status: Abnormal   Collection Time: 11/13/22  1:47 AM  Result Value Ref Range   WBC 12.9 (H) 4.0 - 10.5 K/uL   RBC 3.61 (L) 3.87 - 5.11 MIL/uL   Hemoglobin 10.6 (L) 12.0 - 15.0 g/dL   HCT 33.1 (L) 36.0 - 46.0 %   MCV 91.7 80.0 - 100.0 fL   MCH 29.4 26.0 - 34.0 pg   MCHC 32.0 30.0 - 36.0 g/dL   RDW 13.3 11.5 - 15.5 %   Platelets 215 150 - 400 K/uL   nRBC 0.0 0.0 - 0.2 %   Neutrophils Relative % 78 %   Neutro Abs 10.1 (H)  1.7 - 7.7 K/uL   Lymphocytes Relative 14 %   Lymphs Abs 1.8 0.7 - 4.0 K/uL   Monocytes Relative 6 %   Monocytes Absolute 0.7 0.1 - 1.0 K/uL   Eosinophils Relative 1 %   Eosinophils Absolute 0.1 0.0 - 0.5 K/uL   Basophils Relative 0 %   Basophils Absolute 0.0 0.0 - 0.1 K/uL   Immature Granulocytes 1 %   Abs Immature Granulocytes 0.07 0.00 -  0.07 K/uL  Comprehensive metabolic panel     Status: Abnormal   Collection Time: 11/13/22  1:47 AM  Result Value Ref Range   Sodium 135 135 - 145 mmol/L   Potassium 3.3 (L) 3.5 - 5.1 mmol/L   Chloride 102 98 - 111 mmol/L   CO2 21 (L) 22 - 32 mmol/L   Glucose, Bld 110 (H) 70 - 99 mg/dL   BUN 5 (L) 6 - 20 mg/dL   Creatinine, Ser 0.46 0.44 - 1.00 mg/dL   Calcium 9.0 8.9 - 10.3 mg/dL   Total Protein 6.8 6.5 - 8.1 g/dL   Albumin 2.8 (L) 3.5 - 5.0 g/dL   AST 20 15 - 41 U/L   ALT 22 0 - 44 U/L   Alkaline Phosphatase 66 38 - 126 U/L   Total Bilirubin 0.5 0.3 - 1.2 mg/dL   GFR, Estimated >60 >60 mL/min   Anion gap 12 5 - 15  D-dimer, quantitative     Status: Abnormal   Collection Time: 11/13/22  1:47 AM  Result Value Ref Range   D-Dimer, Quant 1.30 (H) 0.00 - 0.50 ug/mL-FEU  C-reactive protein     Status: Abnormal   Collection Time: 11/13/22  4:09 AM  Result Value Ref Range   CRP 5.3 (H) <1.0 mg/dL  Sedimentation rate     Status: Abnormal   Collection Time: 11/13/22  4:09 AM  Result Value Ref Range   Sed Rate 54 (H) 0 - 22 mm/hr   Imaging:  DG CHEST PORT 1 VIEW  Result Date: 11/13/2022 CLINICAL DATA:  Chest pain EXAM: PORTABLE CHEST 1 VIEW COMPARISON:  None Available. FINDINGS: Heart and mediastinal contours are within normal limits. No focal opacities or effusions. No acute bony abnormality. Low lung volumes. IMPRESSION: Low lung volumes.  No active cardiopulmonary disease. Electronically Signed   By: Rolm Baptise M.D.   On: 11/13/2022 01:08    ECG: Sinus rhythm with 1st degree AV block  MAU Course: Orders Placed This Encounter   Procedures   DG CHEST PORT 1 VIEW   CBC with Differential/Platelet   Comprehensive metabolic panel   D-dimer, quantitative   C-reactive protein   Sedimentation rate   AMB Referral to Cardio Obstetrics   Apply heat to affected area   EKG 12-Lead   Discharge patient   Meds ordered this encounter  Medications   cyclobenzaprine (FLEXERIL) tablet 10 mg   oxyCODONE (Oxy IR/ROXICODONE) immediate release tablet 5 mg   cyclobenzaprine (FLEXERIL) 10 MG tablet    Sig: Take 1 tablet (10 mg total) by mouth every 8 (eight) hours as needed for muscle spasms.    Dispense:  30 tablet    Refill:  1    Order Specific Question:   Supervising Provider    Answer:   Donnamae Jude [[id number]]   oxyCODONE-acetaminophen (PERCOCET/ROXICET) 5-325 MG tablet    Sig: Take 1 tablet by mouth every 4 (four) hours as needed for up to 3 days for severe pain.    Dispense:  10 tablet    Refill:  0    Order Specific Question:   Supervising Provider    Answer:   Merrily Pew   MDM: Patient upset and in pain, unable to sit for ECG initially. Medicated with flexeril and oxycodone which initially helped relieve pain to 4-5/10. CXR and ECG able to be obtained. CBC with diff and CMP ordered.  CBC normal aside from mild anemia and elevated WBC/abs neutros. CMP normal. D-dimer  mildly elevated but appropriate for gestational status. CXR normal, ECG reviewed with Cardio Fellow, Dr. Alfred Levins who did not see cardiomegaly on her chest x-ray and pronounced her ECG normal. Suggested getting a CRP and ESR since she is having the pain + elevated WBC/abs neutros to r/o pericarditis, but advises if those are normal this is likely musculoskeletal in nature.  Additional labs ordered.  Labs reviewed with Dr. Alfred Levins who did not think they met criteria for pericarditis. Pain much relieved by medications, patient sleeping soundly after bloodwork. Stable for discharge home - will put in OB cardio consult out of an abundance of caution. Pt  encouraged to treat the pain like a muscle strain with alternating heat/cold, pain meds and rest of the area.  Assessment: 1. Musculoskeletal chest pain   2. NST (non-stress test) reactive   3. [redacted] weeks gestation of pregnancy    Plan: Discharge home in stable condition with return precautions     Follow-up Information     Ob/Gyn, [place] Follow up.   Specialty: Obstetrics and Gynecology Why: as scheduled for ongoing prenatal care Contact information: [address] [place] [address] [phone]                 Allergies as of 11/13/2022   No Known Allergies      Medication List     TAKE these medications    cyclobenzaprine 10 MG tablet Commonly known as: FLEXERIL Take 1 tablet (10 mg total) by mouth every 8 (eight) hours as needed for muscle spasms.   hydrOXYzine 50 MG capsule Commonly known as: Vistaril Take 1 capsule (50 mg total) by mouth 3 (three) times daily as needed.   oxyCODONE-acetaminophen 5-325 MG tablet Commonly known as: PERCOCET/ROXICET Take 1 tablet by mouth every 4 (four) hours as needed for up to 3 days for severe pain.   polyethylene glycol powder 17 GM/SCOOP powder Commonly known as: GLYCOLAX/MIRALAX Take 17 g by mouth daily as needed for moderate constipation.   Prenatal Vitamin 27-0.8 MG Tabs Take 1 tablet by mouth daily.   promethazine 12.5 MG tablet Commonly known as: PHENERGAN Take 2 tablets (25 mg total) by mouth every 6 (six) hours as needed for nausea or vomiting. May be taken orally, vaginally or rectally   promethazine 25 MG suppository Commonly known as: PHENERGAN Place 1 suppository (25 mg total) rectally every 6 (six) hours as needed for nausea or vomiting.   scopolamine 1 MG/3DAYS Commonly known as: TRANSDERM-SCOP Place 1 patch (1.5 mg total) onto the skin every 3 (three) days.   terconazole 0.4 % vaginal cream Commonly known as: TERAZOL 7 Place 1 applicator vaginally at bedtime.        Gaylan Gerold, CNM, MSN, [place] Certified Nurse Midwife, [facility]

## 2022-11-30 ENCOUNTER — Encounter: Payer: Self-pay | Admitting: *Deleted

## 2022-12-10 ENCOUNTER — Other Ambulatory Visit: Payer: Self-pay

## 2022-12-10 ENCOUNTER — Encounter (HOSPITAL_COMMUNITY): Payer: Self-pay | Admitting: Obstetrics and Gynecology

## 2022-12-10 ENCOUNTER — Inpatient Hospital Stay (HOSPITAL_COMMUNITY)
Admission: AD | Admit: 2022-12-10 | Discharge: 2022-12-10 | Disposition: A | Discharge status: Home / Self Care | Payer: Medicaid Other | Attending: Obstetrics and Gynecology | Admitting: Obstetrics and Gynecology

## 2022-12-10 DIAGNOSIS — J101 Influenza due to other identified influenza virus with other respiratory manifestations: Secondary | ICD-10-CM | POA: Diagnosis not present

## 2022-12-10 DIAGNOSIS — O98513 Other viral diseases complicating pregnancy, third trimester: Secondary | ICD-10-CM | POA: Diagnosis not present

## 2022-12-10 DIAGNOSIS — R110 Nausea: Secondary | ICD-10-CM | POA: Diagnosis not present

## 2022-12-10 DIAGNOSIS — Z3A30 30 weeks gestation of pregnancy: Secondary | ICD-10-CM | POA: Diagnosis not present

## 2022-12-10 DIAGNOSIS — O26893 Other specified pregnancy related conditions, third trimester: Secondary | ICD-10-CM | POA: Insufficient documentation

## 2022-12-10 DIAGNOSIS — Z1152 Encounter for screening for COVID-19: Secondary | ICD-10-CM | POA: Insufficient documentation

## 2022-12-10 DIAGNOSIS — B349 Viral infection, unspecified: Secondary | ICD-10-CM | POA: Insufficient documentation

## 2022-12-10 LAB — URINALYSIS, ROUTINE W REFLEX MICROSCOPIC
Bilirubin Urine: NEGATIVE
Glucose, UA: NEGATIVE mg/dL
Hgb urine dipstick: NEGATIVE
Ketones, ur: >80 mg/dL — AB
Nitrite: NEGATIVE
Protein, ur: NEGATIVE mg/dL
Specific Gravity, Urine: 1.02 (ref 1.005–1.030)
pH: 7 (ref 5.0–8.0)

## 2022-12-10 LAB — URINALYSIS, MICROSCOPIC (REFLEX)

## 2022-12-10 LAB — RESP PANEL BY RT-PCR (RSV, FLU A&B, COVID)  RVPGX2
Influenza A by PCR: POSITIVE — AB
Influenza B by PCR: NEGATIVE
Resp Syncytial Virus by PCR: NEGATIVE
SARS Coronavirus 2 by RT PCR: NEGATIVE

## 2022-12-10 MED ORDER — BENZONATATE 100 MG PO CAPS
200.0000 mg | ORAL_CAPSULE | Freq: Once | ORAL | Status: AC
  Administered 2022-12-10: 200 mg via ORAL
  Filled 2022-12-10: qty 2

## 2022-12-10 MED ORDER — LACTATED RINGERS IV BOLUS
1000.0000 mL | Freq: Once | INTRAVENOUS | Status: AC
  Administered 2022-12-10: 1000 mL via INTRAVENOUS

## 2022-12-10 MED ORDER — BENZONATATE 100 MG PO CAPS: 200.0000 mg | ORAL_CAPSULE | Freq: Three times a day (TID) | ORAL | 0 refills | Status: DC | PRN

## 2022-12-10 MED ORDER — ONDANSETRON HCL 4 MG/2ML IJ SOLN
4.0000 mg | Freq: Once | INTRAMUSCULAR | Status: AC
  Administered 2022-12-10: 4 mg via INTRAVENOUS
  Filled 2022-12-10: qty 2

## 2022-12-10 MED ORDER — OSELTAMIVIR PHOSPHATE 75 MG PO CAPS: 75.0000 mg | ORAL_CAPSULE | Freq: Two times a day (BID) | ORAL | 0 refills | Status: AC

## 2022-12-10 MED ORDER — ONDANSETRON 4 MG PO TBDP: 4.0000 mg | ORAL_TABLET | Freq: Three times a day (TID) | ORAL | 0 refills | Status: DC | PRN

## 2022-12-10 MED ORDER — ACETAMINOPHEN 500 MG PO TABS
1000.0000 mg | ORAL_TABLET | Freq: Once | ORAL | Status: AC
  Administered 2022-12-10: 1000 mg via ORAL
  Filled 2022-12-10: qty 2

## 2022-12-10 NOTE — MAU Note (Signed)
[name] is a 31 y.o. at [redacted]w[redacted]d here in MAU reporting: pelvic pain, body aches, and cough that began last night.  Reports cough is productive.  Reports took children's tylenol @ 1000 this morning. Denies VB or LOF.  Reports no FM since last night LMP: NA Onset of complaint: yesterday Pain score: 4 pelvic pain & 10 body aches Vitals:   12/10/22 1342  BP: (!) 89/48  Pulse: (!) 110  Resp: 18  Temp: 99.8 F (37.7 C)  SpO2: 100%     FHT:165 bpm Lab orders placed from triage:   UA

## 2022-12-10 NOTE — MAU Provider Note (Signed)
History     [id number]  Arrival date and time: 12/10/22 1317    Chief Complaint  Patient presents with   Body Aches   Pelvic Pain   Cough     HPI Bailey Bailey is a 31 y.o. at [redacted]w[redacted]d who presents for back pain, body aches, & cough. Denies sick contacts but states she saw her father after he saw his father who was sick with the flu.  Current symptoms started last night & include nausea, low back pain, body aches, & non productive cough. Hasn't checked temp at home but thought she had a fever. Denies sore throat, vomiting, diarrhea, abdominal pain, dysuria, vaginal bleeding, or LOF. No SOB. Chest pain with cough. No fetal movement since last night.   OB History     Gravida  6   Para  2   Term  2   Preterm  0   AB  3   Living  2      SAB  2   IAB  0   Ectopic  1   Multiple  0   Live Births  2           Past Medical History:  Diagnosis Date   Anemia    Anxiety    panic attacks   Asthma    inhaler PRN- rarely uses   History of postpartum depression    mild- no meds   Hx of chlamydia infection 07/2013   Hx of tooth extraction 2015    Past Surgical History:  Procedure Laterality Date   ARTHROTOMY Right 01/14/2020   Procedure: ARTHROTOMY;  Surgeon: Bjorn Pippin, MD;  Location: [facility];  Service: Orthopedics;  Laterality: Right;   CESAREAN SECTION  04/16/2012   Procedure: CESAREAN SECTION;  Surgeon: Meriel Pica, MD;  [facility];  Service: Gynecology;  Laterality: N/A;   CESAREAN SECTION N/A 08/03/2015   Procedure: CESAREAN SECTION;  Surgeon: Richarda Overlie, MD;  [facility];  Service: Obstetrics;  Laterality: N/A;  Repeat edc 08/10/15 nkda   HARDWARE REMOVAL Right 01/14/2020   Procedure: HARDWARE REMOVAL;  Surgeon: Bjorn Pippin, MD;  Location: [facility];  Service: Orthopedics;  Laterality: Right;   ORIF HUMERUS FRACTURE Right 04/24/2019   Procedure: OPEN REDUCTION INTERNAL FIXATION (ORIF)  DISTAL HUMERUS FRACTURE;  Surgeon: Bjorn Pippin, MD;  [facility];  Service: Orthopedics;  Laterality: Right;   TONSILLECTOMY     ULNAR NERVE TRANSPOSITION Right 01/14/2020   Procedure: RIGHT ELBOW ARTHROTOMY WITH CAPSULAR EXCISION , REMOVAL RETAINED HARDWARE, NEUROPLASTY AND TRANSPOSITION,ULNAR NERVE AT ELBOW;  Surgeon: Bjorn Pippin, MD;  Location:  SURGERY CENTER;  Service: Orthopedics;  Laterality: Right;  BLOCK EXPAREL    Family History  Problem Relation Age of Onset   Kidney disease Mother    Migraines Mother    Diabetes Father    Breast cancer Father    Heart disease Maternal Grandmother 36       CHF    No Known Allergies  No current facility-administered medications on file prior to encounter.   Current Outpatient Medications on File Prior to Encounter  Medication Sig Dispense Refill   cyclobenzaprine (FLEXERIL) 10 MG tablet Take 1 tablet (10 mg total) by mouth every 8 (eight) hours as needed for muscle spasms. 30 tablet 1   Prenatal Vit-Fe Fumarate-FA (PRENATAL VITAMIN) 27-0.8 MG TABS Take 1 tablet by mouth daily. 30 tablet    progesterone (PROMETRIUM) 200 MG capsule  Take 200 mg by mouth at bedtime.     hydrOXYzine (VISTARIL) 50 MG capsule Take 1 capsule (50 mg total) by mouth 3 (three) times daily as needed. 30 capsule 0   polyethylene glycol powder (GLYCOLAX/MIRALAX) 17 GM/SCOOP powder Take 17 g by mouth daily as needed for moderate constipation. 500 g 2   promethazine (PHENERGAN) 12.5 MG tablet Take 2 tablets (25 mg total) by mouth every 6 (six) hours as needed for nausea or vomiting. May be taken orally, vaginally or rectally 30 tablet 3   promethazine (PHENERGAN) 25 MG suppository Place 1 suppository (25 mg total) rectally every 6 (six) hours as needed for nausea or vomiting. 12 each 0   scopolamine (TRANSDERM-SCOP) 1 MG/3DAYS Place 1 patch (1.5 mg total) onto the skin every 3 (three) days. 10 patch 6     ROS Pertinent positives and negative per HPI, all  others reviewed and negative  Physical Exam   BP 95/61   Pulse (!) 115   Temp 99.8 F (37.7 C) (Oral)   Resp 17   Ht 5\' 6"  (1.676 m)   Wt 92.8 kg   LMP 10/01/2021   SpO2 97%   BMI 33.02 kg/m   Patient Vitals for the past 24 hrs:  BP Temp Temp src Pulse Resp SpO2 Height Weight  12/10/22 1641 95/61 -- -- (!) 115 17 -- -- --  12/10/22 1620 -- -- -- -- -- 97 % -- --  12/10/22 1615 -- -- -- -- -- 96 % -- --  12/10/22 1610 -- -- -- -- -- 98 % -- --  12/10/22 1605 -- -- -- -- -- 96 % -- --  12/10/22 1600 -- -- -- -- -- 98 % -- --  12/10/22 1555 -- -- -- -- -- 97 % -- --  12/10/22 1550 -- -- -- -- -- 99 % -- --  12/10/22 1545 -- -- -- -- -- 99 % -- --  12/10/22 1540 -- -- -- -- -- 99 % -- --  12/10/22 1535 -- -- -- -- -- 99 % -- --  12/10/22 1533 (!) 114/50 -- -- (!) 115 -- -- -- --  12/10/22 1525 -- -- -- -- -- 99 % -- --  12/10/22 1520 -- -- -- -- -- 98 % -- --  12/10/22 1515 -- -- -- -- -- 99 % -- --  12/10/22 1510 -- -- -- -- -- 99 % -- --  12/10/22 1505 -- -- -- -- -- 100 % -- --  12/10/22 1500 -- -- -- -- -- 97 % -- --  12/10/22 1455 -- -- -- -- -- 96 % -- --  12/10/22 1440 -- -- -- -- -- 99 % -- --  12/10/22 1415 -- -- -- -- -- 98 % -- --  12/10/22 1410 -- -- -- -- -- 99 % -- --  12/10/22 1405 -- -- -- -- -- 97 % -- --  12/10/22 1401 (!) 105/41 -- -- (!) 123 -- -- -- --  12/10/22 1342 (!) 89/48 99.8 F (37.7 C) Oral (!) 110 18 100 % -- --  12/10/22 1334 -- -- -- -- -- -- 5\' 6"  (1.676 m) 92.8 kg    Physical Exam Vitals and nursing note reviewed.  Constitutional:      General: She is not in acute distress.    Appearance: Normal appearance.  HENT:     Head: Normocephalic and atraumatic.  Eyes:     General: No scleral icterus.  Conjunctiva/sclera: Conjunctivae normal.  Cardiovascular:     Rate and Rhythm: Regular rhythm. Tachycardia present.     Heart sounds: Normal heart sounds. No murmur heard. Pulmonary:     Effort: Pulmonary effort is normal. No  respiratory distress.     Breath sounds: Normal breath sounds. No wheezing.  Skin:    General: Skin is warm and dry.  Neurological:     Mental Status: She is alert.  Psychiatric:        Mood and Affect: Mood normal.        Behavior: Behavior normal.       FHT Baseline 150, moderate variability, 15x15 accels, no decels Toco: none Cat: 1  Labs Results for orders placed or performed during the hospital encounter of 12/10/22 (from the past 24 hour(s))  Resp panel by RT-PCR (RSV, Flu A&B, Covid) Anterior Nasal Swab     Status: Abnormal   Collection Time: 12/10/22  2:53 PM   Specimen: Anterior Nasal Swab  Result Value Ref Range   SARS Coronavirus 2 by RT PCR NEGATIVE NEGATIVE   Influenza A by PCR POSITIVE (A) NEGATIVE   Influenza B by PCR NEGATIVE NEGATIVE   Resp Syncytial Virus by PCR NEGATIVE NEGATIVE  Urinalysis, Routine w reflex microscopic Urine, Clean Catch     Status: Abnormal   Collection Time: 12/10/22  3:27 PM  Result Value Ref Range   Color, Urine YELLOW YELLOW   APPearance CLEAR CLEAR   Specific Gravity, Urine 1.020 1.005 - 1.030   pH 7.0 5.0 - 8.0   Glucose, UA NEGATIVE NEGATIVE mg/dL   Hgb urine dipstick NEGATIVE NEGATIVE   Bilirubin Urine NEGATIVE NEGATIVE   Ketones, ur >80 (A) NEGATIVE mg/dL   Protein, ur NEGATIVE NEGATIVE mg/dL   Nitrite NEGATIVE NEGATIVE   Leukocytes,Ua SMALL (A) NEGATIVE  Urinalysis, Microscopic (reflex)     Status: Abnormal   Collection Time: 12/10/22  3:27 PM  Result Value Ref Range   RBC / HPF 0-5 0 - 5 RBC/hpf   WBC, UA 0-5 0 - 5 WBC/hpf   Bacteria, UA RARE (A) NONE SEEN   Squamous Epithelial / LPF 0-5 0 - 5   Mucus PRESENT     Imaging No results found.  MAU Course  Procedures Lab Orders         Resp panel by RT-PCR (RSV, Flu A&B, Covid) Anterior Nasal Swab         Urinalysis, Routine w reflex microscopic Urine, Clean Catch         Urinalysis, Microscopic (reflex)    Meds ordered this encounter  Medications   lactated  ringers bolus 1,000 mL   acetaminophen (TYLENOL) tablet 1,000 mg   benzonatate (TESSALON) capsule 200 mg   ondansetron (ZOFRAN) injection 4 mg   ondansetron (ZOFRAN-ODT) 4 MG disintegrating tablet    Sig: Take 1 tablet (4 mg total) by mouth every 8 (eight) hours as needed for nausea or vomiting.    Dispense:  15 tablet    Refill:  0    Order Specific Question:   Supervising Provider    Answer:   Verita Schneiders A [[id number]]   benzonatate (TESSALON PERLES) 100 MG capsule    Sig: Take 2 capsules (200 mg total) by mouth 3 (three) times daily as needed for cough.    Dispense:  30 capsule    Refill:  0    Order Specific Question:   Supervising Provider    Answer:   Verita Schneiders A [id number]  oseltamivir (TAMIFLU) 75 MG capsule    Sig: Take 1 capsule (75 mg total) by mouth 2 (two) times daily for 5 days.    Dispense:  10 capsule    Refill:  0    Order Specific Question:   Supervising Provider    Answer:   Verita Schneiders A [id number]   Imaging Orders  No imaging studies ordered today    MDM Maternal & fetal tachycardia. Given IV fluids, tylenol, zofran, & tessalon. Fetal baseline improved with reactive fetal tracing & return of good movement per patient. Patient also reports feeling much better after meds & no longer reports pain.   After being discharge her respiratory panel returned positive for flu. Patient notified via mychart & tamiflu sent to pharmacy.  Assessment and Plan   1. Viral illness   2. Nausea   3. [redacted] weeks gestation of pregnancy    -Rx zofran, tessalon, & tamiflu -Quarantine - work not given -Reviewed symptomatic tx & OTC meds safe in pregnancy   Jorje Guild, NP 12/10/22 5:22 PM

## 2022-12-10 NOTE — Discharge Instructions (Signed)

## 2023-01-13 ENCOUNTER — Inpatient Hospital Stay (HOSPITAL_COMMUNITY)
Admission: AD | Admit: 2023-01-13 | Discharge: 2023-01-13 | Disposition: A | Discharge status: Home / Self Care | Payer: Medicaid Other | Attending: Obstetrics and Gynecology | Admitting: Obstetrics and Gynecology

## 2023-01-13 ENCOUNTER — Other Ambulatory Visit: Payer: Self-pay

## 2023-01-13 ENCOUNTER — Encounter (HOSPITAL_COMMUNITY): Payer: Self-pay | Admitting: Obstetrics and Gynecology

## 2023-01-13 DIAGNOSIS — Z3A35 35 weeks gestation of pregnancy: Secondary | ICD-10-CM | POA: Diagnosis not present

## 2023-01-13 DIAGNOSIS — B3731 Acute candidiasis of vulva and vagina: Secondary | ICD-10-CM | POA: Insufficient documentation

## 2023-01-13 DIAGNOSIS — Z0371 Encounter for suspected problem with amniotic cavity and membrane ruled out: Secondary | ICD-10-CM

## 2023-01-13 DIAGNOSIS — O42913 Preterm premature rupture of membranes, unspecified as to length of time between rupture and onset of labor, third trimester: Secondary | ICD-10-CM | POA: Insufficient documentation

## 2023-01-13 DIAGNOSIS — O4703 False labor before 37 completed weeks of gestation, third trimester: Secondary | ICD-10-CM | POA: Diagnosis not present

## 2023-01-13 DIAGNOSIS — O479 False labor, unspecified: Secondary | ICD-10-CM

## 2023-01-13 LAB — WET PREP, GENITAL
Sperm: NONE SEEN
Trich, Wet Prep: NONE SEEN
WBC, Wet Prep HPF POC: >=10 — AB (ref <10)

## 2023-01-13 MED ORDER — FLUCONAZOLE 150 MG PO TABS: 150.0000 mg | ORAL_TABLET | Freq: Once | ORAL | 2 refills | Status: AC

## 2023-01-13 MED ORDER — TRIAMCINOLONE ACETONIDE 0.1 % EX CREA
TOPICAL_CREAM | Freq: Three times a day (TID) | CUTANEOUS | Status: DC | PRN
  Filled 2023-01-13: qty 15

## 2023-01-13 MED ORDER — TERCONAZOLE 0.4 % VA CREA: 1.0000 | TOPICAL_CREAM | Freq: Every day | VAGINAL | 1 refills | Status: DC

## 2023-01-13 MED ORDER — FLUCONAZOLE 150 MG PO TABS
150.0000 mg | ORAL_TABLET | Freq: Once | ORAL | Status: AC
  Administered 2023-01-13: 150 mg via ORAL
  Filled 2023-01-13: qty 1

## 2023-01-13 MED ORDER — TRIAMCINOLONE ACETONIDE 0.1 % EX CREA: TOPICAL_CREAM | Freq: Three times a day (TID) | CUTANEOUS | 0 refills | Status: DC | PRN

## 2023-01-13 NOTE — MAU Provider Note (Signed)
CC:  Chief Complaint  Patient presents with   Rupture of Membranes   Vaginal Discharge      Event Date/Time   First Provider Initiated Contact with Patient 01/13/23 1302       HPI: Bailey Bailey is a 32 y.o. year old [id number] female at [redacted]w[redacted]d weeks gestation who presents to MAU reporting leaking small amount fluid since last night, green discharge, vulvar swelling and irritation and contractions. Was recently Dx'd and Tx'd with Yeast infection and BV. Not sure what meds were prescribed but completed each course. One medication of a vaginal medication x 1 week, possibly Terazol. Swelling and edema have gotten much worse.   Bailey Bailey is a 32 y.o. at [redacted]w[redacted]d here in MAU reporting: pos SROM last night and green/yellow dc that started this morning.  Pt states fluid is coming out slowly but it has continued to come out. Denies reg ctx    Associated Sx:  Vaginal bleeding: denies Leaking of fluid: possible Fetal movement: Nml  Hx C/S x 2.   O: Patient Vitals for the past 24 hrs:  BP Temp Temp src Pulse Resp SpO2  01/13/23 1407 110/66 98 F (36.7 C) Oral 75 20 100 %    General: NAD Heart: Regular rate Lungs: Normal rate and effort Abd: Soft, NT, Gravid, S=D Pelvic: moderately edematous and erythematous vulva with excoriations, neg pooling or blood. Moderate about of thick, yellow-ish white, curdlike discharge and small amount of thin, white discharge. No odor. Cervix visually closed.  Dilation: Closed/thick Exam by:: [place], CNM  EFM: 135, Moderate variability, 15 x 15 accelerations, no decelerations Toco: Rare, mild  Orders Placed This Encounter  Procedures   Wet prep, genital   Fern Test   Discharge patient   Meds ordered this encounter  Medications   fluconazole (DIFLUCAN) tablet 150 mg   DISCONTD: triamcinolone cream (KENALOG) 0.1 % cream   triamcinolone cream (KENALOG) 0.1 %    Sig: Apply topically 3 (three) times daily as needed (Itching).    Dispense:   30 g    Refill:  0    Order Specific Question:   Supervising Provider    Answer:   Donnamae Jude [[id number]]   terconazole (TERAZOL 7) 0.4 % vaginal cream    Sig: Place 1 applicator vaginally at bedtime.    Dispense:  45 g    Refill:  1    Order Specific Question:   Supervising Provider    Answer:   Donnamae Jude [[id number]]   fluconazole (DIFLUCAN) 150 MG tablet    Sig: Take 1 tablet (150 mg total) by mouth once for 1 dose. Take dose on 01/16/23. Can take additional dose three days later if symptoms persist.    Dispense:  2 tablet    Refill:  2    Order Specific Question:   Supervising Provider    Answer:   Merrily Pew   MDM Vaginal discharge, swelling, erythema C/W severe vaginal yeast infection. No evidence or SROM or labor. Will Tx w/ Diflucan Q3 days x 2-3 doses, Terazol 7 and Triamcinolone to quickly relieve pruritus and swelling. GC/Chlamydia pending.   A: [redacted]w[redacted]d week IUP 1. Vaginal yeast infection   2. No leakage of amniotic fluid into vagina   3. Braxton Hicks contractions    P: Discharge home in stable condition. Preterm labor precautions and fetal kick counts. Follow-up as scheduled for prenatal visit or sooner as needed if symptoms worsen. Return to maternity admissions as  needed if symptoms worsen.  [name], [place], [place] 01/13/2023 12:07 PM  3

## 2023-01-13 NOTE — Discharge Instructions (Signed)
Monistat 7 if Terazol too expensive

## 2023-01-13 NOTE — MAU Note (Signed)
.  Bailey Bailey is a 32 y.o. at [redacted]w[redacted]d here in MAU reporting: pos SROM last night and green/yellow dc that started this morning.  Pt states fluid is coming out slowly but it has continued to come out. Denies reg ctx   Onset of complaint: 1/20  Vitals:   01/13/23 1205 01/13/23 1207  BP:  115/61  Pulse:  91  Resp:  17  Temp:  99.2 F (37.3 C)  SpO2: 100%      FHT:150 Lab orders placed from triage:

## 2023-01-14 LAB — GC/CHLAMYDIA PROBE AMP (~~LOC~~) NOT AT ARMC
Chlamydia: NEGATIVE
Comment: NEGATIVE
Comment: NORMAL
Neisseria Gonorrhea: NEGATIVE

## 2023-01-16 LAB — OB RESULTS CONSOLE GBS: GBS: POSITIVE

## 2023-01-22 NOTE — Patient Instructions (Signed)
Bailey Bailey  01/22/2023   Your procedure is scheduled on:  02/05/2023  Arrive at 0800 at Entrance C on [address] at [facility]  and [address]. You are invited to use the FREE valet parking or use the Visitor's parking deck.  Pick up the phone at the desk and dial [phone].  Call this number if you have problems the morning of surgery: [phone]  Remember:   Do not eat food:(After Midnight) Desps de medianoche.  Do not drink clear liquids: (After Midnight) Desps de medianoche.  Take these medicines the morning of surgery with A SIP OF WATER:  none   Do not wear jewelry, make-up or nail polish.  Do not wear lotions, powders, or perfumes. Do not wear deodorant.  Do not shave 48 hours prior to surgery.  Do not bring valuables to the hospital.  [facility] is not   responsible for any belongings or valuables brought to the hospital.  Contacts, dentures or bridgework may not be worn into surgery.  Leave suitcase in the car. After surgery it may be brought to your room.  For patients admitted to the hospital, checkout time is 11:00 AM the day of              discharge.      Please read over the following fact sheets that you were given:     Preparing for Surgery

## 2023-01-23 ENCOUNTER — Encounter (HOSPITAL_COMMUNITY): Payer: Self-pay

## 2023-01-31 NOTE — H&P (Addendum)
Bailey Bailey is a 32 y.o. female presenting for repeat c/s.  Previous c/s x 2.  Normal NIPS female.  GBS postivie OB History     Gravida  6   Para  2   Term  2   Preterm  0   AB  3   Living  2      SAB  2   IAB  0   Ectopic  1   Multiple  0   Live Births  2          Past Medical History:  Diagnosis Date   Anemia    Anxiety    panic attacks   Asthma    inhaler PRN- rarely uses   Depression    History of postpartum depression    mild- no meds   Hx of chlamydia infection 07/2013   Hx of tooth extraction 2015   PONV (postoperative nausea and vomiting)    Past Surgical History:  Procedure Laterality Date   ARTHROTOMY Right 01/14/2020   Procedure: ARTHROTOMY;  Surgeon: Hiram Gash, MD;  Location: [place];  Service: Orthopedics;  Laterality: Right;   CESAREAN SECTION  04/16/2012   Procedure: CESAREAN SECTION;  Surgeon: Margarette Asal, MD;  Location: [facility];  Service: Gynecology;  Laterality: N/A;   CESAREAN SECTION N/A 08/03/2015   Procedure: CESAREAN SECTION;  Surgeon: Molli Posey, MD;  Location: [facility];  Service: Obstetrics;  Laterality: N/A;  Repeat edc 08/10/15 nkda   HARDWARE REMOVAL Right 01/14/2020   Procedure: HARDWARE REMOVAL;  Surgeon: Hiram Gash, MD;  Location: [place];  Service: Orthopedics;  Laterality: Right;   ORIF HUMERUS FRACTURE Right 04/24/2019   Procedure: OPEN REDUCTION INTERNAL FIXATION (ORIF) DISTAL HUMERUS FRACTURE;  Surgeon: Hiram Gash, MD;  Location: [place];  Service: Orthopedics;  Laterality: Right;   TONSILLECTOMY     ULNAR NERVE TRANSPOSITION Right 01/14/2020   Procedure: RIGHT ELBOW ARTHROTOMY WITH CAPSULAR EXCISION , REMOVAL RETAINED HARDWARE, NEUROPLASTY AND TRANSPOSITION,ULNAR NERVE AT ELBOW;  Surgeon: Hiram Gash, MD;  Location: [place];  Service: Orthopedics;  Laterality: Right;  BLOCK EXPAREL   Family History: family history includes Breast cancer in  her father; Diabetes in her father; Heart disease (age of onset: 71) in her maternal grandmother; Kidney disease in her mother; Migraines in her mother. Social History:  reports that she has never smoked. She has never used smokeless tobacco. She reports that she does not drink alcohol and does not use drugs.     Maternal Diabetes: No Genetic Screening: Normal Maternal Ultrasounds/Referrals: Normal Fetal Ultrasounds or other Referrals:  None Maternal Substance Abuse:  No Significant Maternal Medications:  None Significant Maternal Lab Results:  Group B Strep positive Number of Prenatal Visits:greater than 3 verified prenatal visits Other Comments:  None  Review of Systems History   Last menstrual period 10/01/2021, unknown if currently breastfeeding. Exam Physical Exam  Vitals and nursing note reviewed. Exam conducted with a chaperone present.  Constitutional:      Appearance: Normal appearance.  HENT:     Head: Normocephalic.  Eyes:     Pupils: Pupils are equal, round, and reactive to light.  Cardiovascular:     Rate and Rhythm: Normal rate and regular rhythm.     Pulses: Normal pulses.  Abdominal:     General: Abdomen is Gravid, nontender Neurological:     Mental Status: She is alert. Prenatal labs: ABO, Rh: A/Positive/-- (07/17 0000) Antibody:  Negative (07/17 0000) Rubella: Immune (07/17 0000) RPR: Nonreactive (07/17 0000)  HBsAg: Negative (07/17 0000)  HIV: Non-reactive (07/17 0000)  GBS: Positive/-- (01/24 0000)   Assessment/Plan: IUP at 39 weeks Previous c/s desires repeat Risks and benefits of C/S were discussed.  All questions were answered and informed consent was obtained.  Plan to proceed with low segment transverse Cesarean Section. She is Full code.  Luz Lex 01/31/2023, 9:39 AM This patient has been seen and examined.   All of her questions were answered.  Labs and vital signs reviewed.  Informed consent has been obtained.  The History and Physical  is current.

## 2023-02-04 ENCOUNTER — Other Ambulatory Visit (HOSPITAL_COMMUNITY)
Admission: RE | Admit: 2023-02-04 | Discharge: 2023-02-04 | Disposition: A | Discharge status: Home / Self Care | Payer: Medicaid Other | Source: Ambulatory Visit | Attending: Obstetrics and Gynecology | Admitting: Obstetrics and Gynecology

## 2023-02-04 DIAGNOSIS — Z98891 History of uterine scar from previous surgery: Secondary | ICD-10-CM

## 2023-02-04 DIAGNOSIS — Z01812 Encounter for preprocedural laboratory examination: Secondary | ICD-10-CM | POA: Insufficient documentation

## 2023-02-04 HISTORY — DX: Other specified postprocedural states: R11.2

## 2023-02-04 HISTORY — DX: Other specified postprocedural states: Z98.890

## 2023-02-04 HISTORY — DX: Depression, unspecified: F32.A

## 2023-02-04 LAB — CBC
HCT: 33 % — ABNORMAL LOW (ref 36.0–46.0)
Hemoglobin: 10.9 g/dL — ABNORMAL LOW (ref 12.0–15.0)
MCH: 28.8 pg (ref 26.0–34.0)
MCHC: 33 g/dL (ref 30.0–36.0)
MCV: 87.1 fL (ref 80.0–100.0)
Platelets: 140 10*3/uL — ABNORMAL LOW (ref 150–400)
RBC: 3.79 MIL/uL — ABNORMAL LOW (ref 3.87–5.11)
RDW: 14.7 % (ref 11.5–15.5)
WBC: 12.6 10*3/uL — ABNORMAL HIGH (ref 4.0–10.5)
nRBC: 0 % (ref 0.0–0.2)

## 2023-02-04 LAB — TYPE AND SCREEN
ABO/RH(D): A POS
Antibody Screen: NEGATIVE

## 2023-02-05 ENCOUNTER — Inpatient Hospital Stay (HOSPITAL_COMMUNITY)
Admission: RE | Admit: 2023-02-05 | Discharge: 2023-02-08 | DRG: 788 | Disposition: A | Discharge status: Home / Self Care | Payer: Medicaid Other | Attending: Obstetrics and Gynecology | Admitting: Obstetrics and Gynecology

## 2023-02-05 ENCOUNTER — Other Ambulatory Visit: Payer: Self-pay

## 2023-02-05 ENCOUNTER — Inpatient Hospital Stay (HOSPITAL_COMMUNITY): Payer: Medicaid Other | Admitting: Anesthesiology

## 2023-02-05 ENCOUNTER — Encounter (HOSPITAL_COMMUNITY): Payer: Self-pay | Admitting: Obstetrics and Gynecology

## 2023-02-05 ENCOUNTER — Encounter (HOSPITAL_COMMUNITY)
Admission: RE | Disposition: A | Discharge status: Home / Self Care | Payer: Self-pay | Source: Home / Self Care | Attending: Obstetrics and Gynecology

## 2023-02-05 DIAGNOSIS — O99820 Streptococcus B carrier state complicating pregnancy: Secondary | ICD-10-CM | POA: Diagnosis not present

## 2023-02-05 DIAGNOSIS — F418 Other specified anxiety disorders: Secondary | ICD-10-CM | POA: Diagnosis not present

## 2023-02-05 DIAGNOSIS — O9952 Diseases of the respiratory system complicating childbirth: Secondary | ICD-10-CM

## 2023-02-05 DIAGNOSIS — O34219 Maternal care for unspecified type scar from previous cesarean delivery: Secondary | ICD-10-CM

## 2023-02-05 DIAGNOSIS — O99214 Obesity complicating childbirth: Secondary | ICD-10-CM | POA: Diagnosis present

## 2023-02-05 DIAGNOSIS — O99824 Streptococcus B carrier state complicating childbirth: Secondary | ICD-10-CM | POA: Diagnosis present

## 2023-02-05 DIAGNOSIS — Z20822 Contact with and (suspected) exposure to covid-19: Secondary | ICD-10-CM | POA: Diagnosis present

## 2023-02-05 DIAGNOSIS — Z3A39 39 weeks gestation of pregnancy: Secondary | ICD-10-CM

## 2023-02-05 DIAGNOSIS — J45909 Unspecified asthma, uncomplicated: Secondary | ICD-10-CM | POA: Diagnosis present

## 2023-02-05 DIAGNOSIS — Z01812 Encounter for preprocedural laboratory examination: Secondary | ICD-10-CM

## 2023-02-05 DIAGNOSIS — O34211 Maternal care for low transverse scar from previous cesarean delivery: Principal | ICD-10-CM | POA: Diagnosis present

## 2023-02-05 DIAGNOSIS — O99344 Other mental disorders complicating childbirth: Secondary | ICD-10-CM | POA: Diagnosis not present

## 2023-02-05 DIAGNOSIS — Z98891 History of uterine scar from previous surgery: Principal | ICD-10-CM

## 2023-02-05 LAB — RPR: RPR Ser Ql: NONREACTIVE

## 2023-02-05 SURGERY — Surgical Case
Anesthesia: Spinal

## 2023-02-05 MED ORDER — SOD CITRATE-CITRIC ACID 500-334 MG/5ML PO SOLN
30.0000 mL | Freq: Once | ORAL | Status: AC
  Administered 2023-02-05: 30 mL via ORAL

## 2023-02-05 MED ORDER — MEASLES, MUMPS & RUBELLA VAC IJ SOLR: 0.5000 mL | Freq: Once | INTRAMUSCULAR | Status: DC

## 2023-02-05 MED ORDER — CHLORHEXIDINE GLUCONATE 0.12 % MT SOLN
OROMUCOSAL | Status: AC
  Filled 2023-02-05: qty 15

## 2023-02-05 MED ORDER — ORAL CARE MOUTH RINSE: 15.0000 mL | Freq: Once | OROMUCOSAL | Status: AC

## 2023-02-05 MED ORDER — TETANUS-DIPHTH-ACELL PERTUSSIS 5-2.5-18.5 LF-MCG/0.5 IM SUSY: 0.5000 mL | PREFILLED_SYRINGE | Freq: Once | INTRAMUSCULAR | Status: DC

## 2023-02-05 MED ORDER — LACTATED RINGERS IV SOLN: INTRAVENOUS | Status: DC

## 2023-02-05 MED ORDER — SCOPOLAMINE 1 MG/3DAYS TD PT72
1.0000 | MEDICATED_PATCH | Freq: Once | TRANSDERMAL | Status: DC
  Administered 2023-02-05: 1.5 mg via TRANSDERMAL

## 2023-02-05 MED ORDER — ONDANSETRON HCL 4 MG/2ML IJ SOLN
INTRAMUSCULAR | Status: AC
  Filled 2023-02-05: qty 2

## 2023-02-05 MED ORDER — SCOPOLAMINE 1 MG/3DAYS TD PT72
MEDICATED_PATCH | TRANSDERMAL | Status: AC
  Filled 2023-02-05: qty 1

## 2023-02-05 MED ORDER — DEXAMETHASONE SODIUM PHOSPHATE 4 MG/ML IJ SOLN
INTRAMUSCULAR | Status: AC
  Filled 2023-02-05: qty 1

## 2023-02-05 MED ORDER — SODIUM CHLORIDE 0.9 % IR SOLN
Status: DC | PRN
  Administered 2023-02-05: 1

## 2023-02-05 MED ORDER — SODIUM CHLORIDE 0.9 % IV SOLN
2.0000 g | INTRAVENOUS | Status: AC
  Administered 2023-02-05: 2 g via INTRAVENOUS

## 2023-02-05 MED ORDER — SODIUM CHLORIDE 0.9% FLUSH: 3.0000 mL | INTRAVENOUS | Status: DC | PRN

## 2023-02-05 MED ORDER — MEPERIDINE HCL 25 MG/ML IJ SOLN: 6.2500 mg | INTRAMUSCULAR | Status: DC | PRN

## 2023-02-05 MED ORDER — SODIUM CHLORIDE 0.9 % IV SOLN
25.0000 mg | Freq: Once | INTRAVENOUS | Status: DC
  Filled 2023-02-05: qty 1

## 2023-02-05 MED ORDER — NALOXONE HCL 0.4 MG/ML IJ SOLN: 0.4000 mg | INTRAMUSCULAR | Status: DC | PRN

## 2023-02-05 MED ORDER — DIPHENHYDRAMINE HCL 25 MG PO CAPS: 25.0000 mg | ORAL_CAPSULE | ORAL | Status: DC | PRN

## 2023-02-05 MED ORDER — NALOXONE HCL 4 MG/10ML IJ SOLN: 1.0000 ug/kg/h | INTRAVENOUS | Status: DC | PRN

## 2023-02-05 MED ORDER — SCOPOLAMINE 1 MG/3DAYS TD PT72: 1.0000 | MEDICATED_PATCH | Freq: Once | TRANSDERMAL | Status: DC

## 2023-02-05 MED ORDER — PRENATAL MULTIVITAMIN CH
1.0000 | ORAL_TABLET | Freq: Every day | ORAL | Status: DC
  Administered 2023-02-06: 1 via ORAL
  Filled 2023-02-05: qty 1

## 2023-02-05 MED ORDER — PHENYLEPHRINE HCL-NACL 20-0.9 MG/250ML-% IV SOLN
INTRAVENOUS | Status: DC | PRN
  Administered 2023-02-05: 60 ug/min via INTRAVENOUS

## 2023-02-05 MED ORDER — FAMOTIDINE 20 MG PO TABS
20.0000 mg | ORAL_TABLET | Freq: Once | ORAL | Status: AC
  Administered 2023-02-05: 20 mg via ORAL

## 2023-02-05 MED ORDER — DIPHENHYDRAMINE HCL 50 MG/ML IJ SOLN
12.5000 mg | INTRAMUSCULAR | Status: DC | PRN
  Administered 2023-02-05 (×2): 12.5 mg via INTRAVENOUS
  Filled 2023-02-05 (×2): qty 1

## 2023-02-05 MED ORDER — CHLORHEXIDINE GLUCONATE 0.12 % MT SOLN
15.0000 mL | Freq: Once | OROMUCOSAL | Status: AC
  Administered 2023-02-05: 15 mL via OROMUCOSAL

## 2023-02-05 MED ORDER — MORPHINE SULFATE (PF) 0.5 MG/ML IJ SOLN
INTRAMUSCULAR | Status: AC
  Filled 2023-02-05: qty 10

## 2023-02-05 MED ORDER — ACETAMINOPHEN 10 MG/ML IV SOLN
1000.0000 mg | Freq: Once | INTRAVENOUS | Status: AC
  Administered 2023-02-05: 1000 mg via INTRAVENOUS
  Filled 2023-02-05: qty 100

## 2023-02-05 MED ORDER — ZOLPIDEM TARTRATE 5 MG PO TABS: 5.0000 mg | ORAL_TABLET | Freq: Every evening | ORAL | Status: DC | PRN

## 2023-02-05 MED ORDER — BUPIVACAINE IN DEXTROSE 0.75-8.25 % IT SOLN
INTRATHECAL | Status: DC | PRN
  Administered 2023-02-05: 1.6 mL via INTRATHECAL

## 2023-02-05 MED ORDER — SIMETHICONE 80 MG PO CHEW
80.0000 mg | CHEWABLE_TABLET | Freq: Three times a day (TID) | ORAL | Status: DC
  Administered 2023-02-05 – 2023-02-08 (×7): 80 mg via ORAL
  Filled 2023-02-05 (×7): qty 1

## 2023-02-05 MED ORDER — FENTANYL CITRATE (PF) 100 MCG/2ML IJ SOLN
INTRAMUSCULAR | Status: AC
  Filled 2023-02-05: qty 2

## 2023-02-05 MED ORDER — KETOROLAC TROMETHAMINE 30 MG/ML IJ SOLN: 30.0000 mg | Freq: Four times a day (QID) | INTRAMUSCULAR | Status: AC | PRN

## 2023-02-05 MED ORDER — FENTANYL CITRATE (PF) 100 MCG/2ML IJ SOLN
25.0000 ug | INTRAMUSCULAR | Status: DC | PRN
  Administered 2023-02-05: 50 ug via INTRAVENOUS
  Administered 2023-02-05: 25 ug via INTRAVENOUS

## 2023-02-05 MED ORDER — ONDANSETRON HCL 4 MG/2ML IJ SOLN
INTRAMUSCULAR | Status: DC | PRN
  Administered 2023-02-05: 4 mg via INTRAVENOUS

## 2023-02-05 MED ORDER — POVIDONE-IODINE 10 % EX SWAB
2.0000 | Freq: Once | CUTANEOUS | Status: AC
  Administered 2023-02-05: 2 via TOPICAL

## 2023-02-05 MED ORDER — DIPHENHYDRAMINE HCL 25 MG PO CAPS: 25.0000 mg | ORAL_CAPSULE | Freq: Four times a day (QID) | ORAL | Status: DC | PRN

## 2023-02-05 MED ORDER — ONDANSETRON HCL 4 MG/2ML IJ SOLN
4.0000 mg | Freq: Three times a day (TID) | INTRAMUSCULAR | Status: DC | PRN
  Administered 2023-02-08: 4 mg via INTRAVENOUS
  Filled 2023-02-05: qty 2

## 2023-02-05 MED ORDER — KETOROLAC TROMETHAMINE 30 MG/ML IJ SOLN
30.0000 mg | Freq: Four times a day (QID) | INTRAMUSCULAR | Status: AC | PRN
  Administered 2023-02-05 (×2): 30 mg via INTRAVENOUS
  Filled 2023-02-05: qty 1

## 2023-02-05 MED ORDER — DEXAMETHASONE SODIUM PHOSPHATE 10 MG/ML IJ SOLN
INTRAMUSCULAR | Status: DC | PRN
  Administered 2023-02-05: 4 mg via INTRAVENOUS

## 2023-02-05 MED ORDER — OXYTOCIN-SODIUM CHLORIDE 30-0.9 UT/500ML-% IV SOLN
INTRAVENOUS | Status: DC | PRN
  Administered 2023-02-05: 300 mL via INTRAVENOUS

## 2023-02-05 MED ORDER — SOD CITRATE-CITRIC ACID 500-334 MG/5ML PO SOLN
ORAL | Status: AC
  Filled 2023-02-05: qty 30

## 2023-02-05 MED ORDER — FENTANYL CITRATE (PF) 100 MCG/2ML IJ SOLN
INTRAMUSCULAR | Status: DC | PRN
  Administered 2023-02-05: 85 ug via INTRAVENOUS

## 2023-02-05 MED ORDER — OXYTOCIN-SODIUM CHLORIDE 30-0.9 UT/500ML-% IV SOLN: 2.5000 [IU]/h | INTRAVENOUS | Status: AC

## 2023-02-05 MED ORDER — MENTHOL 3 MG MT LOZG: 1.0000 | LOZENGE | OROMUCOSAL | Status: DC | PRN

## 2023-02-05 MED ORDER — IBUPROFEN 600 MG PO TABS
600.0000 mg | ORAL_TABLET | Freq: Four times a day (QID) | ORAL | Status: AC
  Administered 2023-02-06 – 2023-02-08 (×9): 600 mg via ORAL
  Filled 2023-02-05 (×10): qty 1

## 2023-02-05 MED ORDER — PROMETHAZINE HCL 25 MG/ML IJ SOLN: 6.2500 mg | INTRAMUSCULAR | Status: DC | PRN

## 2023-02-05 MED ORDER — COCONUT OIL OIL: 1.0000 | TOPICAL_OIL | Status: DC | PRN

## 2023-02-05 MED ORDER — SENNOSIDES-DOCUSATE SODIUM 8.6-50 MG PO TABS
2.0000 | ORAL_TABLET | ORAL | Status: DC
  Administered 2023-02-05 – 2023-02-07 (×3): 2 via ORAL
  Filled 2023-02-05 (×3): qty 2

## 2023-02-05 MED ORDER — PHENYLEPHRINE HCL-NACL 20-0.9 MG/250ML-% IV SOLN
INTRAVENOUS | Status: AC
  Filled 2023-02-05: qty 250

## 2023-02-05 MED ORDER — FAMOTIDINE 20 MG PO TABS
ORAL_TABLET | ORAL | Status: AC
  Filled 2023-02-05: qty 1

## 2023-02-05 MED ORDER — FENTANYL CITRATE (PF) 100 MCG/2ML IJ SOLN
INTRAMUSCULAR | Status: DC | PRN
  Administered 2023-02-05: 15 ug via INTRATHECAL

## 2023-02-05 MED ORDER — KETOROLAC TROMETHAMINE 30 MG/ML IJ SOLN
INTRAMUSCULAR | Status: AC
  Filled 2023-02-05: qty 1

## 2023-02-05 MED ORDER — ACETAMINOPHEN 500 MG PO TABS
1000.0000 mg | ORAL_TABLET | Freq: Four times a day (QID) | ORAL | Status: AC
  Administered 2023-02-05 – 2023-02-06 (×2): 1000 mg via ORAL
  Filled 2023-02-05 (×2): qty 2

## 2023-02-05 MED ORDER — ACETAMINOPHEN 325 MG PO TABS
650.0000 mg | ORAL_TABLET | ORAL | Status: DC | PRN
  Administered 2023-02-06 – 2023-02-07 (×2): 650 mg via ORAL
  Filled 2023-02-05 (×2): qty 2

## 2023-02-05 MED ORDER — SODIUM CHLORIDE 0.9 % IV SOLN
INTRAVENOUS | Status: AC
  Filled 2023-02-05: qty 2

## 2023-02-05 MED ORDER — OXYTOCIN-SODIUM CHLORIDE 30-0.9 UT/500ML-% IV SOLN
INTRAVENOUS | Status: AC
  Filled 2023-02-05: qty 500

## 2023-02-05 MED ORDER — MORPHINE SULFATE (PF) 0.5 MG/ML IJ SOLN
INTRAMUSCULAR | Status: DC | PRN
  Administered 2023-02-05: 150 ug via INTRATHECAL

## 2023-02-05 MED ORDER — STERILE WATER FOR IRRIGATION IR SOLN
Status: DC | PRN
  Administered 2023-02-05: 1000 mL

## 2023-02-05 MED ORDER — SIMETHICONE 80 MG PO CHEW: 80.0000 mg | CHEWABLE_TABLET | ORAL | Status: DC | PRN

## 2023-02-05 MED ORDER — OXYCODONE-ACETAMINOPHEN 5-325 MG PO TABS
1.0000 | ORAL_TABLET | ORAL | Status: DC | PRN
  Filled 2023-02-05: qty 1

## 2023-02-05 SURGICAL SUPPLY — 31 items
APL PRP STRL LF DISP 70% ISPRP (MISCELLANEOUS) ×2
APL SKNCLS STERI-STRIP NONHPOA (GAUZE/BANDAGES/DRESSINGS) ×1
BENZOIN TINCTURE PRP APPL 2/3 (GAUZE/BANDAGES/DRESSINGS) IMPLANT
CHLORAPREP W/TINT 26 (MISCELLANEOUS) ×2 IMPLANT
CLAMP UMBILICAL CORD (MISCELLANEOUS) ×1 IMPLANT
CLOTH BEACON ORANGE TIMEOUT ST (SAFETY) ×1 IMPLANT
DRSG OPSITE POSTOP 4X10 (GAUZE/BANDAGES/DRESSINGS) ×1 IMPLANT
ELECT REM PT RETURN 9FT ADLT (ELECTROSURGICAL) ×1
ELECTRODE REM PT RTRN 9FT ADLT (ELECTROSURGICAL) ×1 IMPLANT
EXTRACTOR VACUUM M CUP 4 TUBE (SUCTIONS) IMPLANT
GAUZE SPONGE 4X4 12PLY STRL LF (GAUZE/BANDAGES/DRESSINGS) IMPLANT
GLOVE BIOGEL PI IND STRL 7.0 (GLOVE) ×1 IMPLANT
GLOVE SURG ORTHO 8.0 STRL STRW (GLOVE) ×1 IMPLANT
GOWN STRL REUS W/TWL LRG LVL3 (GOWN DISPOSABLE) ×2 IMPLANT
KIT ABG SYR 3ML LUER SLIP (SYRINGE) ×1 IMPLANT
NDL HYPO 25X5/8 SAFETYGLIDE (NEEDLE) ×1 IMPLANT
NEEDLE HYPO 25X5/8 SAFETYGLIDE (NEEDLE) ×1 IMPLANT
NS IRRIG 1000ML POUR BTL (IV SOLUTION) ×1 IMPLANT
PACK C SECTION WH (CUSTOM PROCEDURE TRAY) ×1 IMPLANT
PAD OB MATERNITY 4.3X12.25 (PERSONAL CARE ITEMS) ×1 IMPLANT
STRIP CLOSURE SKIN 1/2X4 (GAUZE/BANDAGES/DRESSINGS) IMPLANT
SUT MNCRL 0 VIOLET CTX 36 (SUTURE) ×3 IMPLANT
SUT MON AB 4-0 PS1 27 (SUTURE) ×1 IMPLANT
SUT MONOCRYL 0 CTX 36 (SUTURE) ×3
SUT PDS AB 1 CT  36 (SUTURE)
SUT PDS AB 1 CT 36 (SUTURE) IMPLANT
SUT VIC AB 1 CTX 36 (SUTURE)
SUT VIC AB 1 CTX36XBRD ANBCTRL (SUTURE) IMPLANT
TOWEL OR 17X24 6PK STRL BLUE (TOWEL DISPOSABLE) ×1 IMPLANT
TRAY FOLEY W/BAG SLVR 14FR LF (SET/KITS/TRAYS/PACK) ×1 IMPLANT
WATER STERILE IRR 1000ML POUR (IV SOLUTION) ×1 IMPLANT

## 2023-02-05 NOTE — Anesthesia Preprocedure Evaluation (Signed)
Anesthesia Evaluation  Patient identified by MRN, date of birth, ID band Patient awake    Reviewed: Allergy & Precautions, NPO status , Patient's Chart, lab work & pertinent test results  History of Anesthesia Complications (+) PONV and history of anesthetic complications  Airway Mallampati: III  TM Distance: >3 FB Neck ROM: Full    Dental  (+) Teeth Intact, Dental Advisory Given   Pulmonary asthma    Pulmonary exam normal breath sounds clear to auscultation       Cardiovascular negative cardio ROS Normal cardiovascular exam Rhythm:Regular Rate:Normal     Neuro/Psych  PSYCHIATRIC DISORDERS Anxiety Depression    negative neurological ROS     GI/Hepatic negative GI ROS, Neg liver ROS,,,  Endo/Other  negative endocrine ROS  Obesity   Renal/GU negative Renal ROS     Musculoskeletal negative musculoskeletal ROS (+)    Abdominal   Peds  Hematology  (+) Blood dyscrasia, anemia Plt 140k   Anesthesia Other Findings Day of surgery medications reviewed with the patient.  Reproductive/Obstetrics (+) Pregnancy Previous C-section x2                              Anesthesia Physical Anesthesia Plan  ASA: 3  Anesthesia Plan: Spinal   Post-op Pain Management:    Induction:   PONV Risk Score and Plan: 3 and Scopolamine patch - Pre-op, Dexamethasone and Ondansetron  Airway Management Planned: Natural Airway  Additional Equipment:   Intra-op Plan:   Post-operative Plan:   Informed Consent: I have reviewed the patients History and Physical, chart, labs and discussed the procedure including the risks, benefits and alternatives for the proposed anesthesia with the patient or authorized representative who has indicated his/her understanding and acceptance.     Dental advisory given  Plan Discussed with: CRNA, Anesthesiologist and Surgeon  Anesthesia Plan Comments: (Discussed risks and  benefits of and differences between spinal and general. Discussed risks of spinal including headache, backache, failure, bleeding, infection, and nerve damage. Patient consents to spinal. Questions answered. Coagulation studies and platelet count acceptable.)       Anesthesia Quick Evaluation

## 2023-02-05 NOTE — Anesthesia Procedure Notes (Signed)
Spinal  Patient location during procedure: OR Start time: 02/05/2023 9:53 AM End time: 02/05/2023 9:56 AM Reason for block: surgical anesthesia Staffing Performed: anesthesiologist  Anesthesiologist: Santa Lighter, MD Performed by: Santa Lighter, MD Authorized by: Santa Lighter, MD   Preanesthetic Checklist Completed: patient identified, IV checked, risks and benefits discussed, surgical consent, monitors and equipment checked, pre-op evaluation and timeout performed Spinal Block Patient position: sitting Prep: DuraPrep and site prepped and draped Patient monitoring: continuous pulse ox and blood pressure Approach: midline Location: L3-4 Injection technique: single-shot Needle Needle type: Pencan  Needle gauge: 24 G Assessment Sensory level: T6 Additional Notes Functioning IV was confirmed and monitors were applied. Sterile prep and drape, including hand hygiene, mask and sterile gloves were used. The patient was positioned and the spine was prepped. The skin was anesthetized with lidocaine.  Free flow of clear CSF was obtained prior to injecting local anesthetic into the CSF.  The spinal needle aspirated freely following injection.  The needle was carefully withdrawn.  The patient tolerated the procedure well. Consent was obtained prior to procedure with all questions answered and concerns addressed. Risks including but not limited to bleeding, infection, nerve damage, paralysis, failed block, inadequate analgesia, allergic reaction, high spinal, itching and headache were discussed and the patient wished to proceed.   Hoy Morn, MD

## 2023-02-05 NOTE — Transfer of Care (Signed)
Immediate Anesthesia Transfer of Care Note  Patient: Bailey Bailey  Procedure(s) Performed: REPEAT CESAREAN SECTION EDC: 02-12-23  ALLERG: NKDA  PREVIOUS X 2  Patient Location: PACU  Anesthesia Type:Spinal  Level of Consciousness: awake  Airway & Oxygen Therapy: Patient Spontanous Breathing  Post-op Assessment: Report given to RN and Post -op Vital signs reviewed and stable  Post vital signs: Reviewed and stable  Last Vitals:  Vitals Value Taken Time  BP 113/60 02/05/23 1100  Temp    Pulse 86 02/05/23 1101  Resp 21 02/05/23 1101  SpO2 100 % 02/05/23 1101  Vitals shown include unvalidated device data.  Last Pain:  Vitals:   02/05/23 0807  TempSrc: Oral  PainSc: 0-No pain         Complications: No notable events documented.

## 2023-02-05 NOTE — Lactation Note (Signed)
This note was copied from a baby's chart. Lactation Consultation Note  Patient Name: Bailey Bailey [id number] Date: 02/05/2023  Reason for consult: Initial assessment;Mother's request;Term  Age:32 hours P3, 39.0 GA, cesarean delivery  Mother requesting help with breastfeeding. She states baby has bottle fed with formula, twice.  Baby skin to skin with father. Hand expression taught. Mother's breast are soft, colostrum was not expressed during this attempt but mother did say she had breast changes during pregnancy.   Baby to breast. Discussed basic breastfeeding education regarding positioning and latch. Attempted to latch baby in football hold and baby suckled shallow in burst however she was mostly sleepy. After several attempts, diaper was changed and baby was placed skin to skin with dad. Instructed to observe baby for feeding cues, continue skin to skin while parents are fully awake and call for assist from LC/RN as needed.  Maternal Data Has patient been taught Hand Expression?: Yes Does the patient have breastfeeding experience prior to this delivery?: Yes How long did the patient breastfeed?: has 32 yo, 32 yo and breastfed both for a few months, pumped  Feeding Mother's Current Feeding Choice: Breast Milk  LATCH Score Latch: Too sleepy or reluctant, no latch achieved, no sucking elicited.  Audible Swallowing: None  Type of Nipple: Everted at rest and after stimulation  Comfort (Breast/Nipple): Soft / non-tender  Hold (Positioning): Assistance needed to correctly position infant at breast and maintain latch.  LATCH Score: 5   Interventions Interventions: Breast feeding basics reviewed;Assisted with latch;Skin to skin;Hand express;Breast compression;Adjust position;Support pillows;Position options;Education;LC Services brochure  Discharge Pump: DEBP;Personal  Consult Status Consult Status: Follow-up Date: 02/06/23   Gwenevere Abbot 02/05/2023, 6:20 PM

## 2023-02-05 NOTE — Op Note (Signed)
Cesarean Section Procedure Note  Pre-operative Diagnosis: IUP at 39 weeks, Previous c/s for repeat  Post-operative Diagnosis: same  Surgeon: Luz Lex   Assistants: Dr Clement Husbands, DO An experienced assistant was required given the standard of surgical care given the complexity of the case.  This assistant was needed for exposure, dissection, suctioning, retraction, instrument exchange, assisting with delivery with administration of fundal pressure, and for overall help during the procedure  Anesthesia: spinal  Procedure:  Low Segment Transverse cesarean section  Procedure Details  The patient was seen in the Holding Room. The risks, benefits, complications, treatment options, and expected outcomes were discussed with the patient.  The patient concurred with the proposed plan, giving informed consent.  The site of surgery properly noted/marked.. A Time Out was held and the above information confirmed.  After induction of anesthesia, the patient was draped and prepped in the usual sterile manner. A Pfannenstiel incision was made and carried down through the subcutaneous tissue to the fascia. Fascial incision was made and extended transversely. The fascia was separated from the underlying rectus tissue superiorly and inferiorly. The peritoneum was identified and entered. Peritoneal incision was extended longitudinally. The utero-vesical peritoneal reflection was incised transversely and the bladder flap was bluntly freed from the lower uterine segment. A low transverse uterine incision was made. Delivered from vertex presentation was a baby with Apgar scores of 8 at one minute and 9 at five minutes. After the umbilical cord was clamped and cut cord blood was obtained for evaluation. The placenta was removed intact and appeared normal. The uterine outline, tubes and ovaries appeared normal. The uterine incision was closed with running locked sutures of 0 monocryl and imbricated with 0 monocryl.  Hemostasis was observed. Lavage was carried out until clear. The peritoneum was then closed with 0 monocryl and rectus muscles plicated in the midline.  After hemostasis was assured, the fascia was then reapproximated with running sutures of 1 PDS. Irrigation was applied and after adequate hemostasis was assured, the skin was reapproximated with subcutaneous sutures using 4-0 monocryl.  Instrument, sponge, and needle counts were correct prior the abdominal closure and at the conclusion of the case. The patient received 2 grams cefotetan preoperatively.  Findings: Viable female  Estimated Blood Loss:  301cc         Specimens: Placenta was sent to labor and delivery         Complications:  None

## 2023-02-06 ENCOUNTER — Encounter (HOSPITAL_COMMUNITY): Payer: Self-pay | Admitting: Obstetrics and Gynecology

## 2023-02-06 ENCOUNTER — Other Ambulatory Visit: Payer: Self-pay

## 2023-02-06 LAB — CBC
HCT: 29.2 % — ABNORMAL LOW (ref 36.0–46.0)
Hemoglobin: 9.7 g/dL — ABNORMAL LOW (ref 12.0–15.0)
MCH: 28.9 pg (ref 26.0–34.0)
MCHC: 33.2 g/dL (ref 30.0–36.0)
MCV: 86.9 fL (ref 80.0–100.0)
Platelets: 168 10*3/uL (ref 150–400)
RBC: 3.36 MIL/uL — ABNORMAL LOW (ref 3.87–5.11)
RDW: 14.4 % (ref 11.5–15.5)
WBC: 15.8 10*3/uL — ABNORMAL HIGH (ref 4.0–10.5)
nRBC: 0 % (ref 0.0–0.2)

## 2023-02-06 MED ORDER — DIBUCAINE (PERIANAL) 1 % EX OINT: 1.0000 | TOPICAL_OINTMENT | CUTANEOUS | Status: DC | PRN

## 2023-02-06 MED ORDER — OXYCODONE HCL 5 MG PO TABS
10.0000 mg | ORAL_TABLET | ORAL | Status: DC | PRN
  Administered 2023-02-06 – 2023-02-08 (×5): 10 mg via ORAL
  Filled 2023-02-06 (×4): qty 2

## 2023-02-06 MED ORDER — WITCH HAZEL-GLYCERIN EX PADS: 1.0000 | MEDICATED_PAD | CUTANEOUS | Status: DC | PRN

## 2023-02-06 MED ORDER — ONDANSETRON HCL 4 MG PO TABS
4.0000 mg | ORAL_TABLET | Freq: Three times a day (TID) | ORAL | Status: DC | PRN
  Administered 2023-02-07: 4 mg via ORAL
  Filled 2023-02-06: qty 1

## 2023-02-06 MED ORDER — OXYCODONE HCL 5 MG PO TABS
5.0000 mg | ORAL_TABLET | ORAL | Status: DC | PRN
  Administered 2023-02-06 – 2023-02-07 (×4): 5 mg via ORAL
  Filled 2023-02-06 (×4): qty 1

## 2023-02-06 NOTE — Anesthesia Postprocedure Evaluation (Signed)
Anesthesia Post Note  Patient: Bailey Bailey  Procedure(s) Performed: REPEAT CESAREAN SECTION EDC: 02-12-23  ALLERG: NKDA  PREVIOUS X 2     Patient location during evaluation: PACU Anesthesia Type: Spinal Level of consciousness: awake, awake and alert and oriented Pain management: pain level controlled Vital Signs Assessment: post-procedure vital signs reviewed and stable Respiratory status: spontaneous breathing, nonlabored ventilation and respiratory function stable Cardiovascular status: blood pressure returned to baseline and stable Postop Assessment: no headache, no backache, spinal receding and no apparent nausea or vomiting Anesthetic complications: no   No notable events documented.  Last Vitals:  Vitals:   02/06/23 1400 02/06/23 2040  BP: 122/75 110/74  Pulse: 73 69  Resp: 18 18  Temp: 37 C 36.8 C  SpO2: 100% 97%    Last Pain:  Vitals:   02/06/23 2040  TempSrc: Oral  PainSc:                  Santa Lighter

## 2023-02-06 NOTE — Social Work (Addendum)
CSW received consult for hx of Anxiety, Depression and PPD.  CSW met with MOB to offer support and complete assessment.    CSW met with MOB at bedside and introduced CSW role. CSW observed MOB in bed holding the infant and FOB present at bedside. MOB presented calm and welcomed CSW visit. CSW offered MOB privacy. FOB left the room to allow MOB privacy. CSW inquired how MOB has felt since giving birth. MOB reported she has been feeling good. She shared the delivery went much better than she expected. This time she opted to have her sister in the delivery room as a support instead of FOB. MOB shared her sister played gospel music which made her feel good and calm. CSW acknowledged MOB efforts. CSW inquired about MOB mental health. MOB shared that she was diagnosed with depression and anxiety in 2013. She had postpartum depression with both older children as evidenced by her feeling "sluggish". She isolated herself and cried a lot. MOB reported she feels likes she is still going through PPD. In the past she took an SSRI medication to treat symptoms, however she stopped taking the medication because it took away from who she was, and family did not approve of it. MOB reported during the pregnancy she lost her dad. She and FOB were not together because he was not "acting right." She felt sad, alone and stressed. MOB reported that she went to therapy after the loss of her father but stopped going because the therapist looked at their watch often, and she felt rushed. So, during the pregnancy to cope she focused on the positive things in her life, attended church and read her bible. MOB shared overall she has been in a good place mentally since having the baby. MOB shared that she normally bonds with her baby through breastfeeding, but her milk has not come in, so she is using formula to feed the infant. She is looking forward to seeing the lactation team later today to discuss alternatives. CSW encouraged MOB efforts  and praised her ability to use implement her coping mechanisms.   CSW encouraged MOB to try other therapist in the area. CSW provided education regarding the baby blues period vs. perinatal mood disorders, discussed treatment and gave resources for mental health follow up. MOB was receptive of the resources and willing to follow up on her own. CSW recommended MOB complete a self-evaluation during the postpartum time period using the New Mom Checklist from Postpartum Progress and encouraged MOB to contact a medical professional if symptoms are noted at any time. MOB reported that she has a good relationship with OBGYN provider, Dr. Matthew Saras and feels very comfortable reaching out to him to discuss her mental health treatment options if concerns arise. MOB identified her mom; sister and the bible as supports. CSW assessed MOB for safety. MOB denied SI/HI and domestic violence concerns.   CSW provided review of Sudden Infant Death Syndrome (SIDS) precautions. MOB reported understanding. She stated that she has the essential items to care but will need assistance with wipes. MOB reported that she will be able to purchase a few the infant. CSW discussed community resources. MOB stated that she agreed to [facility] and her initial visit is 02/20/23. CSW encouraged MOB to inform them of any additional items she will need to care for the infant. MOB has chosen Nurse, learning disability for the infant's follow up.   CSW identifies no further need for intervention and no barriers to discharge at this time.  Kathrin Greathouse, MSW, LCSW Women's and [place] Worker  02/06/2023  2:23 PM

## 2023-02-06 NOTE — Progress Notes (Signed)
Postpartum Progress Note  S: Having increased pain this morning after changing her clothes and moving around more, improved with pain medications. Lochia appropriate. No subjective fevers/chills, Cp, or SOB. Ambulating. Tolerating PO intake.   O:     02/06/2023    5:29 AM 02/06/2023   12:25 AM 02/05/2023    3:29 PM  Vitals with BMI  Systolic [id number] 93 96  Diastolic 80 56 63  Pulse 62 64 64   Gen: NAD, A&O Pulm: NWOB Abd: soft, appropriately ttp, fundus firm and below Umb. Incision intact, some strikethrough on honeycomb. Ext: No evidence of DVT, trace edema b/l  UOP adequate.   Labs Recent Results (from the past 2160 hour(s))  CBC with Differential/Platelet     Status: Abnormal   Collection Time: 11/13/22  1:47 AM  Result Value Ref Range   WBC 12.9 (H) 4.0 - 10.5 K/uL   RBC 3.61 (L) 3.87 - 5.11 MIL/uL   Hemoglobin 10.6 (L) 12.0 - 15.0 g/dL   HCT 33.1 (L) 36.0 - 46.0 %   MCV 91.7 80.0 - 100.0 fL   MCH 29.4 26.0 - 34.0 pg   MCHC 32.0 30.0 - 36.0 g/dL   RDW 13.3 11.5 - 15.5 %   Platelets 215 150 - 400 K/uL   nRBC 0.0 0.0 - 0.2 %   Neutrophils Relative % 78 %   Neutro Abs 10.1 (H) 1.7 - 7.7 K/uL   Lymphocytes Relative 14 %   Lymphs Abs 1.8 0.7 - 4.0 K/uL   Monocytes Relative 6 %   Monocytes Absolute 0.7 0.1 - 1.0 K/uL   Eosinophils Relative 1 %   Eosinophils Absolute 0.1 0.0 - 0.5 K/uL   Basophils Relative 0 %   Basophils Absolute 0.0 0.0 - 0.1 K/uL   Immature Granulocytes 1 %   Abs Immature Granulocytes 0.07 0.00 - 0.07 K/uL    Comment: Performed at [facility], [address], [place], [place] [address]  Comprehensive metabolic panel     Status: Abnormal   Collection Time: 11/13/22  1:47 AM  Result Value Ref Range   Sodium 135 135 - 145 mmol/L   Potassium 3.3 (L) 3.5 - 5.1 mmol/L   Chloride 102 98 - 111 mmol/L   CO2 21 (L) 22 - 32 mmol/L   Glucose, Bld 110 (H) 70 - 99 mg/dL    Comment: Glucose reference range applies only to samples taken after fasting for at  least 8 hours.   BUN 5 (L) 6 - 20 mg/dL   Creatinine, Ser 0.46 0.44 - 1.00 mg/dL   Calcium 9.0 8.9 - 10.3 mg/dL   Total Protein 6.8 6.5 - 8.1 g/dL   Albumin 2.8 (L) 3.5 - 5.0 g/dL   AST 20 15 - 41 U/L   ALT 22 0 - 44 U/L   Alkaline Phosphatase 66 38 - 126 U/L   Total Bilirubin 0.5 0.3 - 1.2 mg/dL   GFR, Estimated >60 >60 mL/min    Comment: (NOTE) Calculated using the CKD-EPI Creatinine Equation (2021)    Anion gap 12 5 - 15    Comment: Performed at [facility] [address] [place], [place] [address]  D-dimer, quantitative     Status: Abnormal   Collection Time: 11/13/22  1:47 AM  Result Value Ref Range   D-Dimer, Quant 1.30 (H) 0.00 - 0.50 ug/mL-FEU    Comment: (NOTE) At the manufacturer cut-off value of 0.5 g/mL FEU, this assay has a negative predictive value  of 95-100%.This assay is intended for use in conjunction with a clinical pretest probability (PTP) assessment model to exclude pulmonary embolism (PE) and deep venous thrombosis (DVT) in outpatients suspected of PE or DVT. Results should be correlated with clinical presentation. Performed at [facility], [place] [address] [place], [place] [address]   C-reactive protein     Status: Abnormal   Collection Time: 11/13/22  4:09 AM  Result Value Ref Range   CRP 5.3 (H) <1.0 mg/dL    Comment: Performed at [facility] [address], [place], [place] [address]  Sedimentation rate     Status: Abnormal   Collection Time: 11/13/22  4:09 AM  Result Value Ref Range   Sed Rate 54 (H) 0 - 22 mm/hr    Comment: Performed at [facility] [address] [place], [place] [address]  Resp panel by RT-PCR (RSV, Flu A&B, Covid) Anterior Nasal Swab     Status: Abnormal   Collection Time: 12/10/22  2:53 PM   Specimen: Anterior Nasal Swab  Result Value Ref Range   SARS Coronavirus 2 by RT PCR NEGATIVE NEGATIVE    Comment: (NOTE) SARS-CoV-2 target nucleic acids are NOT DETECTED.  The SARS-CoV-2 RNA is  generally detectable in upper respiratory specimens during the acute phase of infection. The lowest concentration of SARS-CoV-2 viral copies this assay can detect is 138 copies/mL. A negative result does not preclude SARS-Cov-2 infection and should not be used as the sole basis for treatment or other patient management decisions. A negative result may occur with  improper specimen collection/handling, submission of specimen other than nasopharyngeal swab, presence of viral mutation(s) within the areas targeted by this assay, and inadequate number of viral copies(<138 copies/mL). A negative result must be combined with clinical observations, patient history, and epidemiological information. The expected result is Negative.  Fact Sheet for Patients:  EntrepreneurPulse.com.au  Fact Sheet for Healthcare Providers:  IncredibleEmployment.be  This test is no t yet approved or cleared by the [place] FDA and  has been authorized for detection and/or diagnosis of SARS-CoV-2 by FDA under an Emergency Use Authorization (EUA). This EUA will remain  in effect (meaning this test can be used) for the duration of the COVID-19 declaration under Section 564(b)(1) of the Act, 21 U.S.C.section 360bbb-3(b)(1), unless the authorization is terminated  or revoked sooner.       Influenza A by PCR POSITIVE (A) NEGATIVE   Influenza B by PCR NEGATIVE NEGATIVE    Comment: (NOTE) The Xpert Xpress SARS-CoV-2/FLU/RSV plus assay is intended as an aid in the diagnosis of influenza from Nasopharyngeal swab specimens and should not be used as a sole basis for treatment. Nasal washings and aspirates are unacceptable for Xpert Xpress SARS-CoV-2/FLU/RSV testing.  Fact Sheet for Patients: EntrepreneurPulse.com.au  Fact Sheet for Healthcare Providers: IncredibleEmployment.be  This test is not yet approved or cleared by the [place]  FDA and has been authorized for detection and/or diagnosis of SARS-CoV-2 by FDA under an Emergency Use Authorization (EUA). This EUA will remain in effect (meaning this test can be used) for the duration of the COVID-19 declaration under Section 564(b)(1) of the Act, 21 U.S.C. section 360bbb-3(b)(1), unless the authorization is terminated or revoked.     Resp Syncytial Virus by PCR NEGATIVE NEGATIVE    Comment: (NOTE) Fact Sheet for Patients: EntrepreneurPulse.com.au  Fact Sheet for Healthcare Providers: IncredibleEmployment.be  This test is not yet approved or cleared by the [place] FDA and has  been authorized for detection and/or diagnosis of SARS-CoV-2 by FDA under an Emergency Use Authorization (EUA). This EUA will remain in effect (meaning this test can be used) for the duration of the COVID-19 declaration under Section 564(b)(1) of the Act, 21 U.S.C. section 360bbb-3(b)(1), unless the authorization is terminated or revoked.  Performed at [facility], [place] [address], [place], [place] [address]   Urinalysis, Routine w reflex microscopic Urine, Clean Catch     Status: Abnormal   Collection Time: 12/10/22  3:27 PM  Result Value Ref Range   Color, Urine YELLOW YELLOW   APPearance CLEAR CLEAR   Specific Gravity, Urine 1.020 1.005 - 1.030   pH 7.0 5.0 - 8.0   Glucose, UA NEGATIVE NEGATIVE mg/dL   Hgb urine dipstick NEGATIVE NEGATIVE   Bilirubin Urine NEGATIVE NEGATIVE   Ketones, ur >80 (A) NEGATIVE mg/dL   Protein, ur NEGATIVE NEGATIVE mg/dL   Nitrite NEGATIVE NEGATIVE   Leukocytes,Ua SMALL (A) NEGATIVE    Comment: Performed at [facility] [address] [place], [place] [address]  Urinalysis, Microscopic (reflex)     Status: Abnormal   Collection Time: 12/10/22  3:27 PM  Result Value Ref Range   RBC / HPF 0-5 0 - 5 RBC/hpf   WBC, UA 0-5 0 - 5 WBC/hpf   Bacteria, UA RARE (A) NONE SEEN   Squamous Epithelial / HPF  0-5 0 - 5   Mucus PRESENT     Comment: Performed at [facility], [place] [address] [place], [place] [address]  GC/Chlamydia probe amp ([place])not at [facility]     Status: None   Collection Time: 01/13/23 12:18 PM  Result Value Ref Range   Chlamydia Negative    Neisseria Gonorrhea Negative    Comment Normal Reference [place] Chlamydia - Negative    Comment      Normal Reference Range Neisseria Gonorrhea - Negative  Wet prep, genital     Status: Abnormal   Collection Time: 01/13/23 12:18 PM   Specimen: PATH Cytology Ancillary Only  Result Value Ref Range   Yeast Wet Prep HPF POC PRESENT (A) NONE SEEN   Trich, Wet Prep NONE SEEN NONE SEEN   Clue Cells Wet Prep HPF POC PRESENT (A) NONE SEEN   WBC, Wet Prep HPF POC >=10 (A) <10   Sperm NONE SEEN     Comment: Performed at [facility], [place] [address], [place], [place] [address]  OB RESULT CONSOLE Group B Strep     Status: None   Collection Time: 01/16/23 12:00 AM  Result Value Ref Range   GBS Positive   CBC     Status: Abnormal   Collection Time: 02/04/23  9:46 AM  Result Value Ref Range   WBC 12.6 (H) 4.0 - 10.5 K/uL   RBC 3.79 (L) 3.87 - 5.11 MIL/uL   Hemoglobin 10.9 (L) 12.0 - 15.0 g/dL   HCT 33.0 (L) 36.0 - 46.0 %   MCV 87.1 80.0 - 100.0 fL   MCH 28.8 26.0 - 34.0 pg   MCHC 33.0 30.0 - 36.0 g/dL   RDW 14.7 11.5 - 15.5 %   Platelets 140 (L) 150 - 400 K/uL   nRBC 0.0 0.0 - 0.2 %    Comment: Performed at [facility], [place] [address], [place], [place] [address]  RPR     Status: None   Collection Time: 02/04/23  9:46 AM  Result Value Ref Range  RPR Ser Ql NON REACTIVE NON REACTIVE    Comment: Performed at [facility] [address], [place], [place] [address]  Type and screen [place]     Status: None   Collection Time: 02/04/23  9:51 AM  Result Value Ref Range   ABO/RH(D) A POS    Antibody Screen NEG    Sample Expiration      02/07/2023,2359 Performed at [facility],  [place] [address], [place], [place] [address]   CBC     Status: Abnormal   Collection Time: 02/06/23  5:50 AM  Result Value Ref Range   WBC 15.8 (H) 4.0 - 10.5 K/uL   RBC 3.36 (L) 3.87 - 5.11 MIL/uL   Hemoglobin 9.7 (L) 12.0 - 15.0 g/dL   HCT 29.2 (L) 36.0 - 46.0 %   MCV 86.9 80.0 - 100.0 fL   MCH 28.9 26.0 - 34.0 pg   MCHC 33.2 30.0 - 36.0 g/dL   RDW 14.4 11.5 - 15.5 %   Platelets 168 150 - 400 K/uL   nRBC 0.0 0.0 - 0.2 %    Comment: Performed at [facility], [place] [address] [place], [place] [address]     A/P:  POD1 s/p rCS, doing well pp. AFVSS. Benign exam. Acute blood loss anemia - continue iron in PNV Change honeycomb dressing today. Plan for d/c POD#2 or 3.   Hurshel Party, MD

## 2023-02-06 NOTE — Plan of Care (Signed)
  Problem: Education: Goal: Knowledge of General Education information will improve Description: Including pain rating scale, medication(s)/side effects and non-pharmacologic comfort measures Outcome: Progressing   Problem: Health Behavior/Discharge Planning: Goal: Ability to manage health-related needs will improve Outcome: Progressing   Problem: Clinical Measurements: Goal: Ability to maintain clinical measurements within normal limits will improve Outcome: Progressing Goal: Will remain free from infection Outcome: Progressing Goal: Diagnostic test results will improve Outcome: Progressing Goal: Respiratory complications will improve Outcome: Progressing Goal: Cardiovascular complication will be avoided Outcome: Progressing   Problem: Activity: Goal: Risk for activity intolerance will decrease Outcome: Progressing   Problem: Nutrition: Goal: Adequate nutrition will be maintained Outcome: Progressing   Problem: Coping: Goal: Level of anxiety will decrease Outcome: Progressing   Problem: Elimination: Goal: Will not experience complications related to bowel motility Outcome: Progressing Goal: Will not experience complications related to urinary retention Outcome: Progressing   Problem: Pain Managment: Goal: General experience of comfort will improve Outcome: Progressing   Problem: Safety: Goal: Ability to remain free from injury will improve Outcome: Progressing   Problem: Skin Integrity: Goal: Risk for impaired skin integrity will decrease Outcome: Progressing   Problem: Education: Goal: Knowledge of the prescribed therapeutic regimen will improve Outcome: Progressing Goal: Understanding of sexual limitations or changes related to disease process or condition will improve Outcome: Progressing Goal: Individualized Educational Video(s) Outcome: Progressing   Problem: Self-Concept: Goal: Communication of feelings regarding changes in body function or  appearance will improve Outcome: Progressing   Problem: Skin Integrity: Goal: Demonstration of wound healing without infection will improve Outcome: Progressing   Problem: Education: Goal: Knowledge of condition will improve Outcome: Progressing Goal: Individualized Educational Video(s) Outcome: Progressing Goal: Individualized Newborn Educational Video(s) Outcome: Progressing   Problem: Activity: Goal: Will verbalize the importance of balancing activity with adequate rest periods Outcome: Progressing Goal: Ability to tolerate increased activity will improve Outcome: Progressing   Problem: Coping: Goal: Ability to identify and utilize available resources and services will improve Outcome: Progressing   Problem: Life Cycle: Goal: Chance of risk for complications during the postpartum period will decrease Outcome: Progressing   Problem: Role Relationship: Goal: Ability to demonstrate positive interaction with newborn will improve Outcome: Progressing   Problem: Skin Integrity: Goal: Demonstration of wound healing without infection will improve Outcome: Progressing   Problem: Education: Goal: Knowledge of condition will improve Outcome: Progressing Goal: Individualized Educational Video(s) Outcome: Progressing Goal: Individualized Newborn Educational Video(s) Outcome: Progressing   Problem: Activity: Goal: Will verbalize the importance of balancing activity with adequate rest periods Outcome: Progressing Goal: Ability to tolerate increased activity will improve Outcome: Progressing   Problem: Coping: Goal: Ability to identify and utilize available resources and services will improve Outcome: Progressing   Problem: Life Cycle: Goal: Chance of risk for complications during the postpartum period will decrease Outcome: Progressing   Problem: Role Relationship: Goal: Ability to demonstrate positive interaction with newborn will improve Outcome: Progressing    Problem: Skin Integrity: Goal: Demonstration of wound healing without infection will improve Outcome: Progressing   

## 2023-02-06 NOTE — Social Work (Signed)
CSW met with MOB to follow up regarding her Flavia Shipper (14) and answered yes to question 10. MOB informed CSW that she answered the [place] based on the entire pregnancy not the past seven days. She denied having thoughts of wanting to harm self or others. MOB requested to retake the [place]. CSW notified RN, Caryl Pina.   Kathrin Greathouse, MSW, LCSW Women's and [place] Worker  02/06/2023  2:35 PM

## 2023-02-06 NOTE — Lactation Note (Signed)
This note was copied from a baby's chart. Lactation Consultation Note  Patient Name: Bailey Bailey [id number] Date: 02/06/2023 Reason for consult: Mother's request;Follow-up assessment Age:32 hours Birth parent requested [place] services due to BF questions, Per Birth Parent she latched infant at the breast for 10 minutes and then gave infant 30 mls of formula at 0000 am prior to [facility] entering the room. LC discussed infant is learning how to BF and latch infant first every feeding to help stimulate and establish milk supply, if infant is still cuing after latching infant on 1st breast to offer 2nd breast during the same feeding. If she needs latch assistance to call [place] services. LC discussed infant's small tummy size the first few days of life and when supplementing infant with formula to pace feed infant, if supplementing with BF limit intake to 5- 7 mls per feeding but not to  offer 30 mls of formula per feeding  on day 1. LC gave Birth Parent a BF supplemental sheet to follow when breastfeeding and supplementing with formula. Birth Parent knows how to hand express and colostrum is present.  Maternal Data    Feeding Mother's Current Feeding Choice: Breast Milk and Formula  LATCH Score                    Lactation Tools Discussed/Used    Interventions Interventions: Education;Pace feeding  Discharge    Consult Status Consult Status: Follow-up Date: 02/06/23 Follow-up type: In-patient    Eulis Canner 02/06/2023, 1:50 AM

## 2023-02-07 ENCOUNTER — Encounter (HOSPITAL_COMMUNITY): Payer: Self-pay | Admitting: Anesthesiology

## 2023-02-07 MED ORDER — BUTALBITAL-APAP-CAFFEINE 50-325-40 MG PO TABS
1.0000 | ORAL_TABLET | ORAL | Status: DC | PRN
  Administered 2023-02-07 – 2023-02-08 (×4): 1 via ORAL
  Filled 2023-02-07 (×4): qty 1

## 2023-02-07 MED ORDER — BISACODYL 10 MG RE SUPP
10.0000 mg | Freq: Every day | RECTAL | Status: DC | PRN
  Filled 2023-02-07: qty 1

## 2023-02-07 NOTE — Progress Notes (Signed)
Subjective: Postpartum Day 2: Cesarean Delivery Patient reports tolerating PO and no problems voiding.  Patient states she is "not doing too well this morning."  Patient c/o gas pain; no flatus yet.  She is ambulating and taking Mylicon.   She also c/o head pressure which she described as feeling like she is hanging upside down on monkey bars.  She does report mild HA when sitting up.  Patient feels very anxious and previously has taken sertraline.    Objective: Vital signs in last 24 hours: Temp:  [98.2 F (36.8 C)-98.7 F (37.1 C)] 98.7 F (37.1 C) (02/15 0516) Pulse Rate:  [69-73] 69 (02/15 0516) Resp:  [16-18] 16 (02/15 0516) BP: (110-122)/(60-75) 110/60 (02/15 0516) SpO2:  [97 %-100 %] 97 % (02/15 0516)  Physical Exam:  General: alert, cooperative, and appears stated age 6: appropriate Uterine Fundus: firm Incision: healing well, no significant drainage DVT Evaluation: No evidence of DVT seen on physical exam. Negative Homan's sign. No cords or calf tenderness.  Recent Labs    02/04/23 0946 02/06/23 0550  HGB 10.9* 9.7*  HCT 33.0* 29.2*    Assessment/Plan: Status post Cesarean section. Doing well postoperatively.  Continue current care. Gas pain-continue to ambulate and Mylicon Positional HA-mild at this time; CTM and consult anesthesia prn concern for spinal HA. Anxiety-offered patient sertraline and she declines at this time.  She will consider Defer d/c until tomorrow   Linda Hedges, DO 02/07/2023, 8:22 AM

## 2023-02-07 NOTE — Progress Notes (Addendum)
Called Dr. Lynnette Caffey physician on called concerning patient complaint of neck and head pain and pressure. Patient stated that the pain had increased after given 650 mg of tylenol at 0713. 10 mg of oxycodone was administered at 1045. Patient stated that the pain has decreased. Dr. Lynnette Caffey suggested that patient following up/consult with anesthesia for possible spinal HA.

## 2023-02-07 NOTE — Social Work (Signed)
CSW visited with MOB this morning to check in. CSW walked in the room and observed MOB with her eyes closed and the infant asleep next to her in the bed. MOB awakened at her name being called by CSW. CSW gently reminded MOB about placing infant in the bassinet. MOB reported she had just closed her eyes. MOB expressed she felt "pressure in head" and had just taken Tylenol that helped her symptoms. CSW inquired about MOB mood. MOB reported that she has been feeling alright. CSW assessed MOB for safety. MOB denied SI/HI concerns. CSW offered support and encouraged MOB to reach out to RN and the provider for support if needed. MOB reported understanding and had no additional concerns.   Kathrin Greathouse, MSW, LCSW Women's and [place] Worker  [phone] 02/07/2023  2:09 PM

## 2023-02-07 NOTE — Progress Notes (Signed)
Called to evaluate pt for possible PDPH. Pt states that she was up walking around a lot yesterday and her headache started last night. States that her headache starts in the back of her neck and comes around to her forehead. She has some mild photophobia and is nauseated but is also constipated and has not passed gas yet from her c-section and is constipated. She states it is worse when sitting and standing and better when lying down. She is laying down eating crackers and sipping on coke on interview. Discussed that her headache sounds consistent with PDPH and discussed various treatments with conservative measures including medicines for headaches, fluids, caffeine, as well as more aggressive treatments such as an epidural blood patch. We discussed risks and benefits to both including bleeding, infection, nerve damage, and back pain with the EBP. At this time pt would like to pursue conservative treatments only. Will prescribe prn fioricet, and encourage additional caffeine and oral hydration as tolerated with constipation and nausea. Pt was agreeable to this plan.  Deatra Canter, MD

## 2023-02-07 NOTE — Progress Notes (Signed)
Dr. Verita Lamb Notified of S/S of potential spinal headache in patient. Dr. To come assess at bedside.

## 2023-02-08 MED ORDER — BUTALBITAL-APAP-CAFFEINE 50-325-40 MG PO TABS: 1.0000 | ORAL_TABLET | ORAL | 0 refills | Status: DC | PRN

## 2023-02-08 MED ORDER — LACTATED RINGERS IV SOLN: INTRAVENOUS | Status: DC

## 2023-02-08 MED ORDER — OXYCODONE HCL 5 MG PO TABS: 5.0000 mg | ORAL_TABLET | Freq: Four times a day (QID) | ORAL | 0 refills | Status: DC | PRN

## 2023-02-08 MED ORDER — IBUPROFEN 600 MG PO TABS: 600.0000 mg | ORAL_TABLET | Freq: Four times a day (QID) | ORAL | 0 refills | Status: DC | PRN

## 2023-02-08 MED ORDER — ACETAMINOPHEN 325 MG PO TABS: 650.0000 mg | ORAL_TABLET | Freq: Four times a day (QID) | ORAL | 0 refills | Status: AC | PRN

## 2023-02-08 NOTE — Progress Notes (Signed)
Pt seen this afternoon for headaches yesterday and continuing overnight. Currently patient is laying in bed with HOB elevated to about 45 degrees and states her headache is almost gone when she is able to rest it back against something, regardless of the level of incline of the HOB. Otherwise, sitting and standing up cause her headaches to worsen. She has had multiple episodes of emesis when standing as well. Her ability to raise the [facility] without issues is the only piece that confounds the PDPH picture for me, but I offered the patient an epidural blood patch with the caveat that it may not help the headaches if they are from another source. She is currently receiving IVF at 125cc/h and states that the fioricet she has been receiving is helping. Pt would like time to discuss further with her mother.   I also explained that if she decides to go home today, she can always return to [facility] at any point in time and we can do an epidural blood patch for here there without having to keep her overnight.   All questions answered. Please call w/ questions or concerns.

## 2023-02-08 NOTE — Lactation Note (Signed)
This note was copied from a baby's chart. Lactation Consultation Note  Patient Name: Bailey Bailey [id number] Date: 02/08/2023 Reason for consult: Follow-up assessment;Difficult latch Age:32 hours  LC entered the room and the infant was in the bassinet.  Per the birth parent, she has been trying to breastfeed, but has not been very successful.  She stated that she was not feeling very well, but would like to continue to try with breastfeeding.  [facility] sent a message to the outpatient lactation consultant per the birth parent's request.  LC reviewed engorgement, breast care, warning signs, infant I/O, and outpatient lactation.  The birth parent had no further questions or concerns.   Infant Feeding Plan:  Breastfeed 8+ times per day according to feeding cues.  Pump and feed expressed milk to the infant via a bottle if she will not latch.  Put the infant to the breast prior to supplementing.  Supplement according to supplementation guidelines.  Watch infant output and call the pediatrician with concerns.  Call outpatient [facility] for assistance with breastfeeding.   Maternal Data    Feeding    LATCH Score                    Lactation Tools Discussed/Used    Interventions Interventions: Breast feeding basics reviewed;Education  Discharge Discharge Education: Engorgement and breast care;Warning signs for feeding baby;Outpatient Epic message sent  Consult Status Consult Status: Complete Date: 02/08/23 Follow-up type: Call as needed    Lysbeth Penner 02/08/2023, 11:17 AM

## 2023-02-08 NOTE — Lactation Note (Signed)
This note was copied from a baby's chart. Lactation Consultation Note  Patient Name: Bailey Bailey [id number] Date: 02/08/2023   Age:32 hours Attempted to see mom and everyone in rm. Was sleeping.  Maternal Data    Feeding    LATCH Score                    Lactation Tools Discussed/Used    Interventions    Discharge    Consult Status      Theodoro Kalata 02/08/2023, 3:16 AM

## 2023-02-08 NOTE — Progress Notes (Signed)
Subjective: Postpartum Day 3: Cesarean Delivery Patient reports tolerating PO, + flatus, and no problems voiding.   HA better today, only mild when she is up. Passing flatus, tolerating regular diet and voiding without difficulty Objective: Vital signs in last 24 hours: Temp:  [98.1 F (36.7 C)-98.6 F (37 C)] 98.6 F (37 C) (02/15 2036) Pulse Rate:  [62-73] 73 (02/15 2036) Resp:  [16-18] 16 (02/15 2036) BP: (112-117)/(58-72) 112/58 (02/15 2036) SpO2:  [99 %] 99 % (02/15 2036)  Physical Exam:  General: alert, cooperative, and no distress Lochia: appropriate Uterine Fundus: firm Incision: healing well DVT Evaluation: No evidence of DVT seen on physical exam. Abdomen soft, non distended, BS+ Recent Labs    02/06/23 0550  HGB 9.7*  HCT 29.2*    Assessment/Plan: Status post Cesarean section. Doing well postoperatively.  Discharge home with standard precautions and return to clinic in 4-6 weeks. She wants to go home. Will plan on a mid day discharge to assure she is doing well. Fioricet prn, oxycodone prn schedule discussed Allena Katz, MD 02/08/2023, 6:44 AM

## 2023-02-08 NOTE — Discharge Summary (Signed)
Postpartum Discharge Summary  Date of Service updated 02/08/23     Patient Name: Bailey Bailey DOB: 26-Aug-1991 MRN: [id number]  Date of admission: 02/05/2023 Delivery date:02/05/2023  Delivering provider: Louretta Shorten  Date of discharge: 02/08/2023  Admitting diagnosis: Previous cesarean section [Z98.891] Cesarean delivery delivered [O82] Intrauterine pregnancy: [redacted]w[redacted]d    Secondary diagnosis:  Principal Problem:   Previous cesarean section Active Problems:   Cesarean delivery delivered  Additional problems: spinal HA    Discharge diagnosis: Term Pregnancy Delivered                                              Post partum procedures: Augmentation:  Complications: None  Hospital course: Sceduled C/S   32y.o. yo [id number] admitted to the hospital 02/05/2023 for scheduled cesarean section with the following indication:Elective Repeat.Delivery details are as follows:  Membrane Rupture Time/Date: 10:19 AM ,02/05/2023   Delivery Method:C-Section, Low Transverse  Details of operation can be found in separate operative note.  Patient had a postpartum course complicated by spinal HA with conservative management.  She is ambulating, tolerating a regular diet, passing flatus, and urinating well. She has relief of HA with Fioricet. Patient is discharged home in stable condition on  02/08/23        Newborn Data: Birth date:02/05/2023  Birth time:10:20 AM  Gender:Female  Living status:Living  Apgars:8 ,9  Weight:3380 g     Magnesium Sulfate received: No BMZ received: No Rhophylac:No MMR:No T-DaP:Given prenatally Flu: No Transfusion:No  Physical exam  Vitals:   02/07/23 0516 02/07/23 1132 02/07/23 1457 02/07/23 2036  BP: 110/60  117/72 (!) 112/58  Pulse: 69  62 73  Resp: 16  18 16  $ Temp: 98.7 F (37.1 C) 98.1 F (36.7 C) 98.1 F (36.7 C) 98.6 F (37 C)  TempSrc: Oral Oral Oral Oral  SpO2: 97%   99%  Weight:      Height:       General: alert, cooperative,  and no distress Lochia: appropriate Uterine Fundus: firm Incision: Healing well with no significant drainage DVT Evaluation: No evidence of DVT seen on physical exam. Labs: Lab Results  Component Value Date   WBC 15.8 (H) 02/06/2023   HGB 9.7 (L) 02/06/2023   HCT 29.2 (L) 02/06/2023   MCV 86.9 02/06/2023   PLT 168 02/06/2023      Latest Ref Rng & Units 11/13/2022    1:47 AM  CMP  Glucose 70 - 99 mg/dL 110   BUN 6 - 20 mg/dL 5   Creatinine 0.44 - 1.00 mg/dL 0.46   Sodium 135 - 145 mmol/L 135   Potassium 3.5 - 5.1 mmol/L 3.3   Chloride 98 - 111 mmol/L 102   CO2 22 - 32 mmol/L 21   Calcium 8.9 - 10.3 mg/dL 9.0   Total Protein 6.5 - 8.1 g/dL 6.8   Total Bilirubin 0.3 - 1.2 mg/dL 0.5   Alkaline Phos 38 - 126 U/L 66   AST 15 - 41 U/L 20   ALT 0 - 44 U/L 22    Edinburgh Score:    02/06/2023    2:56 PM  Edinburgh Postnatal Depression Scale Screening Tool  I have been able to laugh and see the funny side of things. 0  I have looked forward with enjoyment to things. 0  I have  blamed myself unnecessarily when things went wrong. 2  I have been anxious or worried for no good reason. 2  I have felt scared or panicky for no good reason. 2  Things have been getting on top of me. 1  I have been so unhappy that I have had difficulty sleeping. 0  I have felt sad or miserable. 0  I have been so unhappy that I have been crying. 1  The thought of harming myself has occurred to me. 1  Edinburgh Postnatal Depression Scale Total 9      After visit meds:  Allergies as of 02/08/2023   No Known Allergies      Medication List     STOP taking these medications    terconazole 0.4 % vaginal cream Commonly known as: TERAZOL 7   triamcinolone cream 0.1 % Commonly known as: KENALOG       TAKE these medications    acetaminophen 325 MG tablet Commonly known as: TYLENOL Take 2 tablets (650 mg total) by mouth every 6 (six) hours as needed for mild pain (temperature > 101.5.).    butalbital-acetaminophen-caffeine 50-325-40 MG tablet Commonly known as: FIORICET Take 1 tablet by mouth every 4 (four) hours as needed for headache.   ibuprofen 600 MG tablet Commonly known as: ADVIL Take 1 tablet (600 mg total) by mouth every 6 (six) hours as needed.   oxyCODONE 5 MG immediate release tablet Commonly known as: Oxy IR/ROXICODONE Take 1 tablet (5 mg total) by mouth every 6 (six) hours as needed for moderate pain.   Prenatal Vitamin 27-0.8 MG Tabs Take 1 tablet by mouth daily.         Discharge home in stable condition Infant Feeding: Breast Infant Disposition:home with mother Discharge instruction: per After Visit Summary and Postpartum booklet. Activity: Advance as tolerated. Pelvic rest for 6 weeks.  Diet: routine diet Anticipated Birth Control: Unsure Postpartum Appointment:6 weeks Additional Postpartum F/U:  Future Appointments:No future appointments. Follow up Visit:      02/08/2023 Allena Katz, MD

## 2023-02-08 NOTE — Progress Notes (Signed)
Took oxycodone 68m for HA this am Also Zofran > N/V about 15 minutes later  D/W patient. Continue IV fluids and observation. She wants to go home. I D/W that I want her to be safe when she is discharged. She states she understands.

## 2023-02-16 ENCOUNTER — Telehealth (HOSPITAL_COMMUNITY): Payer: Self-pay

## 2023-02-16 NOTE — Telephone Encounter (Signed)
Patient did not answer phone call. Voicemail left for patient.   [facility] [employer]   02/16/23,1415

## 2023-10-03 ENCOUNTER — Ambulatory Visit (HOSPITAL_COMMUNITY)
Admission: EM | Admit: 2023-10-03 | Discharge: 2023-10-03 | Disposition: A | Discharge status: Home / Self Care | Payer: Medicaid Other | Attending: Emergency Medicine | Admitting: Emergency Medicine

## 2023-10-03 ENCOUNTER — Encounter (HOSPITAL_COMMUNITY): Payer: Self-pay | Admitting: Emergency Medicine

## 2023-10-03 DIAGNOSIS — S39012A Strain of muscle, fascia and tendon of lower back, initial encounter: Secondary | ICD-10-CM | POA: Diagnosis not present

## 2023-10-03 DIAGNOSIS — M5432 Sciatica, left side: Secondary | ICD-10-CM

## 2023-10-03 MED ORDER — PREDNISONE 5 MG PO TABS: 5.0000 mg | ORAL_TABLET | ORAL | 0 refills | Status: AC

## 2023-10-03 MED ORDER — BACLOFEN 10 MG PO TABS: 5.0000 mg | ORAL_TABLET | Freq: Three times a day (TID) | ORAL | 0 refills | Status: AC | PRN

## 2023-10-03 MED ORDER — KETOROLAC TROMETHAMINE 60 MG/2ML IM SOLN
60.0000 mg | Freq: Once | INTRAMUSCULAR | Status: AC
  Administered 2023-10-03: 60 mg via INTRAMUSCULAR

## 2023-10-03 MED ORDER — KETOROLAC TROMETHAMINE 30 MG/ML IJ SOLN: 60.0000 mg | Freq: Once | INTRAMUSCULAR | Status: DC

## 2023-10-03 MED ORDER — KETOROLAC TROMETHAMINE 60 MG/2ML IM SOLN
INTRAMUSCULAR | Status: AC
  Filled 2023-10-03: qty 2

## 2023-10-03 NOTE — ED Provider Notes (Signed)
[facility]    CSN: [id number] Arrival date & time: 10/03/23  1478      History   Chief Complaint No chief complaint on file.   HPI Bailey Bailey is a 32 y.o. female.   Patient presents with concerns of back pain. She reports about 4 days ago she was leaning into the car at an awkward angle to pick up her baby and had acute onset of back pain. She has continued to have low back pain, more on the left, since then. It is constant and worsens with certain positions and movements such as bending. The patient reports some tingling and feelings of numbness into her left leg to the foot. The patient denies prior similar and denies history of back problems but states she did have a spinal injection while having her baby about 7 months ago. She tried taking a leftover oxycodone without much improvement and states it just made her feel sick. She has tried Tylenol with some mild improvement. The patient denies incontinence or saddle anesthesia.  The history is provided by the patient.    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   Hyperkeratosis 04/06/2021   Closed bicondylar fracture of distal humerus 04/24/2019   S/P cesarean section 08/05/2015    Past Surgical History:  Procedure Laterality Date   ARTHROTOMY Right 01/14/2020   Procedure: ARTHROTOMY;  Surgeon: Bjorn Pippin, MD;  Location: [facility];  Service: Orthopedics;  Laterality: Right;   CESAREAN SECTION  04/16/2012   Procedure: CESAREAN SECTION;  Surgeon:  Meriel Pica, MD;  Location: [facility];  Service: Gynecology;  Laterality: N/A;   CESAREAN SECTION N/A 08/03/2015   Procedure: CESAREAN SECTION;  Surgeon: Richarda Overlie, MD;  Location: [facility];  Service: Obstetrics;  Laterality: N/A;  Repeat edc 08/10/15 nkda   CESAREAN SECTION N/A 02/05/2023   Procedure: REPEAT CESAREAN SECTION EDC: 02-12-23  ALLERG: NKDA  PREVIOUS X 2;  Surgeon: Candice Camp, MD;  Location: [facility];  Service: Obstetrics;  Laterality: N/A;   HARDWARE REMOVAL Right 01/14/2020   Procedure: HARDWARE REMOVAL;  Surgeon: Bjorn Pippin, MD;  Location: [facility];  Service: Orthopedics;  Laterality: Right;   ORIF HUMERUS FRACTURE Right 04/24/2019   Procedure: OPEN REDUCTION INTERNAL FIXATION (ORIF) DISTAL HUMERUS FRACTURE;  Surgeon: Bjorn Pippin, MD;  Location: [facility];  Service: Orthopedics;  Laterality: Right;   TONSILLECTOMY     ULNAR NERVE TRANSPOSITION Right 01/14/2020   Procedure: RIGHT ELBOW ARTHROTOMY WITH CAPSULAR EXCISION , REMOVAL RETAINED HARDWARE, NEUROPLASTY AND TRANSPOSITION,ULNAR NERVE AT ELBOW;  Surgeon: Bjorn Pippin, MD;  Location:  SUR[facility]ERY [facility];  Service: Orthopedics;  Laterality: Right;  BLOCK EXPAREL    OB History     Gravida  6   Para  3   Term  3   Preterm  0   AB  3   Living  3      SAB  2   IAB  0   Ectopic  1   Multiple  0   Live Births  3            Home Medications    Prior to Admission medications   Medication Sig Start Date End Date Taking? Authorizing Provider  baclofen (LIORESAL) 10 MG tablet Take 0.5-1 tablets (5-10 mg total) by mouth 3 (three) times daily as needed for muscle spasms. May cause drowsiness. 10/03/23  Yes Eron Goble L, PA  predniSONE (DELTASONE) 5 MG tablet Take 1 tablet (5 mg total) by mouth See admin instructions for 6 days. Day 1: Take 2 tabs at breakfast, 1 at lunch, 1 at dinner, and 2 at bedtime. Day 2: Take one tab at breakfast, 1 at lunch, 1 at dinner, and 2 at bedtime.  Day 3: Take one tab at breakfast, 1 at lunch, 1 at dinner, and 1 at bedtime. Day 4: Take one tab at breakfast, 1 at lunch, and 1 at bedtime. Day 5: Take one tab at breakfast and 1 at bedtime. Day 6: Take one tab at breakfast. 10/03/23 10/09/23 Yes Jakaylee Sasaki L, PA  acetaminophen (TYLENOL) 325 MG tablet Take 2 tablets (650 mg total) by mouth every 6 (six) hours as needed for mild pain (temperature > 101.5.). 02/08/23   Harold Hedge, MD  butalbital-acetaminophen-caffeine (FIORICET) [phone] MG tablet Take 1 tablet by mouth every 4 (four) hours as needed for headache. Patient not taking: Reported on 10/03/2023 02/08/23   Harold Hedge, MD  ibuprofen (ADVIL) 600 MG tablet Take 1 tablet (600 mg total) by mouth every 6 (six) hours as needed. Patient not taking: Reported on 10/03/2023 02/08/23   Harold Hedge, MD  oxyCODONE (OXY IR/ROXICODONE) 5 MG immediate release tablet Take 1 tablet (5 mg total) by mouth every 6 (six) hours as needed for moderate pain. 02/08/23   Harold Hedge, MD  Prenatal Vit-Fe Fumarate-FA (PRENATAL VITAMIN) 27-0.8 MG TABS Take 1 tablet by mouth daily. Patient not taking: Reported on 10/03/2023 06/09/21   Sheila Oats, MD    Family History Family History  Problem Relation Age of Onset   Kidney disease Mother    Migraines Mother    Diabetes Father    Breast cancer Father    Heart disease Maternal Grandmother 81       CHF    Social History Social History   Tobacco Use   Smoking status: Never   Smokeless tobacco: Never  Vaping Use   Vaping status: Never Used  Substance Use Topics   Alcohol use: No   Drug use: No     Allergies   Patient has no known allergies.   Review of Systems Review of Systems  Constitutional:  Negative for fever.  Gastrointestinal:  Negative for abdominal pain.  Musculoskeletal:  Positive for back pain and myalgias. Negative for gait problem.  Skin:  Negative for rash.  Neurological:  Positive for numbness. Negative for dizziness,  weakness and headaches.     Physical Exam Triage Vital Signs ED Triage Vitals  Encounter Vitals Group     BP 10/03/23 0922 106/71     Systolic BP Percentile --      Diastolic BP Percentile --      Pulse Rate 10/03/23 0922 86     Resp 10/03/23 0922 17     Temp 10/03/23 0922 99 F (37.2 C)     Temp Source 10/03/23 0922 Oral     SpO2 10/03/23 0922 97 %  Weight --      Height --      Head Circumference --      Peak Flow --      Pain Score 10/03/23 0923 10     Pain Loc --      Pain Education --      Exclude from Growth Chart --    No data found.  Updated Vital Signs BP 106/71 (BP Location: Left Arm)   Pulse 86   Temp 99 F (37.2 C) (Oral)   Resp 17   LMP 09/29/2023 (Exact Date)   SpO2 97%   Breastfeeding No   Visual Acuity Right Eye Distance:   Left Eye Distance:   Bilateral Distance:    Right Eye Near:   Left Eye Near:    Bilateral Near:     Physical Exam Vitals and nursing note reviewed.  Constitutional:      General: She is not in acute distress. Eyes:     Pupils: Pupils are equal, round, and reactive to light.  Cardiovascular:     Rate and Rhythm: Normal rate and regular rhythm.     Heart sounds: Normal heart sounds.  Pulmonary:     Effort: Pulmonary effort is normal.     Breath sounds: Normal breath sounds.  Musculoskeletal:     Lumbar back: Spasms and tenderness present. Decreased range of motion. Positive left straight leg raise test. Negative right straight leg raise test.     Comments: Tenderness to left lower lumbar with spasms noted. Some midline tenderness without point bony tenderness. No right lumbar tenderness. Reports worsening pain with left SLR. Decreased lumbar ROM due to pain.   Neurological:     Mental Status: She is alert.     Sensory: Sensation is intact.     Gait: Gait abnormal (antalgic, some forward flexion).  Psychiatric:        Mood and Affect: Mood normal.      UC Treatments / Results  Labs (all labs ordered are  listed, but only abnormal results are displayed) Labs Reviewed - No data to display  EKG   Radiology No results found.  Procedures Procedures (including critical care time)  Medications Ordered in UC Medications  ketorolac (TORADOL) 30 MG/ML injection 60 mg (has no administration in time range)    Initial Impression / Assessment and Plan / UC Course  I have reviewed the triage vital signs and the nursing notes.  Pertinent labs & imaging results that were available during my care of the patient were reviewed by me and considered in my medical decision making (see chart for details).     Lumbar strain with sciatica - no s/s of cauda equina. Toradol for acute pain relief. Prednisone pack for sciatica sx and mm relaxer as needed. Discussed other conservative measures and follow-up/ER precautions.   E/M: 1 acute uncomplicated illness, no data, moderate risk due to prescription management  Final Clinical Impressions(s) / UC Diagnoses   Final diagnoses:  Strain of lumbar region, initial encounter  Sciatica of left side     Discharge Instructions      Toradol injection given today to help with pain. Take prednisone as prescribed to help with pain and numbness into your leg. Take baclofen as prescribed as needed - this is a muscle relaxer and may make you sleepy. You can also take Tylenol as directed. Gentle stretching, heat, and massage can help. Follow-up with PCP or ortho if no improvement in a week. Go to the ER  if develop severe worsening pain or new concerning symptoms such as weakness, incontinence, or numbness to your genitals.     ED Prescriptions     Medication Sig Dispense Auth. Provider   baclofen (LIORESAL) 10 MG tablet Take 0.5-1 tablets (5-10 mg total) by mouth 3 (three) times daily as needed for muscle spasms. May cause drowsiness. 21 each Vallery Sa, Artemio Dobie L, PA   predniSONE (DELTASONE) 5 MG tablet Take 1 tablet (5 mg total) by mouth See admin instructions for 6 days.  Day 1: Take 2 tabs at breakfast, 1 at lunch, 1 at dinner, and 2 at bedtime. Day 2: Take one tab at breakfast, 1 at lunch, 1 at dinner, and 2 at bedtime. Day 3: Take one tab at breakfast, 1 at lunch, 1 at dinner, and 1 at bedtime. Day 4: Take one tab at breakfast, 1 at lunch, and 1 at bedtime. Day 5: Take one tab at breakfast and 1 at bedtime. Day 6: Take one tab at breakfast. 21 tablet Vallery Sa, Shilpa Bushee L, PA      PDMP not reviewed this encounter.   Estanislado Pandy, [place] 10/03/23 1003

## 2023-10-03 NOTE — ED Triage Notes (Signed)
Pt c/o back pain that she has had for 4 days after picking daughter up out of car. She has difficulty getting up and down.   She has taken tylenol and oxycodone. Pt states the oxy has not been helping much.

## 2023-10-03 NOTE — Discharge Instructions (Signed)
Toradol injection given today to help with pain. Take prednisone as prescribed to help with pain and numbness into your leg. Take baclofen as prescribed as needed - this is a muscle relaxer and may make you sleepy. You can also take Tylenol as directed. Gentle stretching, heat, and massage can help. Follow-up with PCP or ortho if no improvement in a week. Go to the ER if develop severe worsening pain or new concerning symptoms such as weakness, incontinence, or numbness to your genitals.

## 2024-08-31 ENCOUNTER — Encounter (HOSPITAL_COMMUNITY): Payer: Self-pay | Admitting: *Deleted

## 2024-08-31 ENCOUNTER — Ambulatory Visit (INDEPENDENT_AMBULATORY_CARE_PROVIDER_SITE_OTHER)

## 2024-08-31 ENCOUNTER — Ambulatory Visit (HOSPITAL_COMMUNITY)
Admission: EM | Admit: 2024-08-31 | Discharge: 2024-08-31 | Disposition: A | Discharge status: Home / Self Care | Attending: Family Medicine | Admitting: Family Medicine

## 2024-08-31 DIAGNOSIS — S93601A Unspecified sprain of right foot, initial encounter: Secondary | ICD-10-CM

## 2024-08-31 NOTE — ED Notes (Signed)
 Pt states she is taking her meds daily. Her mom passed within the last month

## 2024-08-31 NOTE — ED Provider Notes (Signed)
 [facility]    CSN: [id number] Arrival date & time: 08/31/24  9071      History   Chief Complaint Chief Complaint  Patient presents with   Foot Pain    HPI Bailey Bailey is a 33 y.o. female.    Foot Pain  Patient is here for right foot pain.  She was wearing heels yesterday, rolled her foot.  Having pain to the right foot;  no ankle pain.  Some swelling is present to the foot;  pain to bear weight as well.  Took left over oxycodone  at home.        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   Hyperkeratosis 04/06/2021   Closed bicondylar fracture of distal humerus 04/24/2019   S/P cesarean section 08/05/2015    Past Surgical History:  Procedure Laterality Date   ARTHROTOMY Right 01/14/2020   Procedure: ARTHROTOMY;  Surgeon: Cristy Bonner ONEIDA, MD;  Location: [facility];  Service: Orthopedics;  Laterality: Right;   CESAREAN SECTION  04/16/2012   Procedure: CESAREAN SECTION;  Surgeon: Charlie CHRISTELLA Croak, MD;  Location: [facility];  Service: Gynecology;  Laterality: N/A;   CESAREAN SECTION N/A 08/03/2015   Procedure: CESAREAN SECTION;  Surgeon: Charlie Croak, MD;  Location: [facility];  Service: Obstetrics;  Laterality: N/A;  Repeat edc 08/10/15 nkda   CESAREAN SECTION N/A 02/05/2023   Procedure: REPEAT CESAREAN SECTION EDC: 02-12-23  ALLERG: NKDA  PREVIOUS X 2;  Surgeon: Marget Lenis, MD;  Location: [facility];  Service: Obstetrics;  Laterality: N/A;   HARDWARE REMOVAL Right 01/14/2020   Procedure: HARDWARE REMOVAL;   Surgeon: Cristy Bonner ONEIDA, MD;  Location: [facility];  Service: Orthopedics;  Laterality: Right;   ORIF HUMERUS FRACTURE Right 04/24/2019   Procedure: OPEN REDUCTION INTERNAL FIXATION (ORIF) DISTAL HUMERUS FRACTURE;  Surgeon: Cristy Bonner ONEIDA, MD;  Location: [facility];  Service: Orthopedics;  Laterality: Right;   TONSILLECTOMY     ULNAR NERVE TRANSPOSITION Right 01/14/2020   Procedure: RIGHT ELBOW ARTHROTOMY WITH CAPSULAR EXCISION , REMOVAL RETAINED HARDWARE, NEUROPLASTY AND TRANSPOSITION,ULNAR NERVE AT ELBOW;  Surgeon: Cristy Bonner ONEIDA, MD;  Location: [facility];  Service: Orthopedics;  Laterality: Right;  BLOCK EXPAREL     OB History     Gravida  6   Para  3   Term  3   Preterm  0   AB  3   Living  3      SAB  2   IAB  0   Ectopic  1   Multiple  0   Live Births  3            Home Medications    Prior to Admission medications   Medication Sig Start Date End Date Taking? Authorizing Provider  sertraline (ZOLOFT) 50 MG tablet Take 50 mg by mouth daily. 07/05/24  Yes [provider]  WEGOVY 1 MG/0.5ML SOAJ SQ injection Inject 1 mg  into the skin once a week. 07/28/24  Yes [provider]  acetaminophen  (TYLENOL ) 325 MG tablet Take 2 tablets (650 mg total) by mouth every 6 (six) hours as needed for mild pain (temperature > 101.5.). 02/08/23   Tomblin, James, MD  baclofen  (LIORESAL ) 10 MG tablet Take 0.5-1 tablets (5-10 mg total) by mouth 3 (three) times daily as needed for muscle spasms. May cause drowsiness. 10/03/23   Vear, Amy L, PA  Prenatal Vit-Fe Fumarate-FA (PRENATAL VITAMIN) 27-0.8 MG TABS Take 1 tablet by mouth daily. Patient not taking: Reported on 10/03/2023 06/09/21   Myrl Therisa BRAVO, MD    Family History Family History  Problem Relation Age of Onset   Kidney disease Mother    Migraines Mother    Diabetes Father    Breast cancer Father    Heart disease Maternal Grandmother 44       CHF    Social History Social  History   Tobacco Use   Smoking status: Never   Smokeless tobacco: Never  Vaping Use   Vaping status: Never Used  Substance Use Topics   Alcohol use: No   Drug use: No     Allergies   Patient has no known allergies.   Review of Systems Review of Systems  Constitutional: Negative.   HENT: Negative.    Respiratory: Negative.    Cardiovascular: Negative.   Gastrointestinal: Negative.   Musculoskeletal:  Positive for joint swelling.     Physical Exam Triage Vital Signs ED Triage Vitals  Encounter Vitals Group     BP 08/31/24 0953 101/60     Girls Systolic BP Percentile --      Girls Diastolic BP Percentile --      Boys Systolic BP Percentile --      Boys Diastolic BP Percentile --      Pulse Rate 08/31/24 0953 75     Resp 08/31/24 0953 18     Temp 08/31/24 0953 98.1 F (36.7 C)     Temp Source 08/31/24 0953 Oral     SpO2 08/31/24 0953 96 %     Weight --      Height --      Head Circumference --      Peak Flow --      Pain Score 08/31/24 0950 10     Pain Loc --      Pain Education --      Exclude from Growth Chart --    No data found.  Updated Vital Signs BP 101/60 (BP Location: Right Arm)   Pulse 75   Temp 98.1 F (36.7 C) (Oral)   Resp 18   LMP 08/15/2024 (Approximate)   SpO2 96%   Visual Acuity Right Eye Distance:   Left Eye Distance:   Bilateral Distance:    Right Eye Near:   Left Eye Near:    Bilateral Near:     Physical Exam Constitutional:      Appearance: Normal appearance. She is normal weight.  Musculoskeletal:     Comments: No obvious swelling or bruising to the right foot;  +TTP to the 2nd, 4th and 5th metatarsal;   no ankle pain or tenderness  Neurological:     General: No focal deficit present.     Mental Status: She is alert.  Psychiatric:        Mood and Affect: Mood normal.      UC Treatments / Results  Labs (all labs ordered are listed, but only abnormal results  are displayed) Labs Reviewed - No data to  display  EKG   Radiology DG Foot Complete Right Result Date: 08/31/2024 CLINICAL DATA:  Right foot pain following a fall and twisting injury yesterday. EXAM: RIGHT FOOT COMPLETE - 3+ VIEW COMPARISON:  None Available. FINDINGS: There is no evidence of fracture or dislocation. There is no evidence of arthropathy or other focal bone abnormality. Soft tissues are unremarkable. IMPRESSION: Negative. Electronically Signed   By: Elspeth Bathe M.D.   On: 08/31/2024 10:17    Procedures Procedures (including critical care time)  Medications Ordered in UC Medications - No data to display  Initial Impression / Assessment and Plan / UC Course  I have reviewed the triage vital signs and the nursing notes.  Pertinent labs & imaging results that were available during my care of the patient were reviewed by me and considered in my medical decision making (see chart for details).   Final Clinical Impressions(s) / UC Diagnoses   Final diagnoses:  Sprain of right foot, initial encounter     Discharge Instructions      You were seen today for foot pain after injury.  Your xray was negative today for fracture. You have likely sprained the foot.  I have given you a boot for comfort.  I recommend you stay off of the foot and keep elevated.  You may use motrin  600mg  for pain, and ice the foot.  Please follow up with your primary care provider if not improving.     ED Prescriptions   None    PDMP not reviewed this encounter.   Darral Longs, MD 08/31/24 1034

## 2024-08-31 NOTE — Discharge Instructions (Signed)
 You were seen today for foot pain after injury.  Your xray was negative today for fracture. You have likely sprained the foot.  I have given you a boot for comfort.  I recommend you stay off of the foot and keep elevated.  You may use motrin  600mg  for pain, and ice the foot.  Please follow up with your primary care provider if not improving.

## 2024-08-31 NOTE — ED Triage Notes (Signed)
 Pt states she wore heels yesterday and fell twisted her right foot. She took oxycodone  last night which helped but she states she took  a whole lot
